# Patient Record
Sex: Male | Born: 1945 | Race: White | Hispanic: No | Marital: Married | Smoking: Never smoker
Health system: Southern US, Community
[De-identification: ages and names within clinical notes are randomized; demographics above are authoritative.]

## PROBLEM LIST (undated history)

## (undated) DIAGNOSIS — R160 Hepatomegaly, not elsewhere classified: Secondary | ICD-10-CM

## (undated) DIAGNOSIS — R55 Syncope and collapse: Secondary | ICD-10-CM

## (undated) DIAGNOSIS — R0602 Shortness of breath: Secondary | ICD-10-CM

## (undated) DIAGNOSIS — C801 Malignant (primary) neoplasm, unspecified: Secondary | ICD-10-CM

## (undated) DIAGNOSIS — IMO0001 Reserved for inherently not codable concepts without codable children: Secondary | ICD-10-CM

## (undated) DIAGNOSIS — IMO0002 Reserved for concepts with insufficient information to code with codable children: Secondary | ICD-10-CM

## (undated) DIAGNOSIS — I959 Hypotension, unspecified: Secondary | ICD-10-CM

## (undated) DIAGNOSIS — M1990 Unspecified osteoarthritis, unspecified site: Secondary | ICD-10-CM

## (undated) HISTORY — DX: Hypotension, unspecified: I95.9

## (undated) HISTORY — DX: Syncope and collapse: R55

---

## 1997-12-14 ENCOUNTER — Ambulatory Visit (HOSPITAL_COMMUNITY): Admission: RE | Admit: 1997-12-14 | Discharge: 1997-12-14 | Payer: Self-pay | Admitting: *Deleted

## 2012-04-21 ENCOUNTER — Inpatient Hospital Stay (HOSPITAL_COMMUNITY)
Admission: RE | Admit: 2012-04-21 | Discharge: 2012-04-26 | DRG: 481 | Disposition: A | Discharge status: Rehabilitation Facility | Payer: 59 | Source: Ambulatory Visit | Attending: Orthopedic Surgery | Admitting: Orthopedic Surgery

## 2012-04-21 ENCOUNTER — Inpatient Hospital Stay (HOSPITAL_COMMUNITY): Payer: 59

## 2012-04-21 ENCOUNTER — Other Ambulatory Visit: Payer: Self-pay | Admitting: Orthopedic Surgery

## 2012-04-21 DIAGNOSIS — C184 Malignant neoplasm of transverse colon: Secondary | ICD-10-CM | POA: Diagnosis present

## 2012-04-21 DIAGNOSIS — C7951 Secondary malignant neoplasm of bone: Secondary | ICD-10-CM | POA: Diagnosis present

## 2012-04-21 DIAGNOSIS — R718 Other abnormality of red blood cells: Secondary | ICD-10-CM | POA: Diagnosis present

## 2012-04-21 DIAGNOSIS — M84453A Pathological fracture, unspecified femur, initial encounter for fracture: Principal | ICD-10-CM | POA: Diagnosis present

## 2012-04-21 HISTORY — DX: Malignant (primary) neoplasm, unspecified: C80.1

## 2012-04-21 LAB — CBC WITH DIFFERENTIAL/PLATELET
Basophils Absolute: 0.1 10*3/uL (ref 0.0–0.1)
Eosinophils Relative: 0 % (ref 0–5)
HCT: 42.3 % (ref 39.0–52.0)
Hemoglobin: 13.7 g/dL (ref 13.0–17.0)
Hemoglobin: 12.6 g/dL — ABNORMAL LOW (ref 13.0–17.0)
Lymphocytes Relative: 11 % — ABNORMAL LOW (ref 12–46)
Lymphocytes Relative: 16 % (ref 12–46)
Lymphs Abs: 1.6 10*3/uL (ref 0.7–4.0)
MCV: 69.9 fL — ABNORMAL LOW (ref 78.0–100.0)
MCV: 70.1 fL — ABNORMAL LOW (ref 78.0–100.0)
Monocytes Absolute: 0.7 10*3/uL (ref 0.1–1.0)
Monocytes Relative: 7 % (ref 3–12)
Neutrophils Relative %: 73 % (ref 43–77)
Platelets: 281 10*3/uL (ref 150–400)
RBC: 5.56 MIL/uL (ref 4.22–5.81)
RDW: 18.8 % — ABNORMAL HIGH (ref 11.5–15.5)
WBC: 10.5 10*3/uL (ref 4.0–10.5)
WBC: 10.1 10*3/uL (ref 4.0–10.5)

## 2012-04-21 LAB — URINALYSIS, ROUTINE W REFLEX MICROSCOPIC
Glucose, UA: NEGATIVE mg/dL
Hgb urine dipstick: NEGATIVE
Ketones, ur: 40 mg/dL — AB
Protein, ur: NEGATIVE mg/dL

## 2012-04-21 LAB — COMPREHENSIVE METABOLIC PANEL
ALT: 14 U/L (ref 0–53)
AST: 21 U/L (ref 0–37)
Alkaline Phosphatase: 74 U/L (ref 39–117)
BUN: 20 mg/dL (ref 6–23)
CO2: 24 mEq/L (ref 19–32)
CO2: 25 mEq/L (ref 19–32)
Calcium: 10 mg/dL (ref 8.4–10.5)
Creatinine, Ser: 1.07 mg/dL (ref 0.50–1.35)
GFR calc Af Amer: 82 mL/min — ABNORMAL LOW (ref >90)
GFR calc Af Amer: >90 mL/min (ref >90)
GFR calc non Af Amer: 70 mL/min — ABNORMAL LOW (ref >90)
GFR calc non Af Amer: 85 mL/min — ABNORMAL LOW (ref >90)
Glucose, Bld: 96 mg/dL (ref 70–99)
Glucose, Bld: 92 mg/dL (ref 70–99)
Potassium: 3.7 mEq/L (ref 3.5–5.1)
Sodium: 138 mEq/L (ref 135–145)
Total Bilirubin: 0.6 mg/dL (ref 0.3–1.2)
Total Bilirubin: 0.6 mg/dL (ref 0.3–1.2)

## 2012-04-21 LAB — TYPE AND SCREEN
ABO/RH(D): A POS
Antibody Screen: NEGATIVE

## 2012-04-21 MED ORDER — POVIDONE-IODINE 7.5 % EX SOLN
Freq: Once | CUTANEOUS | Status: DC
  Filled 2012-04-21: qty 118

## 2012-04-21 MED ORDER — IOHEXOL 300 MG/ML  SOLN
100.0000 mL | Freq: Once | INTRAMUSCULAR | Status: AC | PRN
  Administered 2012-04-21: 100 mL via INTRAVENOUS

## 2012-04-21 MED ORDER — CEFAZOLIN SODIUM-DEXTROSE 2-3 GM-% IV SOLR
2.0000 g | INTRAVENOUS | Status: AC
  Administered 2012-04-22: 2 g via INTRAVENOUS
  Filled 2012-04-21: qty 50

## 2012-04-21 MED ORDER — SODIUM CHLORIDE 0.9 % IV SOLN: INTRAVENOUS | Status: DC

## 2012-04-21 MED ORDER — IOHEXOL 300 MG/ML  SOLN
20.0000 mL | INTRAMUSCULAR | Status: AC
  Administered 2012-04-21: 20 mL via ORAL

## 2012-04-21 MED ORDER — HYDROCODONE-ACETAMINOPHEN 5-325 MG PO TABS
1.0000 | ORAL_TABLET | Freq: Four times a day (QID) | ORAL | Status: DC | PRN
  Administered 2012-04-21: 1 via ORAL
  Filled 2012-04-21: qty 2

## 2012-04-21 MED ORDER — MORPHINE SULFATE 2 MG/ML IJ SOLN
0.5000 mg | INTRAMUSCULAR | Status: DC | PRN
  Administered 2012-04-21: 0.5 mg via INTRAVENOUS
  Filled 2012-04-21: qty 1

## 2012-04-21 NOTE — Progress Notes (Signed)
COURTESY NOTE: Oriented this 67 y/o [place] man and his wife and son to a possible diagnosis of myeloma. He understands the purpose of the surgery tomorrow is not to cure his cancer but to diagnose it and to fix his hip so he can walk without pain or with minimal pain. The lab work (SPEP and UPEP) and pathology likely will not be ready until weds. I will see the patient at that time and once a firm diagnosis is established I will complete the consult or refer to one of my partners who may sub-specialize in the patient's tumor type. Please let me know if I can be of help before then.

## 2012-04-21 NOTE — Progress Notes (Signed)
CARE MANAGEMENT NOTE 04/21/2012  Patient:  Justin Henson, Justin Henson   Account Number:  [id number]  Date Initiated:  04/21/2012  Documentation initiated by:  Vance Peper  Subjective/Objective Assessment:   67 yr old male adm for eval of right hip pain. Ortho MD and Oncologist will confer to determine plan of treatment.     Action/Plan:   CM will follow.  Status of service:  In process, will continue to follow

## 2012-04-21 NOTE — H&P (Signed)
PREOPERATIVE H&P  Chief Complaint: right hip pain  HPI: Justin Henson is a 67 y.o. male who presents for evaluation of right hip pain. It has been present for about 6 weeks and has been worsening. the patient was evaluated in my office about 6 weeks ago with significant right hip pain.  The x-rays showed an avulsion of the lesser trochanter and there was concern that this was causing his pain.  After 6 weeks of continued and ongoing pain and failure of his pain to resolve, the patient underwent MRI examination of the right hip.  The MRI examination unfortunately shows a large metastatic lesion in the proximal right femur.  There is also significant other lesions in the pelvis.  I had a long discussion with the oncologist and we both felt that giving the impending nature of fracture for this lesion that the patient should be admitted to undergo workup for his tumor and prophylactic nailing of the right hip. Pain is rated as severe.  No past medical history on file. No past surgical history on file. History   Social History  . Marital Status: Married    Spouse Name: N/A    Number of Children: N/A  . Years of Education: N/A   Social History Main Topics  . Smoking status: Not on file  . Smokeless tobacco: Not on file  . Alcohol Use: Not on file  . Drug Use: Not on file  . Sexually Active: Not on file   Other Topics Concern  . Not on file   Social History Narrative  . No narrative on file   No family history on file. Allergies not on file Prior to Admission medications   Not on File     Positive ROS: ROS: I have reviewed the patient's review of systems thoroughly and there are no positive responses as relates to the HPI.  All other systems have been reviewed and were otherwise negative with the exception of those mentioned in the HPI and as above.  Physical Exam: Filed Vitals:   04/21/12 1236  BP: 128/67  Pulse: 83  Temp: 98.2 F (36.8 C)  Resp: 18    General:  Alert, no acute distress Cardiovascular: No pedal edema Respiratory: No cyanosis, no use of accessory musculature GI: No organomegaly, abdomen is soft and non-tender Skin: No lesions in the area of chief complaint Neurologic: Sensation intact distally Psychiatric: Patient is competent for consent with normal mood and affect Lymphatic: No axillary or cervical lymphadenopathy  MUSCULOSKELETAL: painful rom r. Hip.  X-ray:plain x-ray shows a avulsion of lesser trochanter with a questionable permeative picture in the right proximal femur.  MRI right hip shows a large metastatic lesion in the proximal femur with other lesions in the iliac wings bilaterally.  Assessment/Plan: IMPENDING FRACTURE RIGHT HIP .  The patient has a new diagnosis of metastatic disease right hip.  He has an impending fracture of this right hip.  I discussed the case with the oncologist and we both feel that the most appropriate course of action will be admission and evaluation of his metastatic disease.  Once we are certain that he does not have a renal cell carcinoma we can proceed with intramedullary rodding right hip.  If we cannot make a  diagnosisbased on CT scan then we will proceed with IM rodding of right hip and plan to make a tissue diagnosis from the reamings.  If we're able to make the diagnosis and we will proceed with prophylactic nailing and  send the reamings as well for confirmation.  If it turns out to be a renal cell primary and he will need preoperative embolization. Plan for Procedure(s): INTRAMEDULLARY (IM) NAIL FEMORAL  The risks benefits and alternatives were discussed with the patient including but not limited to the risks of nonoperative treatment, versus surgical intervention including infection, bleeding, nerve injury, malunion, nonunion, hardware prominence, hardware failure, need for hardware removal, blood clots, cardiopulmonary complications, morbidity, mortality, among others, and they were willing  to proceed.  Predicted outcome is good, although there will be at least a six to nine month expected recovery.  Harvie Junior, MD 04/21/2012 1:45 PM

## 2012-04-22 ENCOUNTER — Inpatient Hospital Stay (HOSPITAL_COMMUNITY): Payer: 59 | Admitting: Anesthesiology

## 2012-04-22 ENCOUNTER — Encounter (HOSPITAL_COMMUNITY)
Admission: RE | Disposition: A | Discharge status: Rehabilitation Facility | Payer: Self-pay | Source: Ambulatory Visit | Attending: Orthopedic Surgery

## 2012-04-22 ENCOUNTER — Encounter (HOSPITAL_COMMUNITY): Payer: Self-pay | Admitting: Anesthesiology

## 2012-04-22 ENCOUNTER — Inpatient Hospital Stay (HOSPITAL_COMMUNITY): Payer: 59

## 2012-04-22 DIAGNOSIS — C801 Malignant (primary) neoplasm, unspecified: Secondary | ICD-10-CM

## 2012-04-22 DIAGNOSIS — M84453A Pathological fracture, unspecified femur, initial encounter for fracture: Principal | ICD-10-CM

## 2012-04-22 DIAGNOSIS — M25559 Pain in unspecified hip: Secondary | ICD-10-CM

## 2012-04-22 DIAGNOSIS — R718 Other abnormality of red blood cells: Secondary | ICD-10-CM

## 2012-04-22 HISTORY — PX: FEMUR IM NAIL: SHX1597

## 2012-04-22 LAB — SURGICAL PCR SCREEN: MRSA, PCR: NEGATIVE

## 2012-04-22 LAB — CEA: CEA: 1.7 ng/mL (ref 0.0–5.0)

## 2012-04-22 SURGERY — INSERTION, INTRAMEDULLARY ROD, FEMUR
Anesthesia: General | Site: Hip | Laterality: Right | Wound class: Clean

## 2012-04-22 MED ORDER — LACTATED RINGERS IV SOLN
INTRAVENOUS | Status: DC | PRN
  Administered 2012-04-22: 07:00:00 via INTRAVENOUS

## 2012-04-22 MED ORDER — DEXTROSE-NACL 5-0.45 % IV SOLN
INTRAVENOUS | Status: DC
  Administered 2012-04-22 – 2012-04-24 (×3): via INTRAVENOUS

## 2012-04-22 MED ORDER — HYDROCODONE-ACETAMINOPHEN 10-325 MG PO TABS
1.0000 | ORAL_TABLET | ORAL | Status: DC | PRN
  Administered 2012-04-22 (×3): 1 via ORAL
  Administered 2012-04-24: 2 via ORAL
  Filled 2012-04-22 (×2): qty 1
  Filled 2012-04-22 (×2): qty 2

## 2012-04-22 MED ORDER — ONDANSETRON HCL 4 MG PO TABS: 4.0000 mg | ORAL_TABLET | Freq: Four times a day (QID) | ORAL | Status: DC | PRN

## 2012-04-22 MED ORDER — FENTANYL CITRATE 0.05 MG/ML IJ SOLN
INTRAMUSCULAR | Status: DC | PRN
  Administered 2012-04-22 (×2): 75 ug via INTRAVENOUS
  Administered 2012-04-22: 100 ug via INTRAVENOUS

## 2012-04-22 MED ORDER — ALUM & MAG HYDROXIDE-SIMETH 200-200-20 MG/5ML PO SUSP: 30.0000 mL | ORAL | Status: DC | PRN

## 2012-04-22 MED ORDER — NEOSTIGMINE METHYLSULFATE 1 MG/ML IJ SOLN
INTRAMUSCULAR | Status: DC | PRN
  Administered 2012-04-22: 3 mg via INTRAVENOUS

## 2012-04-22 MED ORDER — ONDANSETRON HCL 4 MG/2ML IJ SOLN: 4.0000 mg | Freq: Once | INTRAMUSCULAR | Status: DC | PRN

## 2012-04-22 MED ORDER — ONDANSETRON HCL 4 MG/2ML IJ SOLN
4.0000 mg | Freq: Four times a day (QID) | INTRAMUSCULAR | Status: DC | PRN
  Administered 2012-04-26: 4 mg via INTRAVENOUS
  Filled 2012-04-22: qty 2

## 2012-04-22 MED ORDER — MIDAZOLAM HCL 5 MG/5ML IJ SOLN
INTRAMUSCULAR | Status: DC | PRN
  Administered 2012-04-22: 2 mg via INTRAVENOUS

## 2012-04-22 MED ORDER — ONDANSETRON HCL 4 MG/2ML IJ SOLN
INTRAMUSCULAR | Status: DC | PRN
  Administered 2012-04-22: 4 mg via INTRAVENOUS

## 2012-04-22 MED ORDER — LORATADINE 10 MG PO TABS
10.0000 mg | ORAL_TABLET | Freq: Every day | ORAL | Status: DC
  Filled 2012-04-22 (×3): qty 1

## 2012-04-22 MED ORDER — ENOXAPARIN SODIUM 40 MG/0.4ML ~~LOC~~ SOLN
40.0000 mg | SUBCUTANEOUS | Status: DC
  Administered 2012-04-23 – 2012-04-24 (×2): 40 mg via SUBCUTANEOUS
  Filled 2012-04-22 (×3): qty 0.4

## 2012-04-22 MED ORDER — HYDROMORPHONE HCL PF 1 MG/ML IJ SOLN
1.0000 mg | INTRAMUSCULAR | Status: DC | PRN
  Administered 2012-04-22 – 2012-04-24 (×4): 1 mg via INTRAVENOUS
  Filled 2012-04-22 (×3): qty 1
  Filled 2012-04-22: qty 2

## 2012-04-22 MED ORDER — METHOCARBAMOL 500 MG PO TABS
500.0000 mg | ORAL_TABLET | Freq: Four times a day (QID) | ORAL | Status: DC | PRN
  Administered 2012-04-22 – 2012-04-26 (×3): 500 mg via ORAL
  Filled 2012-04-22 (×3): qty 1

## 2012-04-22 MED ORDER — PHENYLEPHRINE HCL 10 MG/ML IJ SOLN
INTRAMUSCULAR | Status: DC | PRN
  Administered 2012-04-22: 80 ug via INTRAVENOUS

## 2012-04-22 MED ORDER — ARTIFICIAL TEARS OP OINT
TOPICAL_OINTMENT | OPHTHALMIC | Status: DC | PRN
  Administered 2012-04-22: 1 via OPHTHALMIC

## 2012-04-22 MED ORDER — SODIUM CHLORIDE 0.9 % IV SOLN: INTRAVENOUS | Status: DC

## 2012-04-22 MED ORDER — GLYCOPYRROLATE 0.2 MG/ML IJ SOLN
INTRAMUSCULAR | Status: DC | PRN
  Administered 2012-04-22: 0.4 mg via INTRAVENOUS

## 2012-04-22 MED ORDER — HYDROMORPHONE HCL PF 1 MG/ML IJ SOLN
INTRAMUSCULAR | Status: AC
  Administered 2012-04-23: 1 mg via INTRAVENOUS
  Filled 2012-04-22: qty 2

## 2012-04-22 MED ORDER — PROPOFOL 10 MG/ML IV BOLUS
INTRAVENOUS | Status: DC | PRN
  Administered 2012-04-22: 160 mg via INTRAVENOUS

## 2012-04-22 MED ORDER — FERROUS SULFATE 325 (65 FE) MG PO TABS
325.0000 mg | ORAL_TABLET | Freq: Two times a day (BID) | ORAL | Status: DC
  Administered 2012-04-22 – 2012-04-24 (×4): 325 mg via ORAL
  Filled 2012-04-22 (×7): qty 1

## 2012-04-22 MED ORDER — ROCURONIUM BROMIDE 100 MG/10ML IV SOLN
INTRAVENOUS | Status: DC | PRN
  Administered 2012-04-22: 50 mg via INTRAVENOUS

## 2012-04-22 MED ORDER — CEFAZOLIN SODIUM-DEXTROSE 2-3 GM-% IV SOLR
2.0000 g | Freq: Four times a day (QID) | INTRAVENOUS | Status: AC
Start: 2012-04-22 — End: 2012-04-22
  Administered 2012-04-22 (×2): 2 g via INTRAVENOUS
  Filled 2012-04-22 (×2): qty 50

## 2012-04-22 MED ORDER — ZOLPIDEM TARTRATE 5 MG PO TABS
5.0000 mg | ORAL_TABLET | Freq: Every evening | ORAL | Status: DC | PRN
Start: 2012-04-22 — End: 2012-04-26

## 2012-04-22 MED ORDER — ACETAMINOPHEN 10 MG/ML IV SOLN
1000.0000 mg | Freq: Once | INTRAVENOUS | Status: AC | PRN
  Administered 2012-04-22: 1000 mg via INTRAVENOUS

## 2012-04-22 MED ORDER — OXYCODONE HCL 5 MG PO TABS
5.0000 mg | ORAL_TABLET | ORAL | Status: DC | PRN
  Administered 2012-04-22: 10 mg via ORAL
  Filled 2012-04-22: qty 2

## 2012-04-22 MED ORDER — LORATADINE-PSEUDOEPHEDRINE ER 10-240 MG PO TB24: 1.0000 | ORAL_TABLET | Freq: Every day | ORAL | Status: DC

## 2012-04-22 MED ORDER — HYDROMORPHONE HCL PF 1 MG/ML IJ SOLN
0.2500 mg | INTRAMUSCULAR | Status: DC | PRN
  Administered 2012-04-22 (×4): 0.5 mg via INTRAVENOUS

## 2012-04-22 MED ORDER — DOCUSATE SODIUM 100 MG PO CAPS
100.0000 mg | ORAL_CAPSULE | Freq: Two times a day (BID) | ORAL | Status: DC
  Administered 2012-04-22 – 2012-04-26 (×5): 100 mg via ORAL
  Filled 2012-04-22 (×9): qty 1

## 2012-04-22 MED ORDER — MIDAZOLAM HCL 2 MG/2ML IJ SOLN: 2.0000 mg | Freq: Once | INTRAMUSCULAR | Status: DC

## 2012-04-22 MED ORDER — LIDOCAINE HCL (CARDIAC) 20 MG/ML IV SOLN
INTRAVENOUS | Status: DC | PRN
  Administered 2012-04-22: 100 mg via INTRAVENOUS

## 2012-04-22 MED ORDER — PSEUDOEPHEDRINE HCL ER 120 MG PO TB12
120.0000 mg | ORAL_TABLET | Freq: Two times a day (BID) | ORAL | Status: DC
  Administered 2012-04-22 – 2012-04-23 (×2): 120 mg via ORAL
  Filled 2012-04-22 (×7): qty 1

## 2012-04-22 MED ORDER — ACETAMINOPHEN 10 MG/ML IV SOLN
1000.0000 mg | Freq: Four times a day (QID) | INTRAVENOUS | Status: AC
  Administered 2012-04-22 – 2012-04-23 (×4): 1000 mg via INTRAVENOUS
  Filled 2012-04-22 (×4): qty 100

## 2012-04-22 MED ORDER — DEXTROSE 5 % IV SOLN
500.0000 mg | Freq: Four times a day (QID) | INTRAVENOUS | Status: DC | PRN
  Filled 2012-04-22 (×2): qty 5

## 2012-04-22 MED ORDER — DEXMEDETOMIDINE HCL IN NACL 200 MCG/50ML IV SOLN
0.4000 ug/kg/h | INTRAVENOUS | Status: DC
  Filled 2012-04-22: qty 50

## 2012-04-22 SURGICAL SUPPLY — 42 items
Affixus hip fracture nail right 130 13mmx440mm ×2 IMPLANT
BIT DRILL 4.3MMS DISTAL GRDTED (BIT) ×1 IMPLANT
BLADE SURG ROTATE 9660 (MISCELLANEOUS) ×2 IMPLANT
CLOTH BEACON ORANGE TIMEOUT ST (SAFETY) ×2 IMPLANT
CORTICAL BONE SCR 5.0MM X 48MM (Screw) ×2 IMPLANT
COVER SURGICAL LIGHT HANDLE (MISCELLANEOUS) ×4 IMPLANT
DECANTER SPIKE VIAL GLASS SM (MISCELLANEOUS) ×2 IMPLANT
DRAPE STERI IOBAN 125X83 (DRAPES) ×2 IMPLANT
DRILL 4.3MMS DISTAL GRADUATED (BIT) ×2
DRSG MEPILEX BORDER 4X4 (GAUZE/BANDAGES/DRESSINGS) ×2 IMPLANT
DRSG MEPILEX BORDER 4X8 (GAUZE/BANDAGES/DRESSINGS) ×2 IMPLANT
DURAPREP 26ML APPLICATOR (WOUND CARE) ×2 IMPLANT
ELECT CAUTERY BLADE 6.4 (BLADE) ×2 IMPLANT
ELECT REM PT RETURN 9FT ADLT (ELECTROSURGICAL) ×2
ELECTRODE REM PT RTRN 9FT ADLT (ELECTROSURGICAL) ×1 IMPLANT
EVACUATOR 1/8 PVC DRAIN (DRAIN) IMPLANT
GAUZE XEROFORM 5X9 LF (GAUZE/BANDAGES/DRESSINGS) ×2 IMPLANT
GLOVE BIOGEL PI IND STRL 8 (GLOVE) ×2 IMPLANT
GLOVE BIOGEL PI INDICATOR 8 (GLOVE) ×2
GLOVE ECLIPSE 7.5 STRL STRAW (GLOVE) ×4 IMPLANT
GOWN PREVENTION PLUS XLARGE (GOWN DISPOSABLE) ×2 IMPLANT
GOWN SRG XL XLNG 56XLVL 4 (GOWN DISPOSABLE) ×1 IMPLANT
GOWN STRL NON-REIN LRG LVL3 (GOWN DISPOSABLE) ×2 IMPLANT
GOWN STRL NON-REIN XL XLG LVL4 (GOWN DISPOSABLE) ×1
GUIDEWIRE BALL NOSE 80CM (WIRE) ×2 IMPLANT
HIP FR NAIL LAG SCREW 10.5X110 (Orthopedic Implant) ×2 IMPLANT
KIT BASIN OR (CUSTOM PROCEDURE TRAY) ×2 IMPLANT
KIT ROOM TURNOVER OR (KITS) ×2 IMPLANT
MANIFOLD NEPTUNE II (INSTRUMENTS) ×2 IMPLANT
NS IRRIG 1000ML POUR BTL (IV SOLUTION) ×2 IMPLANT
PACK GENERAL/GYN (CUSTOM PROCEDURE TRAY) ×2 IMPLANT
PAD ARMBOARD 7.5X6 YLW CONV (MISCELLANEOUS) ×4 IMPLANT
SCREW BONE CORTICAL 5.0X52 (Screw) ×2 IMPLANT
SCREW CORTICL BON 5.0MM X 48MM (Screw) ×1 IMPLANT
SCREW LAG HIP FR NAIL 10.5X110 (Orthopedic Implant) ×1 IMPLANT
STAPLER VISISTAT 35W (STAPLE) ×2 IMPLANT
SUT VIC AB 0 CTB1 27 (SUTURE) ×4 IMPLANT
SUT VIC AB 1 CTB1 27 (SUTURE) ×2 IMPLANT
SUT VIC AB 2-0 CTB1 (SUTURE) ×2 IMPLANT
TOWEL OR 17X24 6PK STRL BLUE (TOWEL DISPOSABLE) ×2 IMPLANT
TOWEL OR 17X26 10 PK STRL BLUE (TOWEL DISPOSABLE) ×2 IMPLANT
WATER STERILE IRR 1000ML POUR (IV SOLUTION) ×2 IMPLANT

## 2012-04-22 NOTE — Consult Note (Signed)
[facility]  [phone]    ONCOLOGY  HOSPITAL CONSULTATION NOTE  Justin Justin Henson                                MR#: [id number]  DOB: 27-Jan-1946                       CSN#: [id number]  Referring NW:[facility] Primary MD: Rozann Lesches  Reason for Consult: Pathologic Fracture   Justin Henson:QMVHQI E Justin Henson is a 67 y.o. [place]  male admitted on 04/21/2012 by Dr.John Graves, Orthopedics, after he presented with 6 weeks history of worsening R hip pain. Initial R hip x ray demonstrated lesser trochanter avulsion. Pain was not resolved, which prompted MRI of the R hip. This revealed a large metastatic lesion in the right proximal femur, along with other osseous lesions within the pelvis. He underwent R intramedullary femoral  nailing on 04/22/2012. Sample was sent for pathology which results are currently pending.  CT of the abdomen and pelvis on 04/21/2012 showed a large mass within the proximal transverse colon measuring  7.4 x 4.4 x 4.8 cm.  No obstruction. Again seen,  an expansile lytic lesion involving the proximal right femur at the level of the lesser trochanter of  4.8 x 4.3 x 5.5 cm. No additional suspicious bone lesions identified.   Small nonspecific pulmonary nodules measure up to 5 mm and a  Focal lytic lesion is identified within the manubrium of 1 cm . 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 Creatinine was 0.95   Alk P is 74.Ca was 9.5  PSA N/A . CEA 1.7    We were requested to see the patient with recommendations.  He has no complaint aside from the right leg pain.  PMH: History of basal cell carcinoma at the nose, s/p resection, remote.  Surgeries:  S/ IM prophylactic R femoral nail 04/21/2012 Dr. Margo Aye  Allergies: No Known Allergies  Medications:   Prior to Admission:  Prescriptions prior to admission    Medication Sig Dispense Refill  . loratadine-pseudoephedrine (CLARITIN-D 24-HOUR) 10-240 MG per 24 hr tablet Take 1 tablet by mouth daily.      Marland Kitchen oxyCODONE-acetaminophen (PERCOCET/ROXICET) 5-325 MG per tablet Take 1 tablet by mouth every 6 (six) hours as needed. For pain        ONG:EXBM & mag hydroxide-simeth, HYDROcodone-acetaminophen, HYDROmorphone (DILAUDID) injection, methocarbamol (ROBAXIN) IV, methocarbamol, ondansetron (ZOFRAN) IV, ondansetron, zolpidem  ROS: Constitutional: Positive for 23 lb weight loss due to decreased appetite over the last 2 months.. Negative for fever, chills or  night sweats.  Eyes: Negative for blurred vision and double vision.  Respiratory: Negative for productive cough. No hemoptysis.No hoarseness.No neck swelling.  Positive for shortness of breath on exertion. No pleuritic chest pain.  Cardiovascular: Negative for chest pain. No palpitations.  GI: Negative for  nausea, vomiting, diarrhea or constipation. No change in bowel caliber. No  Melena or hematochezia. Positive for  abdominal pain.  GU: Negative for hematuria. No loss of urinary control. No urinary retention.No changes in urine color.  Skin: Negative for itching. No rash. No petechia. No easy  Bruising. Musculoskeletal: as per HPI Neurological: No headaches.No confusion. No motor or sensory deficits. Psych: No depression. No suicidal thoughts.  Family History:    Mother alive with COPD Father alive, heart disease, history of skin cancer at the left face  No other family history of cancer     Social History: Married, one son in good health. Works as a Cytogeneticist. No tobacco or alcohol history. No risk factor for HIV or hepatitis. 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   Physical Exam    Filed Vitals:   04/22/12 1115  BP: 111/54  Pulse: 63  Temp:   Resp: 21      General: 67  -year-old  in no acute distress A. and O. x3  well-developed, well-nourished.   HEENT:  Normocephalic, atraumatic, PERRLA. Sclerae anicteric. Oral cavity without thrush or lesions. NECK:supple. no thyromegaly, no mass LUNGS: clear bilaterally . No wheezing, rhonchi or rales. No axillary masses. BREASTS: not examined. CARDIOVASCULAR: regular rate and rhythm, no murmur , rubs or gallops ABDOMEN: soft nontender, bowel sounds x4. No HSM. No masses palpable.  GU-testes without mass, slight fullness at the right epididymis EXTREMITIES: no clubbing cyanosis or edema. R hip s/p surgical nailing. Dressings dry NEURO: Non Focal. No Horner's.  Lymph nodes: No cervical, supraclavicular, axillary, or inguinal nodes  Skin: Multiple benign appearing moles over the trunk. Multiple skin tags in the axillae bilaterally   Labs:  CBC   Lab 04/21/12 1904 04/21/12 1306  WBC 10.1 10.5  HGB 12.6* 13.7  HCT 39.0 42.3  PLT 281 290  MCV 70.1* 69.9*  MCH 22.7* 22.6*  MCHC 32.3 32.4  RDW 18.6* 18.8*  LYMPHSABS 1.6 1.1  MONOABS 1.0 0.7  EOSABS 0.1 0.0  BASOSABS 0.0 0.1  BANDABS -- --     CMP    Lab 04/21/12 1904 04/21/12 1306  NA 138 139  K 3.7 3.8  CL 99 99  CO2 25 24  GLUCOSE 92 96  BUN 16 20  CREATININE 0.95 1.07  CALCIUM 9.5 10.0  MG -- --  AST 18 21  ALT 14 14  ALKPHOS 74 81  BILITOT 0.6 0.6   CEA 1.7 on 04/21/2012   Imaging Studies:   R femur1/10/2012  *RADIOLOGY REPORT*  Clinical Data: NAIL PLACEMENT.  RIGHT FEMUR - 2 VIEW,DG C-ARM 1-60 MIN  Comparison: MRI 04/18/2012  Findings: Multiple intraoperative spot images demonstrate placement of intermedullary nail and dynamic hip screw within the right femur.  No hardware or bony complicating feature.  IMPRESSION: Internal fixation as above.   Original Report Authenticated By: Charlett Nose, M.D.    Ct Chest W Contrast  04/21/2012  *RADIOLOGY REPORT*  Clinical Data:  Metastatic survey  CT CHEST, ABDOMEN AND PELVIS WITH CONTRAST  Technique:  Multidetector CT imaging of the chest, abdomen and pelvis was performed following the  standard protocol during bolus administration of intravenous contrast.  Contrast: OMNIPAQUE IOHEXOL 300 MG/ML  SOLN  Comparison:  04/18/2012  CT CHEST  Findings:  Lungs/pleura: Nonspecific subpleural nodule in the right upper lobe measures 5 mm, image 41. Parenchymal nodule in the right upper lobe measures 5 mm, image 33.  Left lower lobe subpleural nodule measures 5 mm, image 42.  Dependent changes are noted in both lung bases posteriorly.  Heart/Mediastinum: Normal heart size.  No pericardial effusion.  No mediastinal or hilar adenopathy.  Bones/Musculoskeletal:  Small lytic lesion is identified within the manubrium.  This measures approximately 1 cm, image 21/series 3. Lucent lesion within the T9 vertebra measures 10 mm, which is favored to represent a benign vertebral hemangioma.  IMPRESSION:  1.  Small nonspecific pulmonary nodules measure up to 5 mm. 2.  Focal lytic lesion is identified within the manubrium.  CT ABDOMEN AND PELVIS  Findings:  Mild diffuse low attenuation within the liver parenchyma identified.  No this suspicious liver abnormality noted.  1.1 cm stone noted within the gallbladder.  No biliary dilatation.  The pancreas appears normal.  Normal appearance of the spleen.  The adrenal glands are both normal.  Normal appearance of the kidneys.  The urinary bladder appears within normal limits. Prostate gland and seminal vesicles are unremarkable.  No enlarged lymph nodes within the upper abdomen.  The abdominal aorta has a normal caliber.  No pelvic or inguinal adenopathy identified.  No free fluid or fluid collections within the abdomen or pelvis. The stomach appears normal.  The small bowel loops have a normal course and caliber.  Normal appearance of the appendix.  Large mass within the proximal transverse colon is identified.  This measures 7.4 x 4.4 x 4.8 cm.  No obstruction.  Review of the visualized osseous structures again shows a expansile lytic lesion involving the proximal right  femur at the level of the lesser trochanter.  This measures approximately 4.8 x 4.3 x 5.5 cm. No additional suspicious bone lesions identified.  IMPRESSION:  1.  Mass within the proximal transverse colon is concerning for primary colonic neoplasm.  Correlation with colonoscopy is suggested. 2.  Expansile lytic lesion involving the proximal right femur is identified. This is favored to represent osseous metastasis. Primary bone neoplasm is not excluded but is considered less favored. 3.  Hepatic steatosis. 4.  Gallstone.   Original Report Authenticated By: Signa Kell, M.D.    Ct Abdomen Pelvis W Contrast  04/21/2012  *RADIOLOGY REPORT*  Clinical Data:  Metastatic survey  CT CHEST, ABDOMEN AND PELVIS WITH CONTRAST  Technique:  Multidetector CT imaging of the chest, abdomen and pelvis was performed following the standard protocol during bolus administration of intravenous contrast.  Contrast: OMNIPAQUE IOHEXOL 300 MG/ML  SOLN  Comparison:  04/18/2012  CT CHEST  Findings:  Lungs/pleura: Nonspecific subpleural nodule in the right upper lobe measures 5 mm, image 41. Parenchymal nodule in the right upper lobe measures 5 mm, image 33.  Left lower lobe subpleural nodule measures 5 mm, image 42.  Dependent changes are noted in both lung bases posteriorly.  Heart/Mediastinum: Normal heart size.  No pericardial effusion.  No mediastinal or hilar adenopathy.  Bones/Musculoskeletal:  Small lytic lesion is identified within the manubrium.  This measures approximately 1 cm, image 21/series 3. Lucent lesion within the T9 vertebra measures 10 mm, which is favored to represent a benign vertebral hemangioma.  IMPRESSION:  1.  Small nonspecific pulmonary nodules measure up to 5 mm. 2.  Focal lytic lesion is identified within the manubrium.  CT ABDOMEN AND PELVIS  Findings:  Mild diffuse low attenuation within the liver parenchyma identified.  No this suspicious liver abnormality noted.  1.1 cm stone noted within the  gallbladder.  No biliary dilatation.  The pancreas appears normal.  Normal appearance of the spleen.  The adrenal glands are both normal.  Normal appearance of the kidneys.  The urinary bladder appears within normal limits. Prostate gland and seminal vesicles are unremarkable.  No enlarged lymph nodes within the upper abdomen.  The abdominal aorta has a normal caliber.  No pelvic or inguinal adenopathy identified.  No free fluid or fluid collections within the abdomen or pelvis. The stomach appears normal.  The  small bowel loops have a normal course and caliber.  Normal appearance of the appendix.  Large mass within the proximal transverse colon is identified.  This measures 7.4 x 4.4 x 4.8 cm.  No obstruction.  Review of the visualized osseous structures again shows a expansile lytic lesion involving the proximal right femur at the level of the lesser trochanter.  This measures approximately 4.8 x 4.3 x 5.5 cm. No additional suspicious bone lesions identified.  IMPRESSION:  1.  Mass within the proximal transverse colon is concerning for primary colonic neoplasm.  Correlation with colonoscopy is suggested. 2.  Expansile lytic lesion involving the proximal right femur is identified. This is favored to represent osseous metastasis. Primary bone neoplasm is not excluded but is considered less favored. 3.  Hepatic steatosis. 4.  Gallstone.   Original Report Authenticated By: Signa Kell, M.D.    Dg Chest Portable 1 View  04/21/2012  *RADIOLOGY REPORT*  Clinical Data: Preoperative respiratory evaluation.  PORTABLE CHEST - 1 VIEW  Comparison: None.  Findings: Right lung is clear.  There is some linear atelectasis or subtle scarring in the left midlung.  No edema or focal airspace consolidation. Cardiopericardial silhouette is at upper limits of normal for size. Imaged bony structures of the thorax are intact.  IMPRESSION: No acute cardiopulmonary findings.   Original Report Authenticated By: Kennith Center, M.D.    Dg  C-arm 1-60 Min  04/22/2012  *RADIOLOGY REPORT*  Clinical Data: NAIL PLACEMENT.  RIGHT FEMUR - 2 VIEW,DG C-ARM 1-60 MIN  Comparison: MRI 04/18/2012  Findings: Multiple intraoperative spot images demonstrate placement of intermedullary nail and dynamic hip screw within the right femur.  No hardware or bony complicating feature.  IMPRESSION: Internal fixation as above.   Original Report Authenticated By: Charlett Nose, M.D.       A/P: 67 y.o. male asked to see for evaluation of a pathologic R femoral fracture, s/p R femoral nailing with pending results.   Dr.Cresencio Reesor   is to see the patient following this consult with recommendations regarding diagnosis, treatment options and further workup studies. Addendum to this note to be written. Thank you for the referral.  Marlowe Kays E 04/22/2012 2:08 PM  I interviewed and examined Mr. Hargadon.  Impression: 1. Destructive lesion in the proximal right femur, status post intramedullary rodding of the right femur 04/23/2011  2. Staging CT evaluation confirming a transverse colon mass, multiple small lung nodules, a lytic lesion at the sternum, and an expansile lesion at the proximal right femur  3. Red cell microcytosis  4. Pain secondary to the right femur lesion  Mr. Lothrop presents with pain at the right femur. He appears to have a malignant process involving the proximal right femur and a staging CT evaluation is suggestive of a primary colonic neoplasm. The red cell microcytosis is consistent with iron deficiency.  The most likely diagnosis is metastatic colon cancer, though this is an unusual presentation for colon cancer. The differential diagnosis includes metastatic carcinoma from another site and a hematopoietic malignancy. He could also have synchronous primary tumors.  I discussed the differential diagnosis with Mr. Knaak and his wife. We will followup on the pathology report and make additional staging/treatment  recommendations.  Recommendations: 1. Radiation oncology consult-we have scheduled an appointment with Dr. Mitzi Hansen 2. Outpatient followup at the cancer Center will be scheduled for within the next few weeks.  Mancel Bale M.D.

## 2012-04-22 NOTE — Anesthesia Postprocedure Evaluation (Signed)
  Anesthesia Post-op Note  Patient: Justin Henson  Procedure(s) Performed: Procedure(s) (LRB) with comments: INTRAMEDULLARY (IM) NAIL FEMORAL (Right) - PROPHYLACTIC  IM ROD RIGHT FEMUR   Patient Location: PACU  Anesthesia Type:General  Level of Consciousness: awake, alert  and oriented  Airway and Oxygen Therapy: Patient Spontanous Breathing and Patient connected to nasal cannula oxygen  Post-op Pain: mild  Post-op Assessment: Post-op Vital signs reviewed and Patient's Cardiovascular Status Stable  Post-op Vital Signs: stable  Complications: No apparent anesthesia complications

## 2012-04-22 NOTE — Progress Notes (Signed)
Rehab Admissions Coordinator Note:  Patient was screened by Clois Dupes for appropriateness for an Inpatient Acute Rehab Consult.  Noted PT eval .At this time, we are recommending Inpatient Rehab consult.  Clois Dupes, RN 04/22/2012, 3:28 PM  I can be reached at [phone].

## 2012-04-22 NOTE — Transfer of Care (Signed)
Immediate Anesthesia Transfer of Care Note  Patient: Justin Henson  Procedure(s) Performed: Procedure(s) (LRB) with comments: INTRAMEDULLARY (IM) NAIL FEMORAL (Right) - PROPHYLACTIC  IM ROD RIGHT FEMUR   Patient Location: PACU  Anesthesia Type:General  Level of Consciousness: awake, alert  and oriented  Airway & Oxygen Therapy: Patient Spontanous Breathing and Patient connected to face mask oxygen  Post-op Assessment: Report given to PACU RN, Post -op Vital signs reviewed and stable and Patient moving all extremities X 4  Post vital signs: Reviewed and stable  Complications: No apparent anesthesia complications

## 2012-04-22 NOTE — Preoperative (Signed)
Beta Blockers   Reason not to administer Beta Blockers:Not Applicable 

## 2012-04-22 NOTE — Anesthesia Preprocedure Evaluation (Addendum)
Anesthesia Evaluation  Patient identified by MRN, date of birth, ID band Patient awake    Reviewed: Allergy & Precautions, H&P , NPO status , Patient's Chart, lab work & pertinent test results  Airway Mallampati: III      Dental  (+) Teeth Intact   Pulmonary  breath sounds clear to auscultation        Cardiovascular Rhythm:Regular Rate:Normal     Neuro/Psych    GI/Hepatic GERD-  Controlled,  Endo/Other    Renal/GU      Musculoskeletal   Abdominal   Peds  Hematology   Anesthesia Other Findings   Reproductive/Obstetrics                         Anesthesia Physical Anesthesia Plan  ASA: III  Anesthesia Plan: General   Post-op Pain Management:    Induction: Intravenous  Airway Management Planned: Oral ETT  Additional Equipment:   Intra-op Plan:   Post-operative Plan: Extubation in OR  Informed Consent: I have reviewed the patients History and Physical, chart, labs and discussed the procedure including the risks, benefits and alternatives for the proposed anesthesia with the patient or authorized representative who has indicated his/her understanding and acceptance.   Dental advisory given  Plan Discussed with: CRNA and Surgeon  Anesthesia Plan Comments:         Anesthesia Quick Evaluation

## 2012-04-22 NOTE — Progress Notes (Signed)
UR COMPLETED  

## 2012-04-22 NOTE — Progress Notes (Signed)
Subjective: Feeling OK under the circumstance.  Still with r. Hip pain w/o radiation.  No other pain in the extremities   Objective: Vital signs in last 24 hours: Temp:  [97.7 F (36.5 C)-98.5 F (36.9 C)] 97.7 F (36.5 C) (01/07 0714) Pulse Rate:  [83-92] 85  (01/07 0714) Resp:  [18-20] 20  (01/07 0714) BP: (128-150)/(67-78) 150/78 mmHg (01/07 0714) SpO2:  [96 %-98 %] 98 % (01/07 0714) Weight:  [107.502 kg (237 lb)] 107.502 kg (237 lb) (01/06 1236)  Intake/Output from previous day:   Intake/Output this shift:     Basename 04/21/12 1904 04/21/12 1306  HGB 12.6* 13.7    Basename 04/21/12 1904 04/21/12 1306  WBC 10.1 10.5  RBC 5.56 6.05*  HCT 39.0 42.3  PLT 281 290    Basename 04/21/12 1904 04/21/12 1306  NA 138 139  K 3.7 3.8  CL 99 99  CO2 25 24  BUN 16 20  CREATININE 0.95 1.07  GLUCOSE 92 96  CALCIUM 9.5 10.0    Basename 04/21/12 1904 04/21/12 1306  LABPT -- --  INR 1.07 1.01   Well-developed well-nourished patient in no acute distress. Alert and oriented x3 HEENT:within normal limits Cardiac: Regular rate and rhythm Pulmonary: Lungs clear to auscultation Abdomen: Soft and nontender.  Normal active bowel sounds  Musculoskeletal: (painful rom r. Lower ext)    Assessment/Plan: 67 yo male with large lesion in prox femur with CT showing likely colon metestatic disease.  I had a long discussion with the oncologists and they feel prophlactic nailing  With post op XRT is the best choice at this point.  Will proceed with this today   Patric Vanpelt L 04/22/2012, 7:28 AM

## 2012-04-22 NOTE — Brief Op Note (Signed)
04/21/2012 - 04/22/2012  9:04 AM  PATIENT:  Justin Henson  67 y.o. male  PRE-OPERATIVE DIAGNOSIS:  IMPENDING FRACTURE RIGHT HIP   POST-OPERATIVE DIAGNOSIS:  IMPENDING FRACTURE RIGHT HI  PROCEDURE:  Procedure(s) (LRB) with comments: INTRAMEDULLARY (IM) NAIL FEMORAL (Right) - PROPHYLACTIC  IM ROD RIGHT FEMUR   SURGEON:  Surgeon(s) and Role:    * Harvie Junior, MD - Primary  PHYSICIAN ASSISTANT:   ASSISTANTS: bethune   ANESTHESIA:   general  EBL:  Total I/O In: 1000 [I.V.:1000] Out: -   BLOOD ADMINISTERED:none  DRAINS: none   LOCAL MEDICATIONS USED:  NONE  SPECIMEN:  Source of Specimen:  reamings r femur  DISPOSITION OF SPECIMEN:  PATHOLOGY  COUNTS:  YES  TOURNIQUET:  * No tourniquets in log *  DICTATION: .Other Dictation: Dictation Number [id number]  PLAN OF CARE: Admit to inpatient   PATIENT DISPOSITION:  PACU - hemodynamically stable.   Delay start of Pharmacological VTE agent (>24hrs) due to surgical blood loss or risk of bleeding: no

## 2012-04-22 NOTE — Anesthesia Procedure Notes (Addendum)
Procedure Name: Intubation Date/Time: 04/22/2012 7:43 AM Performed by: Quentin Ore Pre-anesthesia Checklist: Patient identified, Emergency Drugs available, Suction available, Patient being monitored and Timeout performed Patient Re-evaluated:Patient Re-evaluated prior to inductionOxygen Delivery Method: Circle system utilized Preoxygenation: Pre-oxygenation with 100% oxygen Intubation Type: IV induction Ventilation: Mask ventilation without difficulty and Oral airway inserted - appropriate to patient size Laryngoscope Size: Mac and 4 Grade View: Grade III Tube type: Oral Tube size: 7.5 mm Number of attempts: 2 Airway Equipment and Method: Stylet Placement Confirmation: ETT inserted through vocal cords under direct vision,  positive ETCO2 and breath sounds checked- equal and bilateral Secured at: 22 cm Tube secured with: Tape Dental Injury: Injury to lip  Comments: 1st attempt with MAC 3 per Paula Compton, paramedic unsuccessful. Unable to visualize VC. Injury to top lip. 2nd attempt with MAC 4 per CRNA successful. +ETCO2 & BBS=.

## 2012-04-22 NOTE — Evaluation (Signed)
Physical Therapy Evaluation Patient Details Name: Justin Henson MRN: [id number] DOB: [birth year] Today's Date: 04/22/2012 Time: 4098-1191 PT Time Calculation (min): 32 min  PT Assessment / Plan / Recommendation Clinical Impression  pt presents with R hip fx with hip and pelvis mets, s/p IM nail.  pt very painful.  pt needs max encouragement for participation and repeats himself many times.  pt would benefit from CIR at D/C pending progress.      PT Assessment  Patient needs continued PT services    Follow Up Recommendations  CIR    Does the patient have the potential to tolerate intense rehabilitation      Barriers to Discharge None      Equipment Recommendations  None recommended by PT    Recommendations for Other Services OT consult;Rehab consult   Frequency Min 6X/week    Precautions / Restrictions Precautions Precautions: Fall Restrictions Weight Bearing Restrictions: Yes RLE Weight Bearing: Partial weight bearing RLE Partial Weight Bearing Percentage or Pounds: 50   Pertinent Vitals/Pain Very painful, but does not rate.        Mobility  Bed Mobility Bed Mobility: Supine to Sit;Sitting - Scoot to Edge of Bed;Sit to Supine Supine to Sit: 3: Mod assist;With rails Sitting - Scoot to Edge of Bed: 4: Min assist Sit to Supine: 1: +2 Total assist Sit to Supine: Patient Percentage: 50% Details for Bed Mobility Assistance: Max cueing for sequencing, safe technique, encouragement.   Transfers Transfers: Not assessed Ambulation/Gait Ambulation/Gait Assistance: Not tested (comment) Stairs: No Wheelchair Mobility Wheelchair Mobility: No    Shoulder Instructions     Exercises     PT Diagnosis: Abnormality of gait;Acute pain  PT Problem List: Decreased strength;Decreased activity tolerance;Decreased balance;Decreased mobility;Decreased knowledge of use of DME;Pain PT Treatment Interventions: DME instruction;Gait training;Stair training;Functional mobility  training;Therapeutic activities;Therapeutic exercise;Balance training;Patient/family education   PT Goals Acute Rehab PT Goals PT Goal Formulation: With patient Time For Goal Achievement: 05/06/12 Potential to Achieve Goals: Good Pt will go Supine/Side to Sit: with modified independence PT Goal: Supine/Side to Sit - Progress: Goal set today Pt will go Sit to Supine/Side: with modified independence PT Goal: Sit to Supine/Side - Progress: Goal set today Pt will go Sit to Stand: with modified independence PT Goal: Sit to Stand - Progress: Goal set today Pt will go Stand to Sit: with modified independence PT Goal: Stand to Sit - Progress: Goal set today Pt will Ambulate: >150 feet;with modified independence;with rolling walker PT Goal: Ambulate - Progress: Goal set today Pt will Go Up / Down Stairs: 1-2 stairs;with min assist;with least restrictive assistive device PT Goal: Up/Down Stairs - Progress: Goal set today Pt will Perform Home Exercise Program: with supervision, verbal cues required/provided PT Goal: Perform Home Exercise Program - Progress: Goal set today  Visit Information  Last PT Received On: 04/22/12 Assistance Needed: +2    Subjective Data  Subjective: You've got to be kidding.   Patient Stated Goal: Home   Prior Functioning  Home Living Lives With: Spouse;Son Available Help at Discharge: Family;Available 24 hours/day (Spouse 24/7, Son periodically.  ) Type of Home: House Home Access: Stairs to enter [employer] of Steps: 1 Home Layout: One level Bathroom Shower/Tub: Walk-in shower;Tub/shower unit Bathroom Toilet: Handicapped height Bathroom Accessibility: Yes How Accessible: Accessible via walker Home Adaptive Equipment: Walker - rolling;Crutches;Wheelchair - manual Prior Function Level of Independence: Independent Able to Take Stairs?: Yes Driving: Yes Communication Communication: No difficulties    Cognition  Overall Cognitive Status:  Appears within functional limits for tasks assessed/performed Arousal/Alertness: Awake/alert Orientation Level: Appears intact for tasks assessed Behavior During Session: [facility] for tasks performed    Extremity/Trunk Assessment Right Lower Extremity Assessment RLE ROM/Strength/Tone: Deficits RLE ROM/Strength/Tone Deficits: Very limited by pain.   RLE Sensation: WFL - Light Touch Left Lower Extremity Assessment LLE ROM/Strength/Tone: WFL for tasks assessed LLE Sensation: WFL - Light Touch   Balance Balance Balance Assessed: No  End of Session PT - End of Session Activity Tolerance: Patient limited by pain Patient left: in bed;with call bell/phone within reach Nurse Communication: Mobility status  GP     [name], [place] [phone] 04/22/2012, 3:13 PM

## 2012-04-23 ENCOUNTER — Encounter (HOSPITAL_COMMUNITY): Payer: Self-pay | Admitting: Orthopedic Surgery

## 2012-04-23 LAB — PROTEIN ELECTROPHORESIS, SERUM
Beta 2: 4.6 % (ref 3.2–6.5)
Gamma Globulin: 10.2 % — ABNORMAL LOW (ref 11.1–18.8)
M-Spike, %: NOT DETECTED g/dL

## 2012-04-23 LAB — CBC
HCT: 33 % — ABNORMAL LOW (ref 39.0–52.0)
Hemoglobin: 10.5 g/dL — ABNORMAL LOW (ref 13.0–17.0)
MCH: 22.4 pg — ABNORMAL LOW (ref 26.0–34.0)
MCHC: 31.8 g/dL (ref 30.0–36.0)
MCV: 70.4 fL — ABNORMAL LOW (ref 78.0–100.0)

## 2012-04-23 LAB — BASIC METABOLIC PANEL
BUN: 12 mg/dL (ref 6–23)
CO2: 26 mEq/L (ref 19–32)
Calcium: 8.7 mg/dL (ref 8.4–10.5)
Creatinine, Ser: 0.93 mg/dL (ref 0.50–1.35)
GFR calc non Af Amer: 86 mL/min — ABNORMAL LOW (ref >90)
Glucose, Bld: 110 mg/dL — ABNORMAL HIGH (ref 70–99)
Sodium: 136 mEq/L (ref 135–145)

## 2012-04-23 MED ORDER — ALPRAZOLAM 0.5 MG PO TABS
1.0000 mg | ORAL_TABLET | Freq: Four times a day (QID) | ORAL | Status: DC | PRN
  Administered 2012-04-23 – 2012-04-24 (×2): 1 mg via ORAL
  Filled 2012-04-23 (×2): qty 1
  Filled 2012-04-23: qty 2

## 2012-04-23 MED FILL — Midazolam HCl Inj 2 MG/2ML (Base Equivalent): INTRAMUSCULAR | Qty: 2 | Status: AC

## 2012-04-23 NOTE — Op Note (Signed)
NAMERAESHAWN, VO             ACCOUNT NO.:  [id number]  MEDICAL RECORD NO.:  [id number]  LOCATION:  5N09C                        FACILITY:  [facility]  PHYSICIAN:  Harvie Junior, M.D.   DATE OF BIRTH:  Nov 05, 1945  DATE OF PROCEDURE:  04/22/2012 DATE OF DISCHARGE:                              OPERATIVE REPORT   PREOPERATIVE DIAGNOSIS:  Impending fracture right femur with suspected metastatic colon cancer.  POSTOPERATIVE DIAGNOSIS:  Impending fracture right femur with suspected metastatic colon cancer.  PROCEDURE:  Intramedullary rodding of right femur with a Biomet troch entry nail 13 mm x 440 mm with a 110 mm proximal locking screw and 2 distal locking screws.  SURGEON:  Harvie Junior, MD  ASSISTANT:  Marshia Ly, PA.  ANESTHESIA:  General.  BRIEF HISTORY:  Mr. Glasscock is a 67 year old male with a history of having had significant right femur pain.  He had been treated conservatively with suspected lesser trochanteric fracture because of continued complaints of pain.  MRI was obtained, which showed a large suspected metastatic lesion in the proximal femur.  He was evaluated in the office and admitted him directly to the hospital, keep him nonweightbearing.  Consulted Oncology and they recommended a CAT scans of the abdomen and pelvis, and chest.  This showed a large lesion in the transverse colon, and no other significant lesions at that point was suspected colon cancer.  I again had a discussion with the oncologist but we felt given that he had metastatic colon cancer.  The appropriate action would be to get his hip stabilized and then could radiate the hip and he was brought to the operating room for this procedure.  PROCEDURE:  The patient was brought to the operating room and once the adequate anesthesia was obtained, the patient was placed supine on the operating table.  He was then put in position for intramedullary rodding.  At this time, after routine prep and  drape, a small incision was made just proximal to the trochanter and subcutaneous tissue down the level of the trochanter.  A guidewire was placed in the tip of the trochanter, and noted to be central to the femur in both AP and lateral. Once this was done, it was over-reamed and starting at this point reamings were sent.  We put a curette down just to assess the bone.  It seemed to be intact all the way around, although we were unable to get any additional cells at that point, we took all of the reamings.  We sent reamers down to 15 and then put a 13-mm rod and 13 x 440 mm.  When we are putting the rod, we did feel a crack at the proximal femur and x- ray at that point showed it did look like a fracture or impending fracture completed at that point, we essentially had the rod and at that point, so it was sort of a nonissue.  At any rate, we then locked it proximally with 110 mm locking screw and we locked this into the rod and then distally put 2 interlocks.  At this point, the wound was copiously irrigated.  Again, all the reamings were collected out of the wound  and sent to Pathology to help with the diagnosis.  Once this was completed, the wound was irrigated, suctioned dry, closed in layers. Sterile compressive dressing was applied, and the patient taken to recovery room.  He is noted to be in satisfactory condition.  Estimated blood loss for the procedure was 200 mL.     Harvie Junior, M.D.     Ranae Plumber  D:  04/22/2012  T:  04/23/2012  Job:  [id number]

## 2012-04-23 NOTE — Progress Notes (Signed)
Physical Therapy Treatment Patient Details Name: Justin Henson MRN: [id number] DOB: 05-31-1945 Today's Date: 04/23/2012 Time: 4098-1191 PT Time Calculation (min): 47 min  PT Assessment / Plan / Recommendation Comments on Treatment Session  pt rpesents with R hip fx s/p IM nail with Metastatic lesions.  pt extremely limited by pain.  pt with high anxiety and panic attack during transfer.  ?if pt needs higher strangth pain meds vs anxiety meds.      Follow Up Recommendations  CIR     Does the patient have the potential to tolerate intense rehabilitation     Barriers to Discharge        Equipment Recommendations  None recommended by PT    Recommendations for Other Services OT consult;Rehab consult  Frequency Min 6X/week   Plan Discharge plan remains appropriate;Frequency remains appropriate    Precautions / Restrictions Precautions Precautions: Fall Restrictions Weight Bearing Restrictions: Yes RLE Weight Bearing: Partial weight bearing RLE Partial Weight Bearing Percentage or Pounds: 50   Pertinent Vitals/Pain Does not rate, but pt yelling and very high anxiety.      Mobility  Bed Mobility Bed Mobility: Supine to Sit;Sitting - Scoot to Edge of Bed Supine to Sit: 1: +2 Total assist Supine to Sit: Patient Percentage: 40% Sitting - Scoot to Edge of Bed: 3: Mod assist Details for Bed Mobility Assistance: Max cueing for sequencing and safe technique.  pt becomes very anxious and needs re-directions often.   Transfers Transfers: Sit to Stand;Stand to Sit;Stand Pivot Transfers Sit to Stand: 1: +2 Total assist;With upper extremity assist;From bed Sit to Stand: Patient Percentage: 40% Stand to Sit: 1: +2 Total assist;To chair/3-in-1 Stand to Sit: Patient Percentage: 10% Stand Pivot Transfers: 1: +2 Total assist Stand Pivot Transfers: Patient Percentage: 20% Details for Transfer Assistance: Strong cueing for safe technique and attending to task.  pt seemed to have a panic  attack during transfer and required heavy A to complete transfer.   Ambulation/Gait Ambulation/Gait Assistance: Not tested (comment) Stairs: No Wheelchair Mobility Wheelchair Mobility: No    Exercises     PT Diagnosis:    PT Problem List:   PT Treatment Interventions:     PT Goals Acute Rehab PT Goals Time For Goal Achievement: 05/06/12 Potential to Achieve Goals: Good PT Goal: Supine/Side to Sit - Progress: Progressing toward goal PT Goal: Sit to Stand - Progress: Progressing toward goal PT Goal: Stand to Sit - Progress: Progressing toward goal  Visit Information  Last PT Received On: 04/23/12 Assistance Needed: +2    Subjective Data  Subjective: You're going to kill me!     Cognition  Overall Cognitive Status: Difficult to assess Difficult to assess due to:  (High Anxiety ) Arousal/Alertness: Awake/alert Orientation Level: Appears intact for tasks assessed Behavior During Session: Anxious Cognition - Other Comments: pt extremely anxious and seems to have a panic attack while transfering to chair.      Balance  Balance Balance Assessed: No  End of Session PT - End of Session Equipment Utilized During Treatment: Gait belt Activity Tolerance: Patient limited by pain Patient left: in chair;with call bell/phone within reach;with family/visitor present Nurse Communication: Mobility status   GP     Justin Henson, [place] [phone] 04/23/2012, 9:38 AM

## 2012-04-23 NOTE — Progress Notes (Signed)
Subjective: 1 Day Post-Op Procedure(s) (LRB): INTRAMEDULLARY (IM) NAIL FEMORAL (Right) Patient reports pain as 5 on 0-10 scale.   Taking po/voiding ok.  Objective: Vital signs in last 24 hours: Temp:  [97.5 F (36.4 C)-98.4 F (36.9 C)] 98 F (36.7 C) (01/08 0910) Pulse Rate:  [63-122] 122  (01/08 0910) Resp:  [16-21] 16  (01/08 0910) BP: (98-114)/(51-67) 98/59 mmHg (01/08 0910) SpO2:  [93 %-99 %] 96 % (01/08 0910)  Intake/Output from previous day: 01/07 0701 - 01/08 0700 In: 2470 [P.O.:360; I.V.:2110] Out: 600 [Blood:600] Intake/Output this shift:     Basename 04/23/12 0615 04/21/12 1904 04/21/12 1306  HGB 10.5* 12.6* 13.7    Basename 04/23/12 0615 04/21/12 1904  WBC 11.2* 10.1  RBC 4.69 5.56  HCT 33.0* 39.0  PLT 207 281    Basename 04/23/12 0615 04/21/12 1904  NA 136 138  K 3.4* 3.7  CL 99 99  CO2 26 25  BUN 12 16  CREATININE 0.93 0.95  GLUCOSE 110* 92  CALCIUM 8.7 9.5    Basename 04/21/12 1904 04/21/12 1306  LABPT -- --  INR 1.07 1.01  Right hip exam:  Neurovascular intact Sensation intact distally Intact pulses distally Dorsiflexion/Plantar flexion intact Incision: dressing C/D/I Compartment soft  Assessment/Plan: 1 Day Post-Op Procedure(s) (LRB): INTRAMEDULLARY (IM) NAIL FEMORAL (Right) Pathologic fx right proximal femur Plan: Up with therapy 50% WB on Right leg Lovenox for DVT prophylaxis Medical tx per Oncology.  Jeanita Carneiro G 04/23/2012, 11:14 AM

## 2012-04-23 NOTE — Evaluation (Signed)
Occupational Therapy Evaluation Patient Details Name: Justin Henson MRN: [id number] DOB: March 03, 1946 Today's Date: 04/23/2012 Time: 7829-5621 OT Time Calculation (min): 30 min  OT Assessment / Plan / Recommendation Clinical Impression  This 67 year old man was admitted for impending fx of R femur.  He has a h/o metastic colon CA and underwent IM Nail with PWB (50%).  Pt is appropriate for skilled OT to increase independence with adls/functional bathroom transfers.      OT Assessment  Patient needs continued OT Services    Follow Up Recommendations  CIR    Barriers to Discharge      Equipment Recommendations  3 in 1 bedside comode    Recommendations for Other Services Rehab consult  Frequency  Min 2X/week    Precautions / Restrictions Precautions Precautions: Fall Restrictions RLE Weight Bearing: Partial weight bearing RLE Partial Weight Bearing Percentage or Pounds: 50   Pertinent Vitals/Pain Pt did not rate pain but had moderate/severe pain with movement; he was premedicated prior to OT's arrival.  Also experienced a cramp in R thigh while I was present.  Repositioned in bed    ADL  Grooming: Simulated;Supervision/safety Where Assessed - Grooming: Supported sitting Upper Body Bathing: Simulated;Supervision/safety Where Assessed - Upper Body Bathing: Supported sitting Lower Body Bathing: Simulated;+2 Total assistance Lower Body Bathing: Patient Percentage: 30% Where Assessed - Lower Body Bathing: Supported sit to stand Upper Body Dressing: Simulated;Minimal assistance (iv) Where Assessed - Upper Body Dressing: Supported sitting Lower Body Dressing: Simulated;+2 Total assistance Lower Body Dressing: Patient Percentage: 10% Where Assessed - Lower Body Dressing: Supported sit to Pharmacist, hospital: Simulated;+2 Total assistance Toilet Transfer: Patient Percentage: 50% (50% sit to stand; 70% for pivot to bed) Toilet Transfer Method: Stand pivot (to L) Education administrator:  (chair to bed) Toileting - Architect and Hygiene: Simulated;+2 Total assistance Toileting - Clothing Manipulation and Hygiene: Patient Percentage: 20% Where Assessed - Toileting Clothing Manipulation and Hygiene: Sit to stand from 3-in-1 or toilet Equipment Used: Gait belt;Rolling walker Transfers/Ambulation Related to ADLs: Pt takes extra time to complete any mobility due to pain.  He had towel under R thigh to help move it.  It took 4 attempts to stand.  Pt would push up a little and need to sit back down.  Pt did 50% of sit to stand ADL Comments: Did not introduce AE this visit, but introduced concept.  Pt is too painful to use on R side, but he would benefit from using on L.  Pt was very focused on pain and needed redirection back to task.      OT Diagnosis: Generalized weakness;Acute pain  OT Problem List: Decreased strength;Decreased activity tolerance;Pain;Decreased knowledge of use of DME or AE;Decreased knowledge of precautions;Decreased cognition (attention limited by focus on pain/procedure) OT Treatment Interventions: Self-care/ADL training;DME and/or AE instruction;Balance training;Patient/family education;Therapeutic activities   OT Goals Acute Rehab OT Goals OT Goal Formulation: With patient/family Time For Goal Achievement: 04/30/12 Potential to Achieve Goals: Good ADL Goals Pt Will Transfer to Toilet: with 2+ total assist;Stand pivot transfer;3-in-1;Maintaining weight bearing status (pt 70% including sit to stand with mod cues) ADL Goal: Toilet Transfer - Progress: Goal set today Miscellaneous OT Goals Miscellaneous OT Goal #1: Pt will go sit to stand, A x 2 pt 70% and maintain for 2 minutes for adls with min A OT Goal: Miscellaneous Goal #1 - Progress: Goal set today Miscellaneous OT Goal #2: Pt will use AE on LLE with min cues (will progress to  RLE as pain becomes controlled) OT Goal: Miscellaneous Goal #2 - Progress: Goal set today  Visit  Information  Last OT Received On: 04/23/12 Assistance Needed: +2    Subjective Data  Subjective: Let me do it Patient Stated Goal: pain to decrease   Prior Functioning     Home Living Lives With: Spouse;Son Available Help at Discharge: Family;Available 24 hours/day Type of Home: House Home Access: Stairs to enter [employer] of Steps: 1 Home Layout: One level Bathroom Shower/Tub: Walk-in shower;Tub/shower unit Bathroom Toilet: Handicapped height Bathroom Accessibility: Yes How Accessible: Accessible via walker Home Adaptive Equipment: Walker - rolling;Crutches;Wheelchair - manual Prior Function Level of Independence: Independent Able to Take Stairs?: Yes Driving: Yes Communication Communication: No difficulties         Vision/Perception     Cognition  Overall Cognitive Status: Impaired Area of Impairment: Attention (focused on pain/procedure; cues to stay on task) Arousal/Alertness: Awake/alert Orientation Level: Appears intact for tasks assessed Behavior During Session: Anxious    Extremity/Trunk Assessment Right Upper Extremity Assessment RUE ROM/Strength/Tone: Within functional levels Left Upper Extremity Assessment LUE ROM/Strength/Tone: Within functional levels     Mobility Transfers Sit to Stand: 1: +2 Total assist;With upper extremity assist;From chair/3-in-1 Sit to Stand: Patient Percentage: 50% Stand to Sit: 1: +2 Total assist;To bed Stand to Sit: Patient Percentage: 60% Details for Transfer Assistance: max cues for hand and LE placement     Shoulder Instructions     Exercise     Balance     End of Session OT - End of Session Activity Tolerance: Patient limited by pain Patient left: in bed;with call bell/phone within reach;with nursing in room  GO     Danna Casella 04/23/2012, 1:33 PM Marica Otter, OTR/L [phone] 04/23/2012

## 2012-04-23 NOTE — Progress Notes (Signed)
Orthopedic Tech Progress Note Patient Details:  Justin Henson Dec 23, 1945 [id number]  Patient ID: Justin Henson, male   DOB: [birth year], 67 y.o.   MRN: [id number]   Justin Henson 04/23/2012, 9:40 AM PATIENTS BED WILL NOT ACCOMMODATE OVERHEAD FRAME.

## 2012-04-23 NOTE — Progress Notes (Addendum)
After transferring from the bed to the chair, with therapy, patient began yelling and using profanity at approximately 0850.  He stated he was hurting a 15/10 in his right lower abdomen and that he "ripped a muscle;" patient exhibits severe anxiety.  No signs of injury noted to right lower quadrant of abdomen.  He states it is different from incision pain.  Patient keeps repeating and yelling that he needs the pain gone, he is not receptive to staff requests to reposition him in the chair.  Patient receives some relief from anxiety when his wife arrives; patient given IV dilaudid at [phone].  Patient calms down slightly after pain medication but is requesting additional medication.  Marshia Ly, PA is notified of patient's vitals, complaint of pain in right lower quadrant, and the patient's response to the initial dose of dilaudid.  New orders received for xanax PRN.  Will continue to monitor.

## 2012-04-23 NOTE — Consult Note (Signed)
Physical Medicine and Rehabilitation Consult Reason for Consult: Right femur fracture Referring Physician: Dr. Luiz Blare   HPI: NATION CRADLE is a 67 y.o. right-handed male with unremarkable past medical history. Patient was independent prior to admission. Admitted 04/21/2012 with right hip pain x6 weeks. He was evaluated initially in the orthopedic office and x-ray showed an avulsion of the lesser trochanter and there was concern that this was causing his pain. 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 There was also significant other lesions in the pelvis. Patient was admitted on 04/21/2012 and underwent intramedullary nail 04/22/2012 per Dr. Luiz Blare. Advise 50 pound partial weightbearing to right lower extremity. Maintained on subcutaneous Lovenox for DVT prophylaxis. Followup oncology services Dr. Myrle Sheng and await path followed report and make additional staging treatment recommendations and followup outpatient. Physical and occupational therapy evaluations completed and ongoing with recommendations of physical medicine rehabilitation consult to consider inpatient rehabilitation services   Review of Systems  Constitutional: Negative for fever and chills.  HENT: Negative for hearing loss.   Cardiovascular: Negative for chest pain and palpitations.  Genitourinary: Negative for dysuria and urgency.  Musculoskeletal: Positive for myalgias and joint pain.  Neurological: Negative for headaches.  Endo/Heme/Allergies: Does not bruise/bleed easily.  Psychiatric/Behavioral:       Panic attacks  All other systems reviewed and are negative.   No past medical history on file. No past surgical history on file. No family history on file. Social History:  does not have a smoking history on file. He does not have any smokeless tobacco history on file. His alcohol and drug histories not on  file. Allergies: No Known Allergies Medications Prior to Admission  Medication Sig Dispense Refill  . loratadine-pseudoephedrine (CLARITIN-D 24-HOUR) 10-240 MG per 24 hr tablet Take 1 tablet by mouth daily.      Marland Kitchen oxyCODONE-acetaminophen (PERCOCET/ROXICET) 5-325 MG per tablet Take 1 tablet by mouth every 6 (six) hours as needed. For pain        Home: Home Living Lives With: Spouse;Son Available Help at Discharge: Family;Available 24 hours/day Type of Home: House Home Access: Stairs to enter [employer] of Steps: 1 Home Layout: One level Bathroom Shower/Tub: Walk-in shower;Tub/shower unit Bathroom Toilet: Handicapped height Bathroom Accessibility: Yes How Accessible: Accessible via walker Home Adaptive Equipment: Walker - rolling;Crutches;Wheelchair - manual  Functional History: Prior Function Able to Take Stairs?: Yes Driving: Yes Functional Status:  Mobility: Bed Mobility Bed Mobility: Supine to Sit;Sitting - Scoot to Edge of Bed Supine to Sit: 1: +2 Total assist Supine to Sit: Patient Percentage: 40% Sitting - Scoot to Edge of Bed: 3: Mod assist Sit to Supine: 1: +2 Total assist Sit to Supine: Patient Percentage: 50% Transfers Transfers: Sit to Stand;Stand to Sit;Stand Pivot Transfers Sit to Stand: 1: +2 Total assist;With upper extremity assist;From chair/3-in-1 Sit to Stand: Patient Percentage: 50% Stand to Sit: 1: +2 Total assist;To bed Stand to Sit: Patient Percentage: 60% Stand Pivot Transfers: 1: +2 Total assist Stand Pivot Transfers: Patient Percentage: 20% Ambulation/Gait Ambulation/Gait Assistance: Not tested (comment) Stairs: No Wheelchair Mobility Wheelchair Mobility: No  ADL: ADL Grooming: Simulated;Supervision/safety Where Assessed - Grooming: Supported sitting Upper Body Bathing: Simulated;Supervision/safety Where Assessed - Upper Body Bathing: Supported sitting Lower Body Bathing: Simulated;+2 Total assistance Where Assessed - Lower  Body Bathing: Supported sit to stand Upper Body Dressing: Simulated;Minimal assistance (iv) Where Assessed - Upper Body Dressing: Supported sitting Lower Body Dressing: Simulated;+2  Total assistance Where Assessed - Lower Body Dressing: Supported sit to stand Toilet Transfer: Simulated;+2 Total assistance Toilet Transfer Method: Stand pivot (to L) Acupuncturist:  (chair to bed) Equipment Used: Gait belt;Rolling walker Transfers/Ambulation Related to ADLs: Pt takes extra time to complete any mobility due to pain.  He had towel under R thigh to help move it.  It took 4 attempts to stand.  Pt would push up a little and need to sit back down.  Pt did 50% of sit to stand ADL Comments: Did not introduce AE this visit, but introduced concept.  Pt is too painful to use on R side, but he would benefit from using on L.  Pt was very focused on pain and needed redirection back to task.    Cognition: Cognition Arousal/Alertness: Awake/alert Orientation Level: Oriented X4 Cognition Overall Cognitive Status: Impaired Area of Impairment: Attention (focused on pain/procedure; cues to stay on task) Difficult to assess due to:  (High Anxiety ) Arousal/Alertness: Awake/alert Orientation Level: Appears intact for tasks assessed Behavior During Session: Anxious Cognition - Other Comments: pt extremely anxious and seems to have a panic attack while transfering to chair.    Blood pressure 126/65, pulse 120, temperature 98.3 F (36.8 C), temperature source Oral, resp. rate 18, height 6\' 3"  (1.905 m), weight 107.502 kg (237 lb), SpO2 94.00%. Physical Exam  Vitals reviewed. Constitutional: He is oriented to person, place, and time. He appears well-developed.  HENT:  Head: Normocephalic.  Eyes:       Pupils round and reactive to light  Neck: Neck supple. No thyromegaly present.  Cardiovascular: Normal rate and regular rhythm.   Pulmonary/Chest: Effort normal and breath sounds normal. He has no  wheezes.  Abdominal: Bowel sounds are normal. He exhibits no distension. There is no tenderness. There is no rebound.  Musculoskeletal: He exhibits no edema.       Pain along right inguinal area, likely along RF or iliopsoas. Worse with HF. Right hip wound dressed  Neurological: He is alert and oriented to person, place, and time. He has normal reflexes. He displays normal reflexes. No cranial nerve deficit. He exhibits normal muscle tone. Coordination normal.       Follows full commands. A bit tangential but appropriate. Other than pain inhibition in right leg (which is major) motor and sensory exam intact  Skin:       Surgical site clean and dry  Psychiatric: He has a normal mood and affect.    Results for orders placed during the hospital encounter of 04/21/12 (from the past 24 hour(s))  CBC     Status: Abnormal   Collection Time   04/23/12  6:15 AM      Component Value Range   WBC 11.2 (*) 4.0 - 10.5 K/uL   RBC 4.69  4.22 - 5.81 MIL/uL   Hemoglobin 10.5 (*) 13.0 - 17.0 g/dL   HCT 19.1 (*) 47.8 - 29.5 %   MCV 70.4 (*) 78.0 - 100.0 fL   MCH 22.4 (*) 26.0 - 34.0 pg   MCHC 31.8  30.0 - 36.0 g/dL   RDW 62.1 (*) 30.8 - 65.7 %   Platelets 207  150 - 400 K/uL  BASIC METABOLIC PANEL     Status: Abnormal   Collection Time   04/23/12  6:15 AM      Component Value Range   Sodium 136  135 - 145 mEq/L   Potassium 3.4 (*) 3.5 - 5.1 mEq/L   Chloride 99  96 - 112 mEq/L   CO2 26  19 - 32 mEq/L   Glucose, Bld 110 (*) 70 - 99 mg/dL   BUN 12  6 - 23 mg/dL   Creatinine, Ser 1.19  0.50 - 1.35 mg/dL   Calcium 8.7  8.4 - 14.7 mg/dL   GFR calc non Af Amer 86 (*) >90 mL/min   GFR calc Af Amer >90  >90 mL/min   Dg Femur Right  04/22/2012  *RADIOLOGY REPORT*  Clinical Data: NAIL PLACEMENT.  RIGHT FEMUR - 2 VIEW,DG C-ARM 1-60 MIN  Comparison: MRI 04/18/2012  Findings: Multiple intraoperative spot images demonstrate placement of intermedullary nail and dynamic hip screw within the right femur.  No hardware  or bony complicating feature.  IMPRESSION: Internal fixation as above.   Original Report Authenticated By: Charlett Nose, M.D.    Ct Chest W Contrast  04/21/2012  *RADIOLOGY REPORT*  Clinical Data:  Metastatic survey  CT CHEST, ABDOMEN AND PELVIS WITH CONTRAST  Technique:  Multidetector CT imaging of the chest, abdomen and pelvis was performed following the standard protocol during bolus administration of intravenous contrast.  Contrast: OMNIPAQUE IOHEXOL 300 MG/ML  SOLN  Comparison:  04/18/2012  CT CHEST  Findings:  Lungs/pleura: Nonspecific subpleural nodule in the right upper lobe measures 5 mm, image 41. Parenchymal nodule in the right upper lobe measures 5 mm, image 33.  Left lower lobe subpleural nodule measures 5 mm, image 42.  Dependent changes are noted in both lung bases posteriorly.  Heart/Mediastinum: Normal heart size.  No pericardial effusion.  No mediastinal or hilar adenopathy.  Bones/Musculoskeletal:  Small lytic lesion is identified within the manubrium.  This measures approximately 1 cm, image 21/series 3. Lucent lesion within the T9 vertebra measures 10 mm, which is favored to represent a benign vertebral hemangioma.  IMPRESSION:  1.  Small nonspecific pulmonary nodules measure up to 5 mm. 2.  Focal lytic lesion is identified within the manubrium.  CT ABDOMEN AND PELVIS  Findings:  Mild diffuse low attenuation within the liver parenchyma identified.  No this suspicious liver abnormality noted.  1.1 cm stone noted within the gallbladder.  No biliary dilatation.  The pancreas appears normal.  Normal appearance of the spleen.  The adrenal glands are both normal.  Normal appearance of the kidneys.  The urinary bladder appears within normal limits. Prostate gland and seminal vesicles are unremarkable.  No enlarged lymph nodes within the upper abdomen.  The abdominal aorta has a normal caliber.  No pelvic or inguinal adenopathy identified.  No free fluid or fluid collections within the abdomen or  pelvis. The stomach appears normal.  The small bowel loops have a normal course and caliber.  Normal appearance of the appendix.  Large mass within the proximal transverse colon is identified.  This measures 7.4 x 4.4 x 4.8 cm.  No obstruction.  Review of the visualized osseous structures again shows a expansile lytic lesion involving the proximal right femur at the level of the lesser trochanter.  This measures approximately 4.8 x 4.3 x 5.5 cm. No additional suspicious bone lesions identified.  IMPRESSION:  1.  Mass within the proximal transverse colon is concerning for primary colonic neoplasm.  Correlation with colonoscopy is suggested. 2.  Expansile lytic lesion involving the proximal right femur is identified. This is favored to represent osseous metastasis. Primary bone neoplasm is not excluded but is considered less favored. 3.  Hepatic steatosis. 4.  Gallstone.   Original Report Authenticated By: Signa Kell, M.D.  Ct Abdomen Pelvis W Contrast  04/21/2012  *RADIOLOGY REPORT*  Clinical Data:  Metastatic survey  CT CHEST, ABDOMEN AND PELVIS WITH CONTRAST  Technique:  Multidetector CT imaging of the chest, abdomen and pelvis was performed following the standard protocol during bolus administration of intravenous contrast.  Contrast: OMNIPAQUE IOHEXOL 300 MG/ML  SOLN  Comparison:  04/18/2012  CT CHEST  Findings:  Lungs/pleura: Nonspecific subpleural nodule in the right upper lobe measures 5 mm, image 41. Parenchymal nodule in the right upper lobe measures 5 mm, image 33.  Left lower lobe subpleural nodule measures 5 mm, image 42.  Dependent changes are noted in both lung bases posteriorly.  Heart/Mediastinum: Normal heart size.  No pericardial effusion.  No mediastinal or hilar adenopathy.  Bones/Musculoskeletal:  Small lytic lesion is identified within the manubrium.  This measures approximately 1 cm, image 21/series 3. Lucent lesion within the T9 vertebra measures 10 mm, which is favored to  represent a benign vertebral hemangioma.  IMPRESSION:  1.  Small nonspecific pulmonary nodules measure up to 5 mm. 2.  Focal lytic lesion is identified within the manubrium.  CT ABDOMEN AND PELVIS  Findings:  Mild diffuse low attenuation within the liver parenchyma identified.  No this suspicious liver abnormality noted.  1.1 cm stone noted within the gallbladder.  No biliary dilatation.  The pancreas appears normal.  Normal appearance of the spleen.  The adrenal glands are both normal.  Normal appearance of the kidneys.  The urinary bladder appears within normal limits. Prostate gland and seminal vesicles are unremarkable.  No enlarged lymph nodes within the upper abdomen.  The abdominal aorta has a normal caliber.  No pelvic or inguinal adenopathy identified.  No free fluid or fluid collections within the abdomen or pelvis. The stomach appears normal.  The small bowel loops have a normal course and caliber.  Normal appearance of the appendix.  Large mass within the proximal transverse colon is identified.  This measures 7.4 x 4.4 x 4.8 cm.  No obstruction.  Review of the visualized osseous structures again shows a expansile lytic lesion involving the proximal right femur at the level of the lesser trochanter.  This measures approximately 4.8 x 4.3 x 5.5 cm. No additional suspicious bone lesions identified.  IMPRESSION:  1.  Mass within the proximal transverse colon is concerning for primary colonic neoplasm.  Correlation with colonoscopy is suggested. 2.  Expansile lytic lesion involving the proximal right femur is identified. This is favored to represent osseous metastasis. Primary bone neoplasm is not excluded but is considered less favored. 3.  Hepatic steatosis. 4.  Gallstone.   Original Report Authenticated By: Signa Kell, M.D.    Dg C-arm 1-60 Min  04/22/2012  *RADIOLOGY REPORT*  Clinical Data: NAIL PLACEMENT.  RIGHT FEMUR - 2 VIEW,DG C-ARM 1-60 MIN  Comparison: MRI 04/18/2012  Findings: Multiple  intraoperative spot images demonstrate placement of intermedullary nail and dynamic hip screw within the right femur.  No hardware or bony complicating feature.  IMPRESSION: Internal fixation as above.   Original Report Authenticated By: Charlett Nose, M.D.     Assessment/Plan: Diagnosis: Pathological right femur fx, s/p IMN//right hip flexor strain 1. Does the need for close, 24 hr/day medical supervision in concert with the patient's rehab needs make it unreasonable for this patient to be served in a less intensive setting? Yes 2. Co-Morbidities requiring supervision/potential complications: anxiety, pain, cancer of unknown primary 3. Due to bladder management, bowel management, safety, skin/wound care, disease management, medication administration,  pain management and patient education, does the patient require 24 hr/day rehab nursing? Yes 4. Does the patient require coordinated care of a physician, rehab nurse, PT (1-2 hrs/day, 5 days/week) and OT (1-2 hrs/day, 5 days/week) to address physical and functional deficits in the context of the above medical diagnosis(es)? Yes Addressing deficits in the following areas: balance, endurance, locomotion, strength, transferring, bowel/bladder control, bathing, dressing, feeding, grooming, toileting and psychosocial support 5. Can the patient actively participate in an intensive therapy program of at least 3 hrs of therapy per day at least 5 days per week? Yes 6. The potential for patient to make measurable gains while on inpatient rehab is excellent 7. Anticipated functional outcomes upon discharge from inpatient rehab are mod I to supervision with PT, mod I to supervision with OT, n/a with SLP. 8. Estimated rehab length of stay to reach the above functional goals is: 7-10 days 9. Does the patient have adequate social supports to accommodate these discharge functional goals? Yes 10. Anticipated D/C setting: Home 11. Anticipated post D/C treatments: HH  therapy 12. Overall Rehab/Functional Prognosis: excellent  RECOMMENDATIONS: This patient's condition is appropriate for continued rehabilitative care in the following setting: CIR Patient has agreed to participate in recommended program. Potentially Note that insurance prior authorization may be required for reimbursement for recommended care.  Comment:Rehab RN to follow up.   Ivory Broad, MD    04/23/2012

## 2012-04-24 ENCOUNTER — Inpatient Hospital Stay (HOSPITAL_COMMUNITY): Payer: 59

## 2012-04-24 ENCOUNTER — Ambulatory Visit
Admit: 2012-04-24 | Discharge: 2012-04-24 | Disposition: A | Discharge status: Home / Self Care | Payer: 59 | Attending: Radiation Oncology | Admitting: Radiation Oncology

## 2012-04-24 ENCOUNTER — Encounter (HOSPITAL_COMMUNITY): Payer: Self-pay | Admitting: General Practice

## 2012-04-24 ENCOUNTER — Telehealth: Payer: Self-pay | Admitting: Oncology

## 2012-04-24 DIAGNOSIS — M84453A Pathological fracture, unspecified femur, initial encounter for fracture: Secondary | ICD-10-CM

## 2012-04-24 DIAGNOSIS — C801 Malignant (primary) neoplasm, unspecified: Secondary | ICD-10-CM

## 2012-04-24 HISTORY — DX: Malignant (primary) neoplasm, unspecified: C80.1

## 2012-04-24 LAB — CBC
HCT: 32.9 % — ABNORMAL LOW (ref 39.0–52.0)
MCH: 22.4 pg — ABNORMAL LOW (ref 26.0–34.0)
MCH: 22.3 pg — ABNORMAL LOW (ref 26.0–34.0)
MCHC: 31.9 g/dL (ref 30.0–36.0)
MCHC: 31.6 g/dL (ref 30.0–36.0)
MCV: 70.2 fL — ABNORMAL LOW (ref 78.0–100.0)
MCV: 70.4 fL — ABNORMAL LOW (ref 78.0–100.0)
Platelets: 179 10*3/uL (ref 150–400)
Platelets: 214 10*3/uL (ref 150–400)
RDW: 19.1 % — ABNORMAL HIGH (ref 11.5–15.5)
RDW: 19.2 % — ABNORMAL HIGH (ref 11.5–15.5)

## 2012-04-24 LAB — UIFE/LIGHT CHAINS/TP QN, 24-HR UR
Alpha 2, Urine: NOT DETECTED
Beta, Urine: NOT DETECTED
Free Lambda Lt Chains,Ur: 0.11 mg/dL (ref 0.02–0.67)
Gamma Globulin, Urine: NOT DETECTED

## 2012-04-24 LAB — BASIC METABOLIC PANEL
Calcium: 8.6 mg/dL (ref 8.4–10.5)
Creatinine, Ser: 0.76 mg/dL (ref 0.50–1.35)
GFR calc non Af Amer: >90 mL/min (ref >90)
Sodium: 137 mEq/L (ref 135–145)

## 2012-04-24 MED ORDER — HYDROCODONE-ACETAMINOPHEN 10-325 MG PO TABS: 1.0000 | ORAL_TABLET | ORAL | Status: DC | PRN

## 2012-04-24 MED ORDER — ALPRAZOLAM 0.5 MG PO TABS: 0.5000 mg | ORAL_TABLET | Freq: Two times a day (BID) | ORAL | Status: DC | PRN

## 2012-04-24 MED ORDER — SENNA 8.6 MG PO TABS: 1.0000 | ORAL_TABLET | Freq: Every day | ORAL | Status: DC

## 2012-04-24 MED ORDER — ACETAMINOPHEN 325 MG PO TABS: 325.0000 mg | ORAL_TABLET | ORAL | Status: DC | PRN

## 2012-04-24 MED ORDER — PEG 3350-KCL-NA BICARB-NACL 420 G PO SOLR
4000.0000 mL | Freq: Once | ORAL | Status: AC
  Administered 2012-04-24: 4000 mL via ORAL
  Filled 2012-04-24: qty 4000

## 2012-04-24 MED ORDER — OXYCODONE-ACETAMINOPHEN 5-325 MG PO TABS: 1.0000 | ORAL_TABLET | Freq: Four times a day (QID) | ORAL | Status: DC | PRN

## 2012-04-24 MED ORDER — ENOXAPARIN SODIUM 40 MG/0.4ML ~~LOC~~ SOLN
40.0000 mg | SUBCUTANEOUS | Status: DC
  Filled 2012-04-24: qty 0.4

## 2012-04-24 MED ORDER — ONDANSETRON HCL 4 MG PO TABS: 4.0000 mg | ORAL_TABLET | Freq: Four times a day (QID) | ORAL | Status: DC | PRN

## 2012-04-24 MED ORDER — ONDANSETRON HCL 4 MG/2ML IJ SOLN: 4.0000 mg | Freq: Four times a day (QID) | INTRAMUSCULAR | Status: DC | PRN

## 2012-04-24 MED ORDER — LORATADINE 10 MG PO TABS
10.0000 mg | ORAL_TABLET | Freq: Every day | ORAL | Status: DC
  Filled 2012-04-24 (×2): qty 1

## 2012-04-24 MED ORDER — MAGNESIUM CITRATE PO SOLN
1.0000 | Freq: Once | ORAL | Status: AC
Start: 2012-04-24 — End: 2012-04-24
  Administered 2012-04-24: 1 via ORAL
  Filled 2012-04-24: qty 296

## 2012-04-24 MED ORDER — PSEUDOEPHEDRINE HCL ER 120 MG PO TB12
120.0000 mg | ORAL_TABLET | Freq: Two times a day (BID) | ORAL | Status: DC
  Filled 2012-04-24 (×5): qty 1

## 2012-04-24 MED ORDER — SODIUM CHLORIDE 0.9 % IV SOLN: INTRAVENOUS | Status: DC

## 2012-04-24 MED ORDER — SODIUM CHLORIDE 0.9 % IV SOLN
INTRAVENOUS | Status: DC
  Administered 2012-04-24: 19:00:00 via INTRAVENOUS

## 2012-04-24 MED ORDER — ALUM & MAG HYDROXIDE-SIMETH 200-200-20 MG/5ML PO SUSP: 30.0000 mL | ORAL | Status: DC | PRN

## 2012-04-24 MED ORDER — SORBITOL 70 % SOLN: 30.0000 mL | Freq: Every day | Status: DC | PRN

## 2012-04-24 MED ORDER — POLYETHYLENE GLYCOL 3350 17 G PO PACK: 17.0000 g | PACK | Freq: Every day | ORAL | Status: DC | PRN

## 2012-04-24 MED ORDER — HYDROMORPHONE HCL PF 1 MG/ML IJ SOLN
1.0000 mg | INTRAMUSCULAR | Status: DC | PRN
  Administered 2012-04-24: 1 mg via INTRAVENOUS
  Filled 2012-04-24: qty 1

## 2012-04-24 MED ORDER — SENNA 8.6 MG PO TABS
1.0000 | ORAL_TABLET | Freq: Two times a day (BID) | ORAL | Status: DC
  Administered 2012-04-26: 8.6 mg via ORAL
  Filled 2012-04-24 (×5): qty 1

## 2012-04-24 MED ORDER — ENOXAPARIN SODIUM 40 MG/0.4ML ~~LOC~~ SOLN: 30.0000 mg | Freq: Every day | SUBCUTANEOUS | Status: DC

## 2012-04-24 MED ORDER — METHOCARBAMOL 500 MG PO TABS: 500.0000 mg | ORAL_TABLET | Freq: Four times a day (QID) | ORAL | Status: DC | PRN

## 2012-04-24 NOTE — Progress Notes (Signed)
Physical Therapy Treatment Patient Details Name: Justin Henson MRN: [id number] DOB: 1945/10/15 Today's Date: 04/24/2012 Time: 6295-2841 PT Time Calculation (min): 55 min  PT Assessment / Plan / Recommendation Comments on Treatment Session  pt presents with R hip fx s/p IM nail and Metastatic Lesions.  pt extremely painful in anterior of R groin/ hip.  Spoke with RN and Ortho MD after session about source of pain being ? from Mets and ? how aggressive therapy needed to be based on prognosis for CA.  Would appreciate Oncology's input on plan of care and how agressive they feel therapy should be.  Will continue to follow.      Follow Up Recommendations  CIR     Does the patient have the potential to tolerate intense rehabilitation     Barriers to Discharge        Equipment Recommendations  None recommended by PT    Recommendations for Other Services    Frequency Min 6X/week   Plan Discharge plan remains appropriate;Frequency remains appropriate    Precautions / Restrictions Precautions Precautions: Fall Restrictions Weight Bearing Restrictions: Yes RLE Weight Bearing: Partial weight bearing RLE Partial Weight Bearing Percentage or Pounds: 50   Pertinent Vitals/Pain Indicates R anterior groin/hip extremely painful.  Unable to extend hip without extreme pain.      Mobility  Bed Mobility Bed Mobility: Supine to Sit;Sitting - Scoot to Edge of Bed;Sit to Supine Supine to Sit: 4: Min assist;With rails;HOB elevated Sitting - Scoot to Edge of Bed: 5: Supervision Sit to Supine: 4: Min assist;HOB elevated;With rail Details for Bed Mobility Assistance: pt takes ~ 8-75mins to complete task. cues to facilitate trunk movement, but not receptive to cues for movement of R LE.   Transfers Transfers: Not assessed Details for Transfer Assistance: pt sat at EOB ~84mins struggling to use UEs to clear bottom off of bed and refused to allow PT or Rehab Tech A him with coming to stand.  pt seems  very fearful about pain and is trying everything he can to avoid pain in his anterior R groin/hip.   Ambulation/Gait Ambulation/Gait Assistance: Not tested (comment) Stairs: No Wheelchair Mobility Wheelchair Mobility: No    Exercises     PT Diagnosis:    PT Problem List:   PT Treatment Interventions:     PT Goals Acute Rehab PT Goals Time For Goal Achievement: 05/06/12 Potential to Achieve Goals: Good PT Goal: Supine/Side to Sit - Progress: Progressing toward goal PT Goal: Sit to Supine/Side - Progress: Progressing toward goal  Visit Information  Last PT Received On: 04/24/12 Assistance Needed: +2    Subjective Data  Subjective: Don't touch me, let me do this on my own.     Cognition  Overall Cognitive Status: Appears within functional limits for tasks assessed/performed Arousal/Alertness: Awake/alert Orientation Level: Appears intact for tasks assessed Behavior During Session: Anxious Cognition - Other Comments: pt with anxiety better controlled today on Xanax, however still quite anxious and fearful of pain.      Balance  Balance Balance Assessed: No  End of Session PT - End of Session Activity Tolerance: Patient limited by pain Patient left: in bed;with call bell/phone within reach;with family/visitor present Nurse Communication: Mobility status   GP     Justin Henson, Alison Murray 04/24/2012, 1:15 PM

## 2012-04-24 NOTE — PMR Pre-admission (Addendum)
PMR Admission Coordinator Pre-Admission Assessment  Patient: Justin Henson is an 67 y.o., male MRN: [id number] DOB: 27-Jul-1945 Height: 6\' 3"  (190.5 cm) Weight: 107.502 kg (237 lb)            Insurance Information  PPO: yes PRIMARY: [facility]      Policy#: [id number]      Subscriber: [name] CM Name: Shon Hale      Phone#: [phone]     Fax#:  Pre-Cert#: [id number]      Employer: full-time Benefits:  Phone #: [phone]     Name: Tacy Dura. Date: 05/17/06     Deduct: 0      Out of Pocket Max: $2500      Life Max: none CIR: $100 admission ded., 80/20%      SNF: 100% $0 copay Outpatient: 100%     Co-Pay: 0 Home Health:  100%      Co-Pay: 0 DME: 80/20% Providers: in network  SECONDARY: Medicare      Policy#: [id number]      Subscriber: self  Emergency Contact Information Contact Information    Name Relation Home Work Mobile   Horice Carrero son   [phone]   KYLO, GAVIN  [phone]       Current Medical History  Patient Admitting Diagnosis: Pathological right femur fx, s/p IMN//right hip flexor strain  History of Present Illness:A 67 y.o. right-handed male with unremarkable past medical history. Patient was independent prior to admission. Admitted 04/21/2012 with right hip pain x 6 weeks. He was evaluated initially in the orthopedic office and x-ray showed an avulsion of the lesser trochanter and there was concern that this was causing his pain. 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 There was also significant other lesions in the pelvis. Patient was admitted on 04/21/2012 and underwent intramedullary nail 04/22/2012 per Dr. Luiz Blare. Advise 50 pound partial weightbearing to right lower extremity. Maintained on subcutaneous Lovenox for DVT prophylaxis. Followup oncology services Dr. Myrle Sheng and await path followed report and make additional staging treatment recommendations and followup  outpatient.  Colonoscopy done 01/10 with findings of a large mass.  Depending on path report may need endoscopy next week.  Admission to inpatient rehab held 01/09 and 01/10 due to procedure.  Patient very sleepy post procedure.  Planning admit to inpatient rehab for 04/26/12.  Past Medical History  Past Medical History  Diagnosis Date  . Cancer 04/24/2012    lesions currently under diagnosis    Family History  family history is not on file.  Prior Rehab/Hospitalizations: none   Current Medications  Current facility-administered medications:ALPRAZolam (XANAX) tablet 1 mg, 1 mg, Oral, Q6H PRN, Matthew Folks, PA, 1 mg at 04/24/12 1050;  alum & mag hydroxide-simeth (MAALOX/MYLANTA) 200-200-20 MG/5ML suspension 30 mL, 30 mL, Oral, Q4H PRN, Matthew Folks, PA;  dexmedetomidine (PRECEDEX) 200 MCG/50ML infusion, 0.4-1.2 mcg/kg/hr, Intravenous, Titrated, Harvie Junior, MD dextrose 5 %-0.45 % sodium chloride infusion, , Intravenous, Continuous, Matthew Folks, PA, Last Rate: 75 mL/hr at 04/24/12 1316;  docusate sodium (COLACE) capsule 100 mg, 100 mg, Oral, BID, Matthew Folks, PA, 100 mg at 04/24/12 8756;  enoxaparin (LOVENOX) injection 40 mg, 40 mg, Subcutaneous, Q24H, Matthew Folks, PA, 40 mg at 04/24/12 4332;  ferrous sulfate tablet 325 mg, 325 mg, Oral, BID WC, Matthew Folks, PA, 325 mg at 04/24/12 0954 HYDROcodone-acetaminophen (NORCO) 10-325 MG per tablet 1-2 tablet, 1-2  tablet, Oral, Q4H PRN, Harvie Junior, MD, 2 tablet at 04/24/12 0831;  HYDROmorphone (DILAUDID) injection 1-2 mg, 1-2 mg, Intravenous, Q2H PRN, Matthew Folks, PA, 1 mg at 04/24/12 1057;  loratadine (CLARITIN) tablet 10 mg, 10 mg, Oral, Daily, Harvie Junior, MD;  methocarbamol (ROBAXIN) 500 mg in dextrose 5 % 50 mL IVPB, 500 mg, Intravenous, Q6H PRN, Matthew Folks, PA methocarbamol (ROBAXIN) tablet 500 mg, 500 mg, Oral, Q6H PRN, Matthew Folks, PA, 500 mg at 04/22/12 2019;  midazolam (VERSED) injection 2 mg, 2 mg,  Intravenous, Once, Kipp Brood, MD;  ondansetron [facility]) injection 4 mg, 4 mg, Intravenous, Q6H PRN, Matthew Folks, PA;  ondansetron [facility]) tablet 4 mg, 4 mg, Oral, Q6H PRN, Matthew Folks, PA pseudoephedrine (SUDAFED) 12 hr tablet 120 mg, 120 mg, Oral, BID, Harvie Junior, MD, 120 mg at 04/23/12 2336;  zolpidem (AMBIEN) tablet 5 mg, 5 mg, Oral, QHS PRN, Matthew Folks, PA  Patients Current Diet: General  Precautions / Restrictions Precautions Precautions: Fall Restrictions Weight Bearing Restrictions: Yes RLE Weight Bearing: Partial weight bearing RLE Partial Weight Bearing Percentage or Pounds: 50   Prior Activity Level Community (5-7x/wk): Very active daily  Journalist, newspaper / Equipment Home Assistive Devices/Equipment: None Home Adaptive Equipment: Walker - rolling;Crutches;Wheelchair - manual  Prior Functional Level Prior Function Level of Independence: Independent Able to Take Stairs?: Yes Driving: Yes Vocation: Full time employment Counselling psychologist)  Current Functional Level Cognition  Arousal/Alertness: Awake/alert Overall Cognitive Status: Appears within functional limits for tasks assessed/performed Difficult to assess due to:  (High Anxiety ) Orientation Level: Oriented X4 Cognition - Other Comments: pt with anxiety better controlled today on Xanax, however still quite anxious and fearful of pain.      Extremity Assessment (includes Sensation/Coordination)  RUE ROM/Strength/Tone: Within functional levels  RLE ROM/Strength/Tone: Deficits RLE ROM/Strength/Tone Deficits: Very limited by pain.   RLE Sensation: WFL - Light Touch    ADLs  Grooming: Simulated;Supervision/safety Where Assessed - Grooming: Supported sitting Upper Body Bathing: Simulated;Supervision/safety Where Assessed - Upper Body Bathing: Supported sitting Lower Body Bathing: Simulated;+2 Total assistance Lower Body Bathing: Patient Percentage: 30% Where Assessed - Lower Body  Bathing: Supported sit to stand Upper Body Dressing: Simulated;Minimal assistance (iv) Where Assessed - Upper Body Dressing: Supported sitting Lower Body Dressing: Simulated;+2 Total assistance Lower Body Dressing: Patient Percentage: 10% Where Assessed - Lower Body Dressing: Supported sit to Pharmacist, hospital: Simulated;+2 Total assistance Toilet Transfer: Patient Percentage: 50% (50% sit to stand; 70% for pivot to bed) Toilet Transfer Method: Stand pivot (to L) Acupuncturist:  (chair to bed) Toileting - Architect and Hygiene: Simulated;+2 Total assistance Toileting - Clothing Manipulation and Hygiene: Patient Percentage: 20% Where Assessed - Toileting Clothing Manipulation and Hygiene: Sit to stand from 3-in-1 or toilet Equipment Used: Gait belt;Rolling walker Transfers/Ambulation Related to ADLs: Pt takes extra time to complete any mobility due to pain.  He had towel under R thigh to help move it.  It took 4 attempts to stand.  Pt would push up a little and need to sit back down.  Pt did 50% of sit to stand ADL Comments: Did not introduce AE this visit, but introduced concept.  Pt is too painful to use on R side, but he would benefit from using on L.  Pt was very focused on pain and needed redirection back to task.      Mobility  Bed Mobility: Supine to Sit;Sitting - Scoot to [place]  of Bed;Sit to Supine Supine to Sit: 4: Min assist;With rails;HOB elevated Supine to Sit: Patient Percentage: 40% Sitting - Scoot to Edge of Bed: 5: Supervision Sit to Supine: 4: Min assist;HOB elevated;With rail Sit to Supine: Patient Percentage: 50%    Transfers  Transfers: Not assessed Sit to Stand: 1: +2 Total assist;With upper extremity assist;From chair/3-in-1 Sit to Stand: Patient Percentage: 50% Stand to Sit: 1: +2 Total assist;To bed Stand to Sit: Patient Percentage: 60% Stand Pivot Transfers: 1: +2 Total assist Stand Pivot Transfers: Patient Percentage: 20%      Ambulation / Gait / Stairs / Wheelchair Mobility  Ambulation/Gait Ambulation/Gait Assistance: Not tested (comment) Stairs: No Wheelchair Mobility Wheelchair Mobility: No    Posture / Balance      Special needs/care consideration BiPAP/CPAP - no CPM - no Continuous Drip IV -yes Dialysis - no Life Vest - no Oxygen -no Special Bed - no Trach Size  - no Wound Vac (area) - no   Skin:  R hip incision, foam dsg                               Bowel mgmt:LBM 04/17/12 Bladder mgmt:WNL Diabetic mgmt -n/a    Previous Home Environment Living Arrangements: Spouse/significant other Lives With: Spouse;Son Available Help at Discharge: Family;Available 24 hours/day Type of Home: House Home Layout: One level Home Access: Stairs to enter [employer] of Steps: 1 Bathroom Shower/Tub: Walk-in shower;Tub/shower unit Bathroom Toilet: Handicapped height Bathroom Accessibility: Yes How Accessible: Accessible via walker Home Care Services: No  Discharge Living Setting Plans for Discharge Living Setting: Patient's home;Lives with wife and son Type of Home at Discharge: House Discharge Home Layout: One level Discharge Home Access: Stairs to enter Entrance Stairs-Rails: None Entrance Stairs-Number of Steps: 1 Discharge Bathroom Shower/Tub: Walk-in shower Discharge Bathroom Toilet: Handicapped height Discharge Bathroom Accessibility: Yes How Accessible: Accessible via walker Do you have any problems obtaining your medications?: No  Social/Family/Support Systems Patient Roles: Spouse;Parent Contact Information: [phone] Anticipated Caregiver: Wife: Christie Copley & Son: [name] Anticipated Caregiver's Contact Information: [phone], Kevin:[phone] Ability/Limitations of Caregiver: None Caregiver Availability: 24/7 Discharge Plan Discussed with Primary Caregiver: Yes Is Caregiver In Agreement with Plan?: Yes Does Caregiver/Family have Issues with Lodging/Transportation while Pt  is in Rehab?: No  Goals/Additional Needs Patient/Family Goal for Rehab: Mod I - S Expected length of stay: 7-10 days Cultural Considerations: none Dietary Needs: regular Equipment Needs: TBD Additional Information: Newly diagnosed metastatic lesions/unsure how much pt has been informed of. Pt/Family Agrees to Admission and willing to participate: Yes Program Orientation Provided & Reviewed with Pt/Caregiver Including Roles  & Responsibilities: Yes  Decrease burden of Care through IP rehab admission: n/a  Possible need for SNF placement upon discharge: no  Patient Condition: This patient's condition remains as documented in the Consult dated 04/24/12, in which the Rehabilitation Physician determined and documented that the patient's condition is appropriate for intensive rehabilitative care in an inpatient rehabilitation facility.  01/10 please see updates under history of present illness above.  Admission to inpatient rehab held until 04/26/12  Preadmission Screen Completed By:  Meryl Dare, 04/24/2012 1:49 PM ______________________________________________________________________   Discussed status with Dr. Wynn Banker on 04/24/12 at 1400 and received telephone approval for admission today.  On 04/25/12 at 1358 I updated Dr. Wynn Banker of status/progress and received telephone approval for admission for 04/26/12  Admission Coordinator:  Meryl Dare, time1400/Date 04/24/12

## 2012-04-24 NOTE — Progress Notes (Signed)
Noted consult by Dr Ewing Schlein. Patient is being prepped for a colonoscopy tomorrow. Dr Truett Perna scheduled to talk with pt and family this evening. Decision was made to postpone pt's admission to CIR until after the colonoscopy. Dr Wynn Banker is in agreement as well as J. [place], [place].  Melanee Spry, RN will follow-up with pt tomorrow. Please call [phone] for questions.

## 2012-04-24 NOTE — Telephone Encounter (Signed)
LVOM for pt to return call.  °

## 2012-04-24 NOTE — Consult Note (Signed)
Reason for Consult: Metastatic cancer and abnormal CAT scan Referring Physician: Jodi Geralds  Justin Henson is an 67 y.o. male.  HPI: Patient seen at the request of his orthopedic surgeon and the case was discussed with the patient and his wife as well as the surgeon and I put a call into his oncologist but the patient has been asymptomatic from a GI standpoint but has never had a colonoscopy and his office chart was reviewed as well as was his hospital chart and CAT scan and his family history is negative. Unfortunately he is moderately lethargic from recent Xanax but he has been able to move around some from his surgery  Past Medical History  Diagnosis Date  . Cancer 04/24/2012    lesions currently under diagnosis    Past Surgical History  Procedure Date  . Femur im nail 04/22/2012    Procedure: INTRAMEDULLARY (IM) NAIL FEMORAL;  Surgeon: Harvie Junior, MD;  Location: [facility] OR;  Service: Orthopedics;  Laterality: Right;  PROPHYLACTIC  IM ROD RIGHT FEMUR     History reviewed. No pertinent family history.  Social History:  reports that he has never smoked. He has never used smokeless tobacco. He reports that he does not drink alcohol or use illicit drugs.  Allergies: No Known Allergies  Medications: I have reviewed the patient's current medications.  Results for orders placed during the hospital encounter of 04/21/12 (from the past 48 hour(s))  CBC     Status: Abnormal   Collection Time   04/23/12  6:15 AM      Component Value Range Comment   WBC 11.2 (*) 4.0 - 10.5 K/uL    RBC 4.69  4.22 - 5.81 MIL/uL    Hemoglobin 10.5 (*) 13.0 - 17.0 g/dL    HCT 40.9 (*) 81.1 - 52.0 %    MCV 70.4 (*) 78.0 - 100.0 fL    MCH 22.4 (*) 26.0 - 34.0 pg    MCHC 31.8  30.0 - 36.0 g/dL    RDW 91.4 (*) 78.2 - 15.5 %    Platelets 207  150 - 400 K/uL REPEATED TO VERIFY  BASIC METABOLIC PANEL     Status: Abnormal   Collection Time   04/23/12  6:15 AM      Component Value Range Comment   Sodium 136  135 -  145 mEq/L    Potassium 3.4 (*) 3.5 - 5.1 mEq/L    Chloride 99  96 - 112 mEq/L    CO2 26  19 - 32 mEq/L    Glucose, Bld 110 (*) 70 - 99 mg/dL    BUN 12  6 - 23 mg/dL    Creatinine, Ser 9.56  0.50 - 1.35 mg/dL    Calcium 8.7  8.4 - 21.3 mg/dL    GFR calc non Af Amer 86 (*) >90 mL/min    GFR calc Af Amer >90  >90 mL/min   CBC     Status: Abnormal   Collection Time   04/24/12  6:15 AM      Component Value Range Comment   WBC 10.5  4.0 - 10.5 K/uL    RBC 4.56  4.22 - 5.81 MIL/uL    Hemoglobin 10.2 (*) 13.0 - 17.0 g/dL    HCT 08.6 (*) 57.8 - 52.0 %    MCV 70.2 (*) 78.0 - 100.0 fL    MCH 22.4 (*) 26.0 - 34.0 pg    MCHC 31.9  30.0 - 36.0 g/dL  RDW 19.1 (*) 11.5 - 15.5 %    Platelets 179  150 - 400 K/uL   BASIC METABOLIC PANEL     Status: Abnormal   Collection Time   04/24/12  6:15 AM      Component Value Range Comment   Sodium 137  135 - 145 mEq/L    Potassium 3.4 (*) 3.5 - 5.1 mEq/L    Chloride 100  96 - 112 mEq/L    CO2 25  19 - 32 mEq/L    Glucose, Bld 110 (*) 70 - 99 mg/dL    BUN 7  6 - 23 mg/dL    Creatinine, Ser 4.09  0.50 - 1.35 mg/dL    Calcium 8.6  8.4 - 81.1 mg/dL    GFR calc non Af Amer >90  >90 mL/min    GFR calc Af Amer >90  >90 mL/min     Dg Hip Complete Right  04/24/2012  *RADIOLOGY REPORT*  Clinical Data: Right hip and femoral pain post ORIF  RIGHT HIP - COMPLETE 2+ VIEW  Comparison: 04/22/2012 Correlation:  MRI right hip 04/18/2012  Findings: Diffuse osseous demineralization. IM nail with compression screw identified at proximal right femur. A large area bone destruction is identified at the medial aspect of the proximal femur including lesser trochanteric region, corresponding to mass identified on prior MR. Fracture of the proximal right femur is identified through the destructive lesion, unchanged since intraoperative images. No dislocation identified. Visualized pelvis intact. Pelvic phleboliths noted.  IMPRESSION: Orthopedic hardware proximal right femur across a  large destructive lesion at the medial aspect of proximal right femur. Fracture identified inferior to the greater trochanter extending through the destructive lesion, unchanged since the intraoperative images of 04/22/2012.a   Original Report Authenticated By: Ulyses Southward, M.D.    Dg Femur Right  04/24/2012  *RADIOLOGY REPORT*  Clinical Data: Right hip and femoral pain post IM nailing  RIGHT FEMUR - 2 VIEW  Comparison: None  Findings: IM nail to distal locking screws identified at the mid to distal right femur. Proximal femur excluded Hip radiograph exam. Hip radiographs. No acute fracture or dislocation identified at the mid to distal femur. Lytic focus is identified at the mid right femoral diaphysis worried for a lytic metastasis. No additional areas of bone destruction identified.  IMPRESSION: Post nailing of right femur with a questionable lucent focus at the mid right femoral diaphysis, approximately 1.4 cm in size, worrisome for a lytic metastasis.   Original Report Authenticated By: Ulyses Southward, M.D.     ROS negative except for his hip pain Blood pressure 97/52, pulse 120, temperature 98.8 F (37.1 C), temperature source Oral, resp. rate 18, height 6\' 3"  (1.905 m), weight 107.502 kg (237 lb), SpO2 98.00%. Physical Exam slightly lethargic from his Xanax but no acute distress vital signs stable abdomen is soft nontender CT and labs reviewed as was pathology report and normal CEA Assessment/Plan: Metastatic cancer questionable primary but with abnormal CAT scan with questionable lesion in colon Plan: The risks benefits methods of colonoscopy was discussed with both the patient and his wife and if nondiagnostic and well-tolerated an endoscopy and his orthopedic surgeon feels he can proceed and we'll try to prep and proceed tomorrow with further workup and plans pending those findings  [name] E 04/24/2012, 4:13 PM

## 2012-04-24 NOTE — Progress Notes (Signed)
Subjective: 2 Days Post-Op Procedure(s) (LRB): INTRAMEDULLARY (IM) NAIL FEMORAL (Right) Patient reports pain as severe.    Objective: Vital signs in last 24 hours: Temp:  [97.6 F (36.4 C)-99.1 F (37.3 C)] 97.6 F (36.4 C) (01/09 0715) Pulse Rate:  [102-122] 102  (01/09 0715) Resp:  [16-18] 18  (01/09 0715) BP: (98-130)/(59-65) 130/65 mmHg (01/09 0715) SpO2:  [94 %-99 %] 99 % (01/09 0715)  Intake/Output from previous day: 01/08 0701 - 01/09 0700 In: 1388.8 [I.V.:1288.8; IV Piggyback:100] Out: 300 [Urine:300] Intake/Output this shift:     Basename 04/24/12 0615 04/23/12 0615 04/21/12 1904 04/21/12 1306  HGB 10.2* 10.5* 12.6* 13.7    Basename 04/24/12 0615 04/23/12 0615  WBC 10.5 11.2*  RBC 4.56 4.69  HCT 32.0* 33.0*  PLT 179 207    Basename 04/24/12 0615 04/23/12 0615  NA 137 136  K 3.4* 3.4*  CL 100 99  CO2 25 26  BUN 7 12  CREATININE 0.76 0.93  GLUCOSE 110* 110*  CALCIUM 8.6 8.7    Basename 04/21/12 1904 04/21/12 1306  LABPT -- --  INR 1.07 1.01    Neurologically intact ABD soft Neurovascular intact Sensation intact distally Intact pulses distally pain on rom of hip  Assessment/Plan: 2 Days Post-Op Procedure(s) (LRB): INTRAMEDULLARY (IM) NAIL FEMORAL (Right) Advance diet Up with therapy X-ray hip if ok and clears pt he may go home later today  Dreden Rivere L 04/24/2012, 8:16 AM

## 2012-04-24 NOTE — Progress Notes (Addendum)
Patient's insurance has approved for him to come to CIR today. Await notification from Dr Luiz Blare that pt is ready to d/c to CIR today.  Please call for questions: [phone]

## 2012-04-24 NOTE — Progress Notes (Signed)
CARE MANAGEMENT NOTE 04/24/2012  Patient:  CARLE, FENECH   Account Number:  [id number]  Date Initiated:  04/21/2012  Documentation initiated by:  Vance Peper  Subjective/Objective Assessment:   67 yr old male adm for eval of right hip pain. Ortho MD and Oncologist will confer to determine plan of treatment.     Action/Plan:   CM will follow.  04/24/12 Patient appropriate for CIR.   Anticipated DC Date:  04/24/2012   Anticipated DC Plan:  IP REHAB FACILITY      DC Planning Services  CM consult      Choice offered to / List presented to:             Status of service:  Completed, signed off Medicare Important Message given?   (If response is "NO", the following Medicare IM given date fields will be blank) Date Medicare IM given:   Date Additional Medicare IM given:    Discharge Disposition:  IP REHAB FACILITY  Per UR Regulation:    If discussed at Long Length of Stay Meetings, dates discussed:    Comments:  04/23/12 2:02pm Vance Peper, RN BSN Patient had IM Nail on 04/22/12. To be assessed by CIR.

## 2012-04-25 ENCOUNTER — Inpatient Hospital Stay (HOSPITAL_COMMUNITY): Payer: Medicare Other | Admitting: Physical Therapy

## 2012-04-25 ENCOUNTER — Inpatient Hospital Stay (HOSPITAL_COMMUNITY): Payer: Medicare Other | Admitting: Occupational Therapy

## 2012-04-25 ENCOUNTER — Encounter (HOSPITAL_COMMUNITY): Payer: Self-pay | Admitting: *Deleted

## 2012-04-25 ENCOUNTER — Encounter (HOSPITAL_COMMUNITY)
Admission: RE | Disposition: A | Discharge status: Rehabilitation Facility | Payer: Self-pay | Source: Ambulatory Visit | Attending: Orthopedic Surgery

## 2012-04-25 HISTORY — PX: COLONOSCOPY: SHX5424

## 2012-04-25 LAB — CBC WITH DIFFERENTIAL/PLATELET
Basophils Relative: 1 % (ref 0–1)
Eosinophils Absolute: 0.3 10*3/uL (ref 0.0–0.7)
Hemoglobin: 10.1 g/dL — ABNORMAL LOW (ref 13.0–17.0)
Lymphs Abs: 1.1 10*3/uL (ref 0.7–4.0)
MCH: 22.4 pg — ABNORMAL LOW (ref 26.0–34.0)
MCHC: 31.8 g/dL (ref 30.0–36.0)
Monocytes Relative: 14 % — ABNORMAL HIGH (ref 3–12)
Neutro Abs: 6.5 10*3/uL (ref 1.7–7.7)
Neutrophils Relative %: 70 % (ref 43–77)
Platelets: 179 10*3/uL (ref 150–400)
RBC: 4.51 MIL/uL (ref 4.22–5.81)

## 2012-04-25 LAB — COMPREHENSIVE METABOLIC PANEL
AST: 17 U/L (ref 0–37)
CO2: 27 mEq/L (ref 19–32)
Calcium: 8.6 mg/dL (ref 8.4–10.5)
Chloride: 99 mEq/L (ref 96–112)
Creatinine, Ser: 0.83 mg/dL (ref 0.50–1.35)
GFR calc Af Amer: >90 mL/min (ref >90)
GFR calc non Af Amer: 90 mL/min — ABNORMAL LOW (ref >90)
Glucose, Bld: 101 mg/dL — ABNORMAL HIGH (ref 70–99)
Total Bilirubin: 0.6 mg/dL (ref 0.3–1.2)

## 2012-04-25 SURGERY — COLONOSCOPY
Anesthesia: Moderate Sedation

## 2012-04-25 MED ORDER — FENTANYL CITRATE 0.05 MG/ML IJ SOLN
INTRAMUSCULAR | Status: DC | PRN
  Administered 2012-04-25 (×4): 25 ug via INTRAVENOUS

## 2012-04-25 MED ORDER — MIDAZOLAM HCL 5 MG/5ML IJ SOLN
INTRAMUSCULAR | Status: DC | PRN
  Administered 2012-04-25 (×5): 2 mg via INTRAVENOUS

## 2012-04-25 MED ORDER — ENOXAPARIN SODIUM 40 MG/0.4ML ~~LOC~~ SOLN
40.0000 mg | Freq: Every day | SUBCUTANEOUS | Status: DC
  Administered 2012-04-25: 40 mg via SUBCUTANEOUS
  Filled 2012-04-25 (×2): qty 0.4

## 2012-04-25 MED ORDER — FENTANYL CITRATE 0.05 MG/ML IJ SOLN
INTRAMUSCULAR | Status: AC
  Filled 2012-04-25: qty 4

## 2012-04-25 MED ORDER — MIDAZOLAM HCL 5 MG/ML IJ SOLN
INTRAMUSCULAR | Status: AC
  Filled 2012-04-25: qty 3

## 2012-04-25 NOTE — Progress Notes (Signed)
Orthopedic Tech Progress Note Patient Details:  Justin Henson 06-10-1945 [id number]  Patient ID: [name], male   DOB: [birth year], 67 y.o.   MRN: [id number] Unable to apply OHF to pt bed. Bed will not support frame.   Yedidya Duddy T 04/25/2012, 2:27 PM

## 2012-04-25 NOTE — Progress Notes (Signed)
IP PROGRESS NOTE  Subjective:   He complains of discomfort in the right groin area. He underwent a bowel prep and is scheduled for a colonoscopy this morning.  Objective: Vital signs in last 24 hours: Blood pressure 126/95, pulse 109, temperature 98.8 F (37.1 C), temperature source Oral, resp. rate 12, height 6\' 3"  (1.905 m), weight 237 lb (107.502 kg), SpO2 95.00%.  Intake/Output from previous day: 01/09 0701 - 01/10 0700 In: 960 [P.O.:360; I.V.:600] Out: -   Physical Exam:  Extremities: Ecchymosis surrounding the right thigh surgical site. Tenderness in the right groin without a palpable mass. No pain with percussion at the right anterior iliac bone.    Lab Results:  [facility] 04/25/12 0615 04/24/12 2114  WBC 9.2 10.4  HGB 10.1* 10.4*  HCT 31.8* 32.9*  PLT 179 214    BMET  Basename 04/25/12 0615 04/24/12 2114 04/24/12 0615  NA 138 -- 137  K 3.3* -- 3.4*  CL 99 -- 100  CO2 27 -- 25  GLUCOSE 101* -- 110*  BUN 10 -- 7  CREATININE 0.83 0.87 --  CALCIUM 8.6 -- 8.6    Studies/Results: Dg Hip Complete Right  04/24/2012  *RADIOLOGY REPORT*  Clinical Data: Right hip and femoral pain post ORIF  RIGHT HIP - COMPLETE 2+ VIEW  Comparison: 04/22/2012 Correlation:  MRI right hip 04/18/2012  Findings: Diffuse osseous demineralization. IM nail with compression screw identified at proximal right femur. A large area bone destruction is identified at the medial aspect of the proximal femur including lesser trochanteric region, corresponding to mass identified on prior MR. Fracture of the proximal right femur is identified through the destructive lesion, unchanged since intraoperative images. No dislocation identified. Visualized pelvis intact. Pelvic phleboliths noted.  IMPRESSION: Orthopedic hardware proximal right femur across a large destructive lesion at the medial aspect of proximal right femur. Fracture identified inferior to the greater trochanter extending through the destructive  lesion, unchanged since the intraoperative images of 04/22/2012.a   Original Report Authenticated By: Ulyses Southward, M.D.    Dg Femur Right  04/24/2012  *RADIOLOGY REPORT*  Clinical Data: Right hip and femoral pain post IM nailing  RIGHT FEMUR - 2 VIEW  Comparison: None  Findings: IM nail to distal locking screws identified at the mid to distal right femur. Proximal femur excluded Hip radiograph exam. Hip radiographs. No acute fracture or dislocation identified at the mid to distal femur. Lytic focus is identified at the mid right femoral diaphysis worried for a lytic metastasis. No additional areas of bone destruction identified.  IMPRESSION: Post nailing of right femur with a questionable lucent focus at the mid right femoral diaphysis, approximately 1.4 cm in size, worrisome for a lytic metastasis.   Original Report Authenticated By: Ulyses Southward, M.D.     Medications: I have reviewed the patient's current medications.  Assessment/Plan: 1.Destructive lesion in the proximal right femur, status post intramedullary rodding of the right femur 04/23/2011, the pathology confirmed metastatic carcinoma. The immunohistochemical profile is not consistent with colon cancer.  2. Staging CT evaluation confirming a transverse colon mass, multiple small lung nodules, a lytic lesion at the sternum, and an expansile lesion at the proximal right femur  3. Red cell microcytosis  4. Pain secondary to the right femur lesion   I discussed the pathology findings with Justin Henson and his wife. He understands the lytic metastasis at the right femur is most likely not related to colon cancer. He may have a synchronous colon cancer. He will undergo a  colonoscopy this morning to confirm the presence of a colonic mass. We will wait on the colonoscopy result and pathology prior to additional staging.  The differential diagnosis for the lytic bone lesions includes lung cancer, an upper GI primary, and metastatic carcinoma of unknown  primary. We will consider submitting tissue for a gene array panel to help establish a primary tumor site.  Recommendations: 1. Radiation oncology consult for palliative radiation to the right femur/? Pelvis-Dr. Mitzi Hansen has been consulted 2. Postoperative physical therapy/rehabilitation 3. Followup on colonoscopy result and decide on the need for additional staging evaluation such as an upper endoscopy and PET scan. 4. We will submit tissue from the femur biopsy for a gene array assay if a primary tumor site cannot otherwise been defined. 5. I will see him 04/29/2011. Please call oncology as needed over the weekend      LOS: 4 days   Justin Henson  04/25/2012, 11:16 AM

## 2012-04-25 NOTE — Consult Note (Signed)
Radiation Oncology         ([phone]) [phone] ________________________________  Name: Justin Henson MRN: [id number]  Date: 04/21/2012  DOB: [birth year]  WU:JWJXB,JYNWGN Lyn Hollingshead, MD;  Quenton Fetter, MD;     Jodi Geralds, MD  REFERRING PHYSICIAN: Quenton Fetter, MD  DIAGNOSIS: metastatic carcinoma  HISTORY OF PRESENT ILLNESS::Justin Henson is a 67 y.o. male who is seen for an initial consultation visit.   The patient is a pleasant gentleman who has had a 6 week history of worsening right leg/hip pain. This pain continues and an MRI scan of the right hip was performed which showed a large metastatic lesion within the proximal right femur. There were additional bony lesions seen within the pelvis as well. Patient did proceed with a CT scan of the abdomen and pelvis on 04/21/2012. In addition to the right femoral lesion, a transverse colon mass was seen measuring 7.4 cm. There was an additional lytic lesion within the sternum as well as some subcentimeter nonspecific pulmonary nodules. There was an additional lucent lesions seen within the T9 vertebral body.   He now has undergone femoral nail laying on 04/22/2012. Tissue results returned positive for metastatic carcinoma. The results were not indicative of a colon cancer.  Patient does note that his pain has markedly improved after the surgical procedure.  Earlier today the patient underwent a colonoscopy. A suspicious mass was seen within the transverse colon and biopsy was performed. These results are pending.  In these findings I have been asked to see the patient today for consideration of palliative radiotherapy.  PREVIOUS RADIATION THERAPY: No   PAST MEDICAL HISTORY:  has a past medical history of Cancer (04/24/2012).     PAST SURGICAL HISTORY: Past Surgical History  Procedure Date  . Femur im nail 04/22/2012    Procedure: INTRAMEDULLARY (IM) NAIL FEMORAL;  Surgeon: Harvie Junior, MD;  Location: [facility] OR;  Service: Orthopedics;   Laterality: Right;  PROPHYLACTIC  IM ROD RIGHT FEMUR      FAMILY HISTORY: family history is not on file.   SOCIAL HISTORY:  reports that he has never smoked. He has never used smokeless tobacco. He reports that he does not drink alcohol or use illicit drugs.   ALLERGIES: Review of patient's allergies indicates no known allergies.   MEDICATIONS:  Current Facility-Administered Medications  Medication Dose Route Frequency Provider Last Rate Last Dose  . acetaminophen (TYLENOL) tablet 325-650 mg  325-650 mg Oral Q4H PRN Mcarthur Rossetti Angiulli, PA      . ALPRAZolam Prudy Feeler) tablet 0.5 mg  0.5 mg Oral BID PRN Petra Kuba, MD      . alum & mag hydroxide-simeth (MAALOX/MYLANTA) 200-200-20 MG/5ML suspension 30 mL  30 mL Oral Q4H PRN Mcarthur Rossetti Angiulli, PA      . dextrose 5 %-0.45 % sodium chloride infusion   Intravenous Continuous Matthew Folks, PA 75 mL/hr at 04/24/12 1316    . docusate sodium (COLACE) capsule 100 mg  100 mg Oral BID Matthew Folks, PA   100 mg at 04/24/12 5621  . enoxaparin (LOVENOX) injection 40 mg  40 mg Subcutaneous Daily Harvie Junior, MD   40 mg at 04/25/12 1143  . HYDROcodone-acetaminophen (NORCO) 10-325 MG per tablet 1-2 tablet  1-2 tablet Oral Q4H PRN Charlton Amor, PA      . HYDROmorphone (DILAUDID) injection 1-2 mg  1-2 mg Intravenous Q4H PRN Petra Kuba, MD   1 mg at 04/24/12 2147  .  loratadine (CLARITIN) tablet 10 mg  10 mg Oral Daily Mcarthur Rossetti Angiulli, PA      . methocarbamol (ROBAXIN) tablet 500 mg  500 mg Oral Q6H PRN Matthew Folks, PA   500 mg at 04/22/12 2019   Or  . methocarbamol (ROBAXIN) 500 mg in dextrose 5 % 50 mL IVPB  500 mg Intravenous Q6H PRN Matthew Folks, PA      . ondansetron [facility]) tablet 4 mg  4 mg Oral Q6H PRN Matthew Folks, PA       Or  . ondansetron [facility]) injection 4 mg  4 mg Intravenous Q6H PRN Matthew Folks, PA      . polyethylene glycol (MIRALAX / GLYCOLAX) packet 17 g  17 g Oral Daily PRN Mcarthur Rossetti Angiulli, PA      .  pseudoephedrine (SUDAFED) 12 hr tablet 120 mg  120 mg Oral BID Mcarthur Rossetti Angiulli, PA      . senna (SENOKOT) tablet 8.6 mg  1 tablet Oral BID Mcarthur Rossetti Angiulli, PA      . sorbitol 70 % solution 30 mL  30 mL Oral Daily PRN Mcarthur Rossetti Angiulli, PA      . zolpidem (AMBIEN) tablet 5 mg  5 mg Oral QHS PRN Matthew Folks, PA         REVIEW OF SYSTEMS:  A 15 point review of systems is documented in the electronic medical record. This was obtained by the nursing staff. However, I reviewed this with the patient to discuss relevant findings and make appropriate changes.  Pertinent items are noted in HPI.    PHYSICAL EXAM:  height is 6\' 3"  (1.905 m) and weight is 237 lb (107.502 kg). His oral temperature is 99.4 F (37.4 C). His blood pressure is 107/71 and his pulse is 109. His respiration is 20 and oxygen saturation is 93%.      LABORATORY DATA:  Lab Results  Component Value Date   WBC 9.2 04/25/2012   HGB 10.1* 04/25/2012   HCT 31.8* 04/25/2012   MCV 70.5* 04/25/2012   PLT 179 04/25/2012   Lab Results  Component Value Date   NA 138 04/25/2012   K 3.3* 04/25/2012   CL 99 04/25/2012   CO2 27 04/25/2012   Lab Results  Component Value Date   ALT 8 04/25/2012   AST 17 04/25/2012   ALKPHOS 88 04/25/2012   BILITOT 0.6 04/25/2012      RADIOGRAPHY: Dg Hip Complete Right  04/24/2012  *RADIOLOGY REPORT*  Clinical Data: Right hip and femoral pain post ORIF  RIGHT HIP - COMPLETE 2+ VIEW  Comparison: 04/22/2012 Correlation:  MRI right hip 04/18/2012  Findings: Diffuse osseous demineralization. IM nail with compression screw identified at proximal right femur. A large area bone destruction is identified at the medial aspect of the proximal femur including lesser trochanteric region, corresponding to mass identified on prior MR. Fracture of the proximal right femur is identified through the destructive lesion, unchanged since intraoperative images. No dislocation identified. Visualized pelvis intact. Pelvic  phleboliths noted.  IMPRESSION: Orthopedic hardware proximal right femur across a large destructive lesion at the medial aspect of proximal right femur. Fracture identified inferior to the greater trochanter extending through the destructive lesion, unchanged since the intraoperative images of 04/22/2012.a   Original Report Authenticated By: Ulyses Southward, M.D.    Dg Femur Right  04/24/2012  *RADIOLOGY REPORT*  Clinical Data: Right hip and femoral pain post IM nailing  RIGHT FEMUR -  2 VIEW  Comparison: None  Findings: IM nail to distal locking screws identified at the mid to distal right femur. Proximal femur excluded Hip radiograph exam. Hip radiographs. No acute fracture or dislocation identified at the mid to distal femur. Lytic focus is identified at the mid right femoral diaphysis worried for a lytic metastasis. No additional areas of bone destruction identified.  IMPRESSION: Post nailing of right femur with a questionable lucent focus at the mid right femoral diaphysis, approximately 1.4 cm in size, worrisome for a lytic metastasis.   Original Report Authenticated By: Ulyses Southward, M.D.    Dg Femur Right  04/22/2012  *RADIOLOGY REPORT*  Clinical Data: NAIL PLACEMENT.  RIGHT FEMUR - 2 VIEW,DG C-ARM 1-60 MIN  Comparison: MRI 04/18/2012  Findings: Multiple intraoperative spot images demonstrate placement of intermedullary nail and dynamic hip screw within the right femur.  No hardware or bony complicating feature.  IMPRESSION: Internal fixation as above.   Original Report Authenticated By: Charlett Nose, M.D.    Ct Chest W Contrast  04/21/2012  *RADIOLOGY REPORT*  Clinical Data:  Metastatic survey  CT CHEST, ABDOMEN AND PELVIS WITH CONTRAST  Technique:  Multidetector CT imaging of the chest, abdomen and pelvis was performed following the standard protocol during bolus administration of intravenous contrast.  Contrast: OMNIPAQUE IOHEXOL 300 MG/ML  SOLN  Comparison:  04/18/2012  CT CHEST  Findings:   Lungs/pleura: Nonspecific subpleural nodule in the right upper lobe measures 5 mm, image 41. Parenchymal nodule in the right upper lobe measures 5 mm, image 33.  Left lower lobe subpleural nodule measures 5 mm, image 42.  Dependent changes are noted in both lung bases posteriorly.  Heart/Mediastinum: Normal heart size.  No pericardial effusion.  No mediastinal or hilar adenopathy.  Bones/Musculoskeletal:  Small lytic lesion is identified within the manubrium.  This measures approximately 1 cm, image 21/series 3. Lucent lesion within the T9 vertebra measures 10 mm, which is favored to represent a benign vertebral hemangioma.  IMPRESSION:  1.  Small nonspecific pulmonary nodules measure up to 5 mm. 2.  Focal lytic lesion is identified within the manubrium.  CT ABDOMEN AND PELVIS  Findings:  Mild diffuse low attenuation within the liver parenchyma identified.  No this suspicious liver abnormality noted.  1.1 cm stone noted within the gallbladder.  No biliary dilatation.  The pancreas appears normal.  Normal appearance of the spleen.  The adrenal glands are both normal.  Normal appearance of the kidneys.  The urinary bladder appears within normal limits. Prostate gland and seminal vesicles are unremarkable.  No enlarged lymph nodes within the upper abdomen.  The abdominal aorta has a normal caliber.  No pelvic or inguinal adenopathy identified.  No free fluid or fluid collections within the abdomen or pelvis. The stomach appears normal.  The small bowel loops have a normal course and caliber.  Normal appearance of the appendix.  Large mass within the proximal transverse colon is identified.  This measures 7.4 x 4.4 x 4.8 cm.  No obstruction.  Review of the visualized osseous structures again shows a expansile lytic lesion involving the proximal right femur at the level of the lesser trochanter.  This measures approximately 4.8 x 4.3 x 5.5 cm. No additional suspicious bone lesions identified.  IMPRESSION:  1.  Mass  within the proximal transverse colon is concerning for primary colonic neoplasm.  Correlation with colonoscopy is suggested. 2.  Expansile lytic lesion involving the proximal right femur is identified. This is favored to represent  osseous metastasis. Primary bone neoplasm is not excluded but is considered less favored. 3.  Hepatic steatosis. 4.  Gallstone.   Original Report Authenticated By: Signa Kell, M.D.    Ct Abdomen Pelvis W Contrast  04/21/2012  *RADIOLOGY REPORT*  Clinical Data:  Metastatic survey  CT CHEST, ABDOMEN AND PELVIS WITH CONTRAST  Technique:  Multidetector CT imaging of the chest, abdomen and pelvis was performed following the standard protocol during bolus administration of intravenous contrast.  Contrast: OMNIPAQUE IOHEXOL 300 MG/ML  SOLN  Comparison:  04/18/2012  CT CHEST  Findings:  Lungs/pleura: Nonspecific subpleural nodule in the right upper lobe measures 5 mm, image 41. Parenchymal nodule in the right upper lobe measures 5 mm, image 33.  Left lower lobe subpleural nodule measures 5 mm, image 42.  Dependent changes are noted in both lung bases posteriorly.  Heart/Mediastinum: Normal heart size.  No pericardial effusion.  No mediastinal or hilar adenopathy.  Bones/Musculoskeletal:  Small lytic lesion is identified within the manubrium.  This measures approximately 1 cm, image 21/series 3. Lucent lesion within the T9 vertebra measures 10 mm, which is favored to represent a benign vertebral hemangioma.  IMPRESSION:  1.  Small nonspecific pulmonary nodules measure up to 5 mm. 2.  Focal lytic lesion is identified within the manubrium.  CT ABDOMEN AND PELVIS  Findings:  Mild diffuse low attenuation within the liver parenchyma identified.  No this suspicious liver abnormality noted.  1.1 cm stone noted within the gallbladder.  No biliary dilatation.  The pancreas appears normal.  Normal appearance of the spleen.  The adrenal glands are both normal.  Normal appearance of the kidneys.   The urinary bladder appears within normal limits. Prostate gland and seminal vesicles are unremarkable.  No enlarged lymph nodes within the upper abdomen.  The abdominal aorta has a normal caliber.  No pelvic or inguinal adenopathy identified.  No free fluid or fluid collections within the abdomen or pelvis. The stomach appears normal.  The small bowel loops have a normal course and caliber.  Normal appearance of the appendix.  Large mass within the proximal transverse colon is identified.  This measures 7.4 x 4.4 x 4.8 cm.  No obstruction.  Review of the visualized osseous structures again shows a expansile lytic lesion involving the proximal right femur at the level of the lesser trochanter.  This measures approximately 4.8 x 4.3 x 5.5 cm. No additional suspicious bone lesions identified.  IMPRESSION:  1.  Mass within the proximal transverse colon is concerning for primary colonic neoplasm.  Correlation with colonoscopy is suggested. 2.  Expansile lytic lesion involving the proximal right femur is identified. This is favored to represent osseous metastasis. Primary bone neoplasm is not excluded but is considered less favored. 3.  Hepatic steatosis. 4.  Gallstone.   Original Report Authenticated By: Signa Kell, M.D.    Dg Chest Portable 1 View  04/21/2012  *RADIOLOGY REPORT*  Clinical Data: Preoperative respiratory evaluation.  PORTABLE CHEST - 1 VIEW  Comparison: None.  Findings: Right lung is clear.  There is some linear atelectasis or subtle scarring in the left midlung.  No edema or focal airspace consolidation. Cardiopericardial silhouette is at upper limits of normal for size. Imaged bony structures of the thorax are intact.  IMPRESSION: No acute cardiopulmonary findings.   Original Report Authenticated By: Kennith Center, M.D.    Dg C-arm 1-60 Min  04/22/2012  *RADIOLOGY REPORT*  Clinical Data: NAIL PLACEMENT.  RIGHT FEMUR - 2 VIEW,DG C-ARM 1-60  MIN  Comparison: MRI 04/18/2012  Findings: Multiple  intraoperative spot images demonstrate placement of intermedullary nail and dynamic hip screw within the right femur.  No hardware or bony complicating feature.  IMPRESSION: Internal fixation as above.   Original Report Authenticated By: Charlett Nose, M.D.        IMPRESSION: The patient has a new diagnosis of metastatic carcinoma, currently of unknown primary. He does have a suspicious mass for what appears on imaging and colonoscopy to be a malignant tumor within the transverse colon. Biopsy is pending. If malignant, and the pathologic results did not show similar features to the metastatic lesion within the femur, then a second synchronous colon primary would also be a possibility. Other workup is pending but the patient does have a clear diagnosis of metastatic carcinoma at this time.   PLAN: I believe that a palliative course of radiotherapy to the proximal femur would be appropriate given his findings. This may help further reduce his pain to some degree and importantly would also help to reduce progression of metastatic disease in this region.  I discussed therefore with the patient and his wife a potential 2 to 3 week course of treatment. Hopefully, a 2 week course of treatment would be feasible and at this time I believe that this would be the case. I would like the patient to heal additionally from his procedure. We therefore discussed having the patient proceeded with a simulation possibly on 05/05/2012 with him beginning his course of palliative radiotherapy later that week.  I believe that this treatment would be well tolerated. I did discuss the rationale of this treatment with the patient as well as the potential side effects and risks. All of their questions were answered. We will therefore have the patient placed oourscheduled to proceed as above. They were given contact information if they have any further questions.       ________________________________   Radene Gunning, MD,  PhD

## 2012-04-25 NOTE — Progress Notes (Signed)
Physical Therapy Note  Pt currently OOF and noted plan for Colonoscopy today.  Will f/u another time.    [id number] Justin Henson,  [phone]

## 2012-04-25 NOTE — Progress Notes (Signed)
Rehab admissions - Noted colonoscopy complete.  I met with patient's wife.  Wife requests that we delay inpatient rehab admission until tomorrow given that patient has had procedure today and is adjusting to the findings from tests and procedures.  Patient is asleep at this time post procedure.  I spoke with unit case manager and with the on site insurance case manager.  Will plan for inpatient rehab admission in am, tomorrow, Saturday.  Call me for questions.  #[phone]

## 2012-04-25 NOTE — Progress Notes (Signed)
Subjective: Day of Surgery Procedure(s) (LRB): COLONOSCOPY (N/A) Patient reports pain as 2 on 0-10 scale.   Pt resting after colonoscopy this am. Family reports he is "worn out".  Objective: Vital signs in last 24 hours: Temp:  [97.8 F (36.6 C)-99.4 F (37.4 C)] 99.4 F (37.4 C) (01/10 1155) Pulse Rate:  [102-115] 109  (01/10 0939) Resp:  [12-75] 20  (01/10 1155) BP: (97-132)/(54-95) 107/71 mmHg (01/10 1155) SpO2:  [91 %-99 %] 93 % (01/10 1155)  Intake/Output from previous day: 01/09 0701 - 01/10 0700 In: 960 [P.O.:360; I.V.:600] Out: -  Intake/Output this shift:     Basename 04/25/12 0615 04/24/12 2114 04/24/12 0615 04/23/12 0615  HGB 10.1* 10.4* 10.2* 10.5*    Basename 04/25/12 0615 04/24/12 2114  WBC 9.2 10.4  RBC 4.51 4.67  HCT 31.8* 32.9*  PLT 179 214    Basename 04/25/12 0615 04/24/12 2114 04/24/12 0615  NA 138 -- 137  K 3.3* -- 3.4*  CL 99 -- 100  CO2 27 -- 25  BUN 10 -- 7  CREATININE 0.83 0.87 --  GLUCOSE 101* -- 110*  CALCIUM 8.6 -- 8.6   No results found for this basename: LABPT:2,INR:2 in the last 72 hours Right hip exam: Neurovascular intact Sensation intact distally Intact pulses distally Dorsiflexion/Plantar flexion intact Incision: dressing C/D/I Compartment soft  Assessment/Plan: Day of Surgery Procedure(s) (LRB): COLONOSCOPY (N/A) S/P IM nailing of right femur for pathologic hip fx Bony mets ? Etiology    Impending workup. Plan: Up with PT TDWB on Right  To CIR Sat. Appreciate GI and Oncology help.  Charnae Lill G 04/25/2012, 5:50 PM

## 2012-04-25 NOTE — Op Note (Signed)
[facility] [address] [place], [address]   COLONOSCOPY PROCEDURE REPORT  PATIENT: Justin Henson, Justin Henson  MR#: [id number] BIRTHDATE: November 26, 1945 , 66  yrs. old GENDER: Male ENDOSCOPIST: Vida Rigger, MD REFERRED XB:JYNW Luiz Blare, M.D.  Mardelle Matte, M.D. PROCEDURE DATE:  04/25/2012 PROCEDURE:   Colonoscopy with biopsy ASA CLASS:   Class II INDICATIONS:Anemia, non-specific and an abnormal CT.  and positive metastatic cancer MEDICATIONS: Fentanyl 125 mcg IV, Versed-Detailed, and Versed 10 mg IV  DESCRIPTION OF PROCEDURE:   After the risks benefits and alternatives of the procedure were thoroughly explained, informed consent was obtained.  The [id number] ([id number])  endoscope was introduced through the anus and advanced to the ileocecal valvewe believe, limited by No adverse events experienced.2 advanced to the probable ileocecal valve was difficult to to looping and tortuosity and patient discomfort some due to his extremity. Since we thought we were at the valve but did not want to push any further and since he was not tolerating the procedure that well and we had seen the mass in the transverse on insertion we elected to withdrawThe quality of the prep was adequate. .  The instrument was then slowly withdrawn as the colon was fully examined.the only finding was a large but soft mass in the mid transverse and multiple biopsies were obtained but no other lesions were seen as we slowly withdrew back to rectum and he actually tolerated withdrawal fairly well and anal rectal pull-through in retroflexion was done which revealed minimal hemorrhoids and the scope was reinserted a short ways up the left side of the left side of the colon air was suctioned scope removedand the procedure was terminated      FINDINGS:  1. Large but soft mid transverse mass status post biopsy #2. Minimal hemorrhoids 3. Otherwise within normal limits to probably the level of the  ileocecal valve COMPLICATIONS:none  IMPRESSION:  above  RECOMMENDATIONS: Will send pathology ASAP and if different than metastatic lesion happy to proceed with endoscopy next week and please let us know if we can be of any further assistance otherwise okay to resume blood thinner and heparin or Lovenox per orthopedic and oncology team and will advance diet   _______________________________ eSigned:  Vida Rigger, MD 04/25/2012 10:58 AM   YQ:MVHQ Luiz Blare, MD Rolm Baptise, MD  PATIENT NAME:  Justin Henson, Justin Henson MR#: [id number]

## 2012-04-26 ENCOUNTER — Inpatient Hospital Stay (HOSPITAL_COMMUNITY)
Admission: RE | Admit: 2012-04-26 | Discharge: 2012-05-02 | DRG: 945 | Disposition: A | Discharge status: Home Health | Payer: 59 | Source: Intra-hospital | Attending: Physical Medicine & Rehabilitation | Admitting: Physical Medicine & Rehabilitation

## 2012-04-26 DIAGNOSIS — M84453A Pathological fracture, unspecified femur, initial encounter for fracture: Secondary | ICD-10-CM

## 2012-04-26 DIAGNOSIS — F411 Generalized anxiety disorder: Secondary | ICD-10-CM | POA: Diagnosis present

## 2012-04-26 DIAGNOSIS — M84459D Pathological fracture, hip, unspecified, subsequent encounter for fracture with routine healing: Secondary | ICD-10-CM

## 2012-04-26 DIAGNOSIS — Z5189 Encounter for other specified aftercare: Principal | ICD-10-CM

## 2012-04-26 DIAGNOSIS — D649 Anemia, unspecified: Secondary | ICD-10-CM | POA: Diagnosis present

## 2012-04-26 DIAGNOSIS — C801 Malignant (primary) neoplasm, unspecified: Secondary | ICD-10-CM | POA: Diagnosis present

## 2012-04-26 HISTORY — DX: Shortness of breath: R06.02

## 2012-04-26 HISTORY — DX: Unspecified osteoarthritis, unspecified site: M19.90

## 2012-04-26 LAB — BASIC METABOLIC PANEL
Calcium: 8.8 mg/dL (ref 8.4–10.5)
Chloride: 102 mEq/L (ref 96–112)
Creatinine, Ser: 0.73 mg/dL (ref 0.50–1.35)
GFR calc Af Amer: >90 mL/min (ref >90)
Sodium: 140 mEq/L (ref 135–145)

## 2012-04-26 MED ORDER — MAGNESIUM CITRATE PO SOLN
1.0000 | Freq: Once | ORAL | Status: AC | PRN
  Filled 2012-04-26: qty 296

## 2012-04-26 MED ORDER — PSEUDOEPHEDRINE HCL ER 120 MG PO TB12
120.0000 mg | ORAL_TABLET | Freq: Every day | ORAL | Status: DC
  Administered 2012-04-28: 120 mg via ORAL
  Filled 2012-04-26 (×7): qty 1

## 2012-04-26 MED ORDER — ALPRAZOLAM 0.5 MG PO TABS
0.5000 mg | ORAL_TABLET | Freq: Two times a day (BID) | ORAL | Status: DC | PRN
  Administered 2012-04-26: 0.5 mg via ORAL
  Filled 2012-04-26: qty 1

## 2012-04-26 MED ORDER — LORATADINE 10 MG PO TABS
10.0000 mg | ORAL_TABLET | Freq: Every day | ORAL | Status: DC
  Administered 2012-04-26: 10 mg via ORAL
  Filled 2012-04-26: qty 1

## 2012-04-26 MED ORDER — ALPRAZOLAM 0.5 MG PO TABS
0.5000 mg | ORAL_TABLET | Freq: Three times a day (TID) | ORAL | Status: DC | PRN
  Administered 2012-04-27: 0.5 mg via ORAL
  Filled 2012-04-26: qty 1

## 2012-04-26 MED ORDER — ENOXAPARIN SODIUM 40 MG/0.4ML ~~LOC~~ SOLN
40.0000 mg | SUBCUTANEOUS | Status: DC
Start: 2012-04-26 — End: 2012-04-26

## 2012-04-26 MED ORDER — ONDANSETRON HCL 4 MG PO TABS: 4.0000 mg | ORAL_TABLET | Freq: Four times a day (QID) | ORAL | Status: DC | PRN

## 2012-04-26 MED ORDER — ONDANSETRON HCL 4 MG/2ML IJ SOLN
4.0000 mg | Freq: Four times a day (QID) | INTRAMUSCULAR | Status: DC | PRN
  Filled 2012-04-26: qty 2

## 2012-04-26 MED ORDER — TRAZODONE HCL 50 MG PO TABS: 25.0000 mg | ORAL_TABLET | Freq: Every evening | ORAL | Status: DC | PRN

## 2012-04-26 MED ORDER — ONDANSETRON HCL 4 MG/2ML IJ SOLN: 4.0000 mg | Freq: Four times a day (QID) | INTRAMUSCULAR | Status: DC | PRN

## 2012-04-26 MED ORDER — OXYCODONE HCL 5 MG PO TABS
5.0000 mg | ORAL_TABLET | ORAL | Status: DC | PRN
  Administered 2012-04-27 – 2012-05-02 (×9): 10 mg via ORAL
  Filled 2012-04-26 (×10): qty 2

## 2012-04-26 MED ORDER — SENNA 8.6 MG PO TABS
1.0000 | ORAL_TABLET | Freq: Two times a day (BID) | ORAL | Status: DC
  Administered 2012-04-26 – 2012-04-30 (×9): 8.6 mg via ORAL
  Filled 2012-04-26 (×17): qty 1

## 2012-04-26 MED ORDER — ENOXAPARIN SODIUM 40 MG/0.4ML ~~LOC~~ SOLN
40.0000 mg | Freq: Every day | SUBCUTANEOUS | Status: DC
  Administered 2012-04-26: 40 mg via SUBCUTANEOUS
  Filled 2012-04-26: qty 0.4

## 2012-04-26 MED ORDER — LORATADINE 10 MG PO TABS
10.0000 mg | ORAL_TABLET | Freq: Every day | ORAL | Status: DC
  Filled 2012-04-26 (×8): qty 1

## 2012-04-26 MED ORDER — ALUM & MAG HYDROXIDE-SIMETH 200-200-20 MG/5ML PO SUSP: 30.0000 mL | ORAL | Status: DC | PRN

## 2012-04-26 MED ORDER — ENOXAPARIN SODIUM 40 MG/0.4ML ~~LOC~~ SOLN
40.0000 mg | SUBCUTANEOUS | Status: DC
  Administered 2012-04-27 – 2012-05-01 (×5): 40 mg via SUBCUTANEOUS
  Filled 2012-04-26 (×8): qty 0.4

## 2012-04-26 MED ORDER — BISACODYL 10 MG RE SUPP: 10.0000 mg | Freq: Every day | RECTAL | Status: DC | PRN

## 2012-04-26 MED ORDER — ACETAMINOPHEN 325 MG PO TABS: 325.0000 mg | ORAL_TABLET | ORAL | Status: DC | PRN

## 2012-04-26 MED ORDER — METHOCARBAMOL 500 MG PO TABS
500.0000 mg | ORAL_TABLET | Freq: Four times a day (QID) | ORAL | Status: DC | PRN
  Administered 2012-04-27 – 2012-05-02 (×7): 500 mg via ORAL
  Filled 2012-04-26 (×7): qty 1

## 2012-04-26 MED ORDER — POLYETHYLENE GLYCOL 3350 17 G PO PACK: 17.0000 g | PACK | Freq: Every day | ORAL | Status: DC | PRN

## 2012-04-26 MED ORDER — METHOCARBAMOL 500 MG PO TABS: 500.0000 mg | ORAL_TABLET | Freq: Four times a day (QID) | ORAL | Status: DC | PRN

## 2012-04-26 MED ORDER — MORPHINE SULFATE ER 15 MG PO TBCR
15.0000 mg | EXTENDED_RELEASE_TABLET | Freq: Two times a day (BID) | ORAL | Status: DC
  Administered 2012-04-26 – 2012-04-28 (×5): 15 mg via ORAL
  Filled 2012-04-26 (×5): qty 1

## 2012-04-26 MED ORDER — HYDROMORPHONE HCL PF 1 MG/ML IJ SOLN: 1.0000 mg | INTRAMUSCULAR | Status: DC | PRN

## 2012-04-26 MED ORDER — ONDANSETRON HCL 4 MG PO TABS
4.0000 mg | ORAL_TABLET | Freq: Four times a day (QID) | ORAL | Status: DC | PRN
  Administered 2012-05-01: 4 mg via ORAL
  Filled 2012-04-26: qty 1

## 2012-04-26 MED ORDER — ACETAMINOPHEN 325 MG PO TABS
325.0000 mg | ORAL_TABLET | ORAL | Status: DC | PRN
  Administered 2012-04-27: 650 mg via ORAL

## 2012-04-26 MED ORDER — SORBITOL 70 % SOLN: 30.0000 mL | Freq: Every day | Status: DC | PRN

## 2012-04-26 MED ORDER — HYDROCODONE-ACETAMINOPHEN 10-325 MG PO TABS
1.0000 | ORAL_TABLET | ORAL | Status: DC | PRN
  Administered 2012-04-26 (×2): 1 via ORAL
  Filled 2012-04-26 (×2): qty 1

## 2012-04-26 NOTE — Progress Notes (Addendum)
Subjective: Patient reports pain as mild, but still worn out from colonscopy and prep Reports to me no additional info as to etiology of lesion  Objective: Vital signs in last 24 hours: Temp:  [97.8 F (36.6 C)-99.4 F (37.4 C)] 97.8 F (36.6 C) (01/11 0531) Pulse Rate:  [91] 91  (01/11 0531) Resp:  [12-75] 18  (01/11 0531) BP: (97-126)/(63-95) 126/72 mmHg (01/11 0531) SpO2:  [93 %-98 %] 98 % (01/11 0531)  Intake/Output from previous day: 01/10 0701 - 01/11 0700 In: 720 [P.O.:480; I.V.:240] Out: 850 [Urine:850] Intake/Output this shift: Total I/O In: 240 [P.O.:240] Out: -    Basename 04/25/12 0615 04/24/12 2114 04/24/12 0615  HGB 10.1* 10.4* 10.2*    Basename 04/25/12 0615 04/24/12 2114  WBC 9.2 10.4  RBC 4.51 4.67  HCT 31.8* 32.9*  PLT 179 214    Basename 04/26/12 0640 04/25/12 0615  NA 140 138  K 3.4* 3.3*  CL 102 99  CO2 26 27  BUN 9 10  CREATININE 0.73 0.83  GLUCOSE 91 101*  CALCIUM 8.8 8.6   No results found for this basename: LABPT:2,INR:2 in the last 72 hours Right hip exam: Superior incision with some scant wet bloody drainage, dressing changed Other 2 incisions CDI, left open to air NVI  Assessment/Plan: POD 4 IM nailing of right femur for pathologic hip fx To CIR today.  Sharnell Knight A. 04/26/2012, 10:10 AM

## 2012-04-26 NOTE — Progress Notes (Signed)
1600 Pt. Admitted to rehab from 5N.  Wife at patient's bedside.  AOX3; Vital Signs Stable.  Oriented to rehab; call bell in reach.

## 2012-04-26 NOTE — Progress Notes (Signed)
Justin Henson 10:25 AM  Subjective: Patient without any new complaint and no obvious post colonoscopy problem  Objective: Vital signs stable afebrile no acute distress abdomen is soft nontender  Assessment: Metastatic cancer with colon lesion  Plan: Await biopsy although with this lesion being soft the biopsies may not be deep enough to say for sure that this is not responsible and we are happy to proceed with endoscopy if needed by oncology team and I have rediscussed the case with both the patient and his wife  [facility] E

## 2012-04-26 NOTE — H&P (Signed)
CC: R hip pain HPI:  Justin Henson is a 67 y.o. right-handed male with unremarkable past medical history. Patient was independent prior to admission. Admitted 04/21/2012 with right hip pain x6 weeks. He was evaluated initially in the orthopedic office and x-ray showed an avulsion of the lesser trochanter and there was concern that this was causing his pain. 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 There was also significant other lesions in the pelvis per CT of abdomen and pelvis showed a large mass within the proximal transverse colon measuring 7.4 x 4.4 x 4.8 cm. In addition a lucent lesion within the T9 vertebral body measured 10 mm was noted. Patient was admitted on 04/21/2012 and underwent intramedullary nail 04/22/2012 per Dr. Luiz Blare. Advise 50 pound partial weightbearing to right lower extremity. Maintained on subcutaneous Lovenox for DVT prophylaxis. Followup oncology services Dr. Myrle Sheng and await the formal pathology report and make additional staging treatment recommendations and followup outpatient for most likely diagnosis of metastatic colon cancer. Radiation oncology was to be scheduled as an outpatient with Dr. Mitzi Hansen. Underwent colonoscopy with biopsy 04/25/2012 per Dr. Ewing Schlein for workup of colon mass with biopsy pending. Postoperative anemia 10.2 and monitored. Physical and occupational therapy evaluations completed and ongoing with recommendations of physical medicine rehabilitation consult to consider inpatient rehabilitation services. Patient was felt to be a good candidate for inpatient rehabilitation services and was admitted for a comprehensive rehabilitation program  Underwent colonoscopy 04/25/2012 by Dr Ewing Schlein soft mass transverse colon biopsy results pending Rad Onc Consult Dr Mitzi Hansen plan marking for palliative rad tx on 1/20 Onc Dr Truett Perna  Pt denies hip pain at rest  Review  of Systems  Constitutional: Negative for fever and chills.  HENT: Negative for hearing loss.  Cardiovascular: Negative for chest pain and palpitations.  Genitourinary: Negative for dysuria and urgency.  Musculoskeletal: Positive for myalgias and joint pain.  Neurological: Negative for headaches.  Endo/Heme/Allergies: Does not bruise/bleed easily.  Psychiatric/Behavioral:  Panic attacks  All other systems reviewed and are negative  Past Medical History   Diagnosis  Date   .  Cancer  04/24/2012     lesions currently under diagnosis    Past Surgical History   Procedure  Date   .  Femur im nail  04/22/2012     Procedure: INTRAMEDULLARY (IM) NAIL FEMORAL; Surgeon: Harvie Junior, MD; Location: [facility] OR; Service: Orthopedics; Laterality: Right; PROPHYLACTIC IM ROD RIGHT FEMUR    History reviewed. No pertinent family history.  Social History: reports that he has never smoked. He has never used smokeless tobacco. He reports that he does not drink alcohol or use illicit drugs.  Allergies: No Known Allergies  Medications Prior to Admission   Medication  Sig  Dispense  Refill   .  loratadine-pseudoephedrine (CLARITIN-D 24-HOUR) 10-240 MG per 24 hr tablet  Take 1 tablet by mouth daily.     Marland Kitchen  oxyCODONE-acetaminophen (PERCOCET/ROXICET) 5-325 MG per tablet  Take 1 tablet by mouth every 6 (six) hours as needed. For pain      Home:  Home Living  Lives With: Spouse;Son  Available Help at Discharge: Family;Available 24 hours/day  Type of Home: House  Home Access: Stairs to enter  [employer] of Steps: 1  Home Layout: One level  Bathroom Shower/Tub: Walk-in shower;Tub/shower unit  Bathroom Toilet: Handicapped height  Bathroom Accessibility: Yes  How Accessible: Accessible via walker  Home Adaptive  Equipment: Walker - rolling;Crutches;Wheelchair - manual  Functional History:  Prior Function  Able to Take Stairs?: Yes  Driving: Yes  Functional Status:  Mobility:  Bed Mobility  Bed  Mobility: Supine to Sit;Sitting - Scoot to Edge of Bed  Supine to Sit: 1: +2 Total assist  Supine to Sit: Patient Percentage: 40%  Sitting - Scoot to Edge of Bed: 3: Mod assist  Sit to Supine: 1: +2 Total assist  Sit to Supine: Patient Percentage: 50%  Transfers  Transfers: Sit to Stand;Stand to Sit;Stand Pivot Transfers  Sit to Stand: 1: +2 Total assist;With upper extremity assist;From chair/3-in-1  Sit to Stand: Patient Percentage: 50%  Stand to Sit: 1: +2 Total assist;To bed  Stand to Sit: Patient Percentage: 60%  Stand Pivot Transfers: 1: +2 Total assist  Stand Pivot Transfers: Patient Percentage: 20%  Ambulation/Gait  Ambulation/Gait Assistance: Not tested (comment)  Stairs: No  Wheelchair Mobility  Wheelchair Mobility: No  ADL:  ADL  Grooming: Simulated;Supervision/safety  Where Assessed - Grooming: Supported sitting  Upper Body Bathing: Simulated;Supervision/safety  Where Assessed - Upper Body Bathing: Supported sitting  Lower Body Bathing: Simulated;+2 Total assistance  Where Assessed - Lower Body Bathing: Supported sit to stand  Upper Body Dressing: Simulated;Minimal assistance (iv)  Where Assessed - Upper Body Dressing: Supported sitting  Lower Body Dressing: Simulated;+2 Total assistance  Where Assessed - Lower Body Dressing: Supported sit to Scientist, research (life sciences): Simulated;+2 Total assistance  Toilet Transfer Method: Stand pivot (to L)  Acupuncturist: (chair to bed)  Equipment Used: Gait belt;Rolling walker  Transfers/Ambulation Related to ADLs: Pt takes extra time to complete any mobility due to pain. He had towel under R thigh to help move it. It took 4 attempts to stand. Pt would push up a little and need to sit back down. Pt did 50% of sit to stand  ADL Comments: Did not introduce AE this visit, but introduced concept. Pt is too painful to use on R side, but he would benefit from using on L. Pt was very focused on pain and needed redirection back to  task.  Cognition:  Cognition  Arousal/Alertness: Awake/alert  Orientation Level: Oriented X4  Cognition  Overall Cognitive Status: Impaired  Area of Impairment: Attention (focused on pain/procedure; cues to stay on task)  Difficult to assess due to: (High Anxiety )  Arousal/Alertness: Awake/alert  Orientation Level: Appears intact for tasks assessed  Behavior During Session: Anxious  Cognition - Other Comments: pt extremely anxious and seems to have a panic attack while transfering to chair.  Blood pressure 130/65, pulse 102, temperature 97.6 F (36.4 C), temperature source Oral, resp. rate 18, height 6\' 3"  (1.905 m), weight 107.502 kg (237 lb), SpO2 99.00%.   Physical Exam  Vitals reviewed.  Constitutional: He is oriented to person, place, and time. He appears well-developed.  HENT:  Head: Normocephalic.  Eyes:  Pupils round and reactive to light  Neck: Neck supple. No thyromegaly present.  Cardiovascular: Normal rate and regular rhythm.  Pulmonary/Chest: Effort normal and breath sounds normal. He has no wheezes.  Abdominal: Bowel sounds are normal. He exhibits no distension. There is no tenderness. There is no rebound.  Musculoskeletal: He exhibits no edema.  Pain along right inguinal area, likely along RF or iliopsoas. Worse with HF. Right hip wound dressed  Neurological: He is alert and oriented to person, place, and time. He has normal reflexes. He displays normal reflexes. No cranial nerve deficit. He exhibits normal muscle tone. Coordination normal.  Follows full commands. A bit tangential but appropriate. Other than pain inhibition in right leg (which is major) motor and sensory exam intact  Skin:  Surgical site clean and dry  Psychiatric: He has a labile mood and affect, crying with wife as I entered room  Results for orders placed during the hospital encounter of 04/21/12 (from the past 48 hour(s))   CBC Status: Abnormal    Collection Time    04/23/12 6:15 AM     Component  Value  Range  Comment    WBC  11.2 (*)  4.0 - 10.5 K/uL     RBC  4.69  4.22 - 5.81 MIL/uL     Hemoglobin  10.5 (*)  13.0 - 17.0 g/dL     HCT  16.1 (*)  09.6 - 52.0 %     MCV  70.4 (*)  78.0 - 100.0 fL     MCH  22.4 (*)  26.0 - 34.0 pg     MCHC  31.8  30.0 - 36.0 g/dL     RDW  04.5 (*)  40.9 - 15.5 %     Platelets  207  150 - 400 K/uL  REPEATED TO VERIFY   BASIC METABOLIC PANEL Status: Abnormal    Collection Time    04/23/12 6:15 AM   Component  Value  Range  Comment    Sodium  136  135 - 145 mEq/L     Potassium  3.4 (*)  3.5 - 5.1 mEq/L     Chloride  99  96 - 112 mEq/L     CO2  26  19 - 32 mEq/L     Glucose, Bld  110 (*)  70 - 99 mg/dL     BUN  12  6 - 23 mg/dL     Creatinine, Ser  8.11  0.50 - 1.35 mg/dL     Calcium  8.7  8.4 - 10.5 mg/dL     GFR calc non Af Amer  86 (*)  >90 mL/min     GFR calc Af Amer  >90  >90 mL/min    CBC Status: Abnormal    Collection Time    04/24/12 6:15 AM   Component  Value  Range  Comment    WBC  10.5  4.0 - 10.5 K/uL     RBC  4.56  4.22 - 5.81 MIL/uL     Hemoglobin  10.2 (*)  13.0 - 17.0 g/dL     HCT  91.4 (*)  78.2 - 52.0 %     MCV  70.2 (*)  78.0 - 100.0 fL     MCH  22.4 (*)  26.0 - 34.0 pg     MCHC  31.9  30.0 - 36.0 g/dL     RDW  95.6 (*)  21.3 - 15.5 %     Platelets  179  150 - 400 K/uL    BASIC METABOLIC PANEL Status: Abnormal    Collection Time    04/24/12 6:15 AM   Component  Value  Range  Comment    Sodium  137  135 - 145 mEq/L     Potassium  3.4 (*)  3.5 - 5.1 mEq/L     Chloride  100  96 - 112 mEq/L     CO2  25  19 - 32 mEq/L     Glucose, Bld  110 (*)  70 - 99 mg/dL     BUN  7  6 - 23  mg/dL     Creatinine, Ser  6.21  0.50 - 1.35 mg/dL     Calcium  8.6  8.4 - 10.5 mg/dL     GFR calc non Af Amer  >90  >90 mL/min     GFR calc Af Amer  >90  >90 mL/min     Dg Hip Complete Right  04/24/2012 *RADIOLOGY REPORT* Clinical Data: Right hip and femoral pain post ORIF RIGHT HIP - COMPLETE 2+ VIEW Comparison: 04/22/2012 Correlation:  MRI right hip 04/18/2012 Findings: Diffuse osseous demineralization. IM nail with compression screw identified at proximal right femur. A large area bone destruction is identified at the medial aspect of the proximal femur including lesser trochanteric region, corresponding to mass identified on prior MR. Fracture of the proximal right femur is identified through the destructive lesion, unchanged since intraoperative images. No dislocation identified. Visualized pelvis intact. Pelvic phleboliths noted. IMPRESSION: Orthopedic hardware proximal right femur across a large destructive lesion at the medial aspect of proximal right femur. Fracture identified inferior to the greater trochanter extending through the destructive lesion, unchanged since the intraoperative images of 04/22/2012.a Original Report Authenticated By: Ulyses Southward, M.D.  Dg Femur Right  04/24/2012 *RADIOLOGY REPORT* Clinical Data: Right hip and femoral pain post IM nailing RIGHT FEMUR - 2 VIEW Comparison: None Findings: IM nail to distal locking screws identified at the mid to distal right femur. Proximal femur excluded Hip radiograph exam. Hip radiographs. No acute fracture or dislocation identified at the mid to distal femur. Lytic focus is identified at the mid right femoral diaphysis worried for a lytic metastasis. No additional areas of bone destruction identified. IMPRESSION: Post nailing of right femur with a questionable lucent focus at the mid right femoral diaphysis, approximately 1.4 cm in size, worrisome for a lytic metastasis. Original Report Authenticated By: Ulyses Southward, M.D.   Post Admission Physician Evaluation:  1. 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 2. Patient is admitted to receive collaborative, interdisciplinary care between the physiatrist, rehab nursing staff, and therapy team. 3. Patient's level of medical complexity and substantial therapy needs in  context of that medical necessity cannot be provided at a lesser intensity of care such as a SNF. 4. Patient has experienced substantial functional loss from his/her baseline which was documented above under the "Functional History" and "Functional Status" headings. Judging by the patient's diagnosis, physical exam, and functional history, the patient has potential for functional progress which will result in measurable gains while on inpatient rehab. These gains will be of substantial and practical use upon discharge in facilitating mobility and self-care at the household level. 5. Physiatrist will provide 24 hour management of medical needs as well as oversight of the therapy plan/treatment and provide guidance as appropriate regarding the interaction of the two. 6. 24 hour rehab nursing will assist with bowel management, safety, skin/wound care, disease management, medication administration, pain management and patient education and help integrate therapy concepts, techniques,education, etc. 7. PT will assess and treat for: pre gait, gait, safety, endurance, balance, equipment. Goals are: sup mobility. 8. OT will assess and treat for: ADL, Cog/percept, safety , endurance balance, equipment. Goals are: Sup LE ADL, Mod I UE ADL. 9. SLP will assess and treat for: NA. Goals are: NA. 10. Case Management and Social Worker will assess and treat for psychological issues and discharge planning. 11. Team conference will be held weekly to assess progress toward goals and to determine barriers to discharge. 12. Patient  will receive at least 3 hours of therapy per day at least 5 days per week. 13. ELOS: 10-14 days Prognosis: good Medical Problem List and Plan:  1. Pathologic right femur fracture status post IM nailing/ right hip flexor strain  2. DVT Prophylaxis/Anticoagulation: Subcutaneous Lovenox. Monitor platelet counts any signs of bleeding  3. Pain Management: Hydrocodone, Robaxin as needed monitor with  increased mobility. May need to consider scheduled narcotics  4. Neuropsych: This patient is capable of making decisions on his/her own behalf.  5. Postoperative anemia. Latest hemoglobin 10.2. Followup CBC. Continue iron supplement.  6. Oncology. Await formal pathology report and staging as directed per Dr. Myrle Sheng with followup plan as an outpatient

## 2012-04-27 ENCOUNTER — Inpatient Hospital Stay (HOSPITAL_COMMUNITY): Payer: 59 | Admitting: *Deleted

## 2012-04-27 ENCOUNTER — Inpatient Hospital Stay (HOSPITAL_COMMUNITY): Payer: 59

## 2012-04-27 DIAGNOSIS — M84459D Pathological fracture, hip, unspecified, subsequent encounter for fracture with routine healing: Secondary | ICD-10-CM

## 2012-04-27 DIAGNOSIS — M84453A Pathological fracture, unspecified femur, initial encounter for fracture: Secondary | ICD-10-CM

## 2012-04-27 LAB — CBC WITH DIFFERENTIAL/PLATELET
Basophils Absolute: 0.1 10*3/uL (ref 0.0–0.1)
Eosinophils Absolute: 0.4 10*3/uL (ref 0.0–0.7)
Lymphocytes Relative: 21 % (ref 12–46)
Lymphs Abs: 1.4 10*3/uL (ref 0.7–4.0)
MCH: 22.6 pg — ABNORMAL LOW (ref 26.0–34.0)
Neutrophils Relative %: 63 % (ref 43–77)
Platelets: 210 10*3/uL (ref 150–400)
RBC: 4.07 MIL/uL — ABNORMAL LOW (ref 4.22–5.81)

## 2012-04-27 LAB — COMPREHENSIVE METABOLIC PANEL
ALT: 9 U/L (ref 0–53)
AST: 18 U/L (ref 0–37)
Alkaline Phosphatase: 85 U/L (ref 39–117)
GFR calc Af Amer: >90 mL/min (ref >90)
Glucose, Bld: 102 mg/dL — ABNORMAL HIGH (ref 70–99)
Potassium: 3.3 mEq/L — ABNORMAL LOW (ref 3.5–5.1)
Sodium: 141 mEq/L (ref 135–145)
Total Protein: 5.5 g/dL — ABNORMAL LOW (ref 6.0–8.3)

## 2012-04-27 MED ORDER — ZOLPIDEM TARTRATE 5 MG PO TABS
5.0000 mg | ORAL_TABLET | Freq: Every evening | ORAL | Status: DC | PRN
  Administered 2012-04-27: 5 mg via ORAL
  Filled 2012-04-27: qty 1

## 2012-04-27 NOTE — Evaluation (Signed)
Occupational Therapy Assessment and Plan  Patient Details  Name: Justin Henson MRN: [id number] Date of Birth: [birth year]  OT Diagnosis: acute pain and muscle weakness (generalized) Rehab Potential: Rehab Potential: Good ELOS: 2 weeks   Today's Date: 04/27/2012 Time: 1000-1059 Time Calculation (min): 59 min  Skilled Therapeutic Interventions: 1:1 OT initial evaluation completed with treatment provided to emphasis role of OT and PMR-related services, patient safety, pain management, family ed, and introduction to use of AE (leg lifter).  Problem List: There is no problem list on file for this patient.   Past Medical History:  Past Medical History  Diagnosis Date  . Cancer 04/24/2012    lesions currently under diagnosis   Past Surgical History:  Past Surgical History  Procedure Date  . Femur im nail 04/22/2012    Procedure: INTRAMEDULLARY (IM) NAIL FEMORAL;  Surgeon: Harvie Junior, MD;  Location: [facility] OR;  Service: Orthopedics;  Laterality: Right;  PROPHYLACTIC  IM ROD RIGHT FEMUR     Assessment & Plan Clinical Impression: Patient is a 67 y.o. year old male admitted 04/21/2012 with right hip pain x6 weeks. He was evaluated initially in the orthopedic office and x-ray showed an avulsion of the lesser trochanter and there was concern that this was causing his pain. 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 There was also significant other lesions in the pelvis per CT of abdomen and pelvis showed a large mass within the proximal transverse colon measuring 7.4 x 4.4 x 4.8 cm. In addition a lucent lesion within the T9 vertebral body measured 10 mm was noted. Patient was admitted on 04/21/2012 and underwent intramedullary nail 04/22/2012 per Dr. Luiz Blare. Advise 50 pound partial weightbearing to right lower extremity.  Patient transferred to CIR on 04/26/2012 .    Patient currently requires max  with basic self-care skills secondary to muscle weakness.  Prior to hospitalization, patient could complete BADL/IALD with independent .  Patient will benefit from skilled intervention to increase independence with basic self-care skills prior to discharge home with care partner.  Anticipate patient will require minimal physical assistance and follow up home health.  OT - End of Session Activity Tolerance: Tolerates 10 - 20 min activity with multiple rests Endurance Deficit: Yes Endurance Deficit Description: Patient requieres increased rest time beetween tasks. OT Assessment Rehab Potential: Good Barriers to Discharge: None OT Plan OT Intensity: Minimum of 1-2 x/day, 45 to 90 minutes OT Frequency: 5 out of 7 days OT Duration/Estimated Length of Stay: 2 weeks OT Treatment/Interventions: Discharge planning;DME/adaptive equipment instruction;Functional mobility training;Pain management;Patient/family education;Psychosocial support;Self Care/advanced ADL retraining;Therapeutic Activities;Therapeutic Exercise OT Recommendation Recommendations for Other Services: Neuropsych consult Patient destination: Home Follow Up Recommendations: None Equipment Recommended: Tub/shower seat;3 in 1 bedside comode;Wheelchair (measurements)  OT Evaluation Precautions/Restrictions  Precautions Precautions: Fall Restrictions Weight Bearing Restrictions: Yes RLE Weight Bearing: Weight bearing as tolerated RLE Partial Weight Bearing Percentage or Pounds: 25-50%  General Chart Reviewed: Yes Family/Caregiver Present: Yes (comment)  Pain Pain Assessment Pain Assessment: 0-10 Pain Score:   2 Faces Pain Scale: Hurts a little bit Pain Type: Acute pain Pain Location: Leg Pain Orientation: Right Pain Descriptors: Penetrating Pain Onset: With Activity Patients Stated Pain Goal: 0 Pain Intervention(s): Medication (See eMAR);Relaxation;Distraction Multiple Pain Sites: No  Home Living/Prior  Functioning Home Living Lives With: Spouse Available Help at Discharge: Family;Friend(s) Type of Home: House Home Access: Stairs to enter [employer] of Steps: 1  Entrance Stairs-Rails: None Home Layout: One level Bathroom Shower/Tub: Health visitor: Standard Bathroom Accessibility: Yes How Accessible: Accessible via wheelchair Home Adaptive Equipment: Walker - rolling;Crutches;Wheelchair - manual IADL History Homemaking Responsibilities: No Current License: Yes Mode of TransportationGames developer Education: AA Forensic psychologist, Cytogeneticist Occupation: Full time employment;Retired (resigned recently) 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[employer], Scientist, 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 Leisure and Hobbies: carved wood and gords Prior Function Level of Independence: Independent with basic ADLs Able to Take Stairs?: Yes Driving: Yes Vocation: Full time employment  Vision/Perception  Vision - History Baseline Vision: Wears glasses all the time Patient Visual Report: No change from baseline Vision - Assessment Eye Alignment: Within Functional Limits Perception Perception: Within Functional Limits Praxis Praxis: Intact   Cognition Overall Cognitive Status: Appears within functional limits for tasks assessed Arousal/Alertness: Awake/alert Orientation Level: Oriented X4 Attention: Sustained Sustained Attention: Appears intact Memory: Appears intact Awareness: Appears intact Problem Solving: Impaired Problem Solving Impairment: Functional complex Executive Function: Reasoning Reasoning: Impaired Reasoning Impairment: Functional complex Behaviors: Poor frustration tolerance Safety/Judgment: Appears intact  Sensation Sensation Light Touch: Appears Intact Stereognosis: Appears Intact Hot/Cold: Appears Intact Proprioception: Appears Intact Coordination Gross Motor Movements are Fluid and Coordinated: Yes Fine Motor Movements are Fluid and Coordinated: Yes  Motor   Motor Motor: Within Functional Limits  Mobility  Bed Mobility Rolling Right: 4: Min guard Rolling Left: 4: Min guard Left Sidelying to Sit: 4: Min guard Supine to Sit: 4: Min assist Supine to Sit Details: Verbal cues for precautions/safety;Verbal cues for technique;Verbal cues for sequencing Sitting - Scoot to Edge of Bed: 4: Min guard Sitting - Scoot to [employer] of Bed Details: Verbal cues for precautions/safety;Verbal cues for technique Sit to Supine: 4: Min guard Transfers Sit to Stand: 4: Min guard Stand to Sit: 4: Min guard   Trunk/Postural Assessment  Postural Control Postural Control: Within Functional Limits    Extremity/Trunk Assessment RUE Assessment RUE Assessment: Within Functional Limits LUE Assessment LUE Assessment: Within Functional Limits  See FIM for current functional status Refer to Care Plan for Long Term Goals  Recommendations for other services: Neuropsych  Discharge Criteria: Patient will be discharged from OT if patient refuses treatment 3 consecutive times without medical reason, if treatment goals not met, if there is a change in medical status, if patient makes no progress towards goals or if patient is discharged from hospital.  The above assessment, treatment plan, treatment alternatives and goals were discussed and mutually agreed upon: by patient and by family   See FIM for current functional status  Therapy/Group: Individual Therapy   Georgeanne Nim 04/27/2012, 4:19 PM

## 2012-04-27 NOTE — Plan of Care (Signed)
Overall Plan of Care [facility]) Patient Details Name: Justin Henson MRN: [id number] DOB: Oct 11, 1945  Diagnosis:  Pathological right femur fx's, unkown cancer origin at thistime.  Primary Diagnosis:    <principal problem not specified> Co-morbidities: colonic mass, pain, postop anemia.  Functional Problem List  Patient demonstrates impairments in the following areas: Bowel, Edema, Endurance, Medication Management, Nutrition, Pain and Skin Integrity  Basic ADL's: grooming, bathing, dressing and toileting Advanced ADL's: None  Transfers:  bed mobility, bed to chair, toilet, tub/shower, car, furniture and floor Locomotion:  ambulation, wheelchair mobility and stairs  Additional Impairments:  Leisure Awareness and Discharge Disposition  Anticipated Outcomes Item Anticipated Outcome  Eating/Swallowing  Independent with set up assist  Basic self-care  Min Assist  Tolieting  Mod Assist  Bowel/Bladder  Min assist  Transfers  Mod I  Locomotion  Supervision gait/ w/c mobility  Communication  Independent  Cognition    Pain  Min assist; pain will be managed at 3 or less  Safety/Judgment  Min assist  Other     Therapy Plan: PT Intensity: Minimum of 1-2 x/day ,45 to 90 minutes PT Frequency: 5 out of 7 days PT Duration Estimated Length of Stay: 2weeks OT Intensity: Minimum of 1-2 x/day, 45 to 90 minutes OT Frequency: 5 out of 7 days OT Duration/Estimated Length of Stay: 2 weeks      Team Interventions: Item RN PT OT SLP SW TR Other  Self Care/Advanced ADL Retraining   x      Neuromuscular Re-Education         Therapeutic Activities  x x      UE/LE Strength Training/ROM  x x      UE/LE Coordination Activities  x x      Visual/Perceptual Remediation/Compensation         DME/Adaptive Equipment Instruction  xx x      Therapeutic Exercise   x      Balance/Vestibular Training  x x      Patient/Family Education x x x      Cognitive Remediation/Compensation           Functional Mobility Training  x x      Ambulation/Gait Training  x       Museum/gallery curator  x       Wheelchair Propulsion/Positioning  x x      Functional Pension scheme manager         Bladder Management         Bowel Management x        Disease Management/Prevention x        Pain Management x x x      Medication Management x        Skin Care/Wound Management x        Splinting/Orthotics         Discharge Planning x x x      Psychosocial Support x x x                             Team Discharge Planning: Destination: PT-Home ,OT- Home , SLP-  Projected Follow-up: PT-Home health PT, OT-  None, SLP-  Projected Equipment Needs: PT- , OT- Tub/shower seat;3 in 1 bedside comode;Wheelchair (measurements), SLP-  Patient/family involved in discharge planning: PT-  ,  OT-Patient;Family member/caregiver, SLP-   MD ELOS: 2 weeks Medical Rehab Prognosis:  Good Assessment: The patient has been admitted for CIR therapies. The team will be addressing, functional mobility, strength, stamina, balance, safety, adaptive techniques/equipment, self-care, bowel and bladder mgt, patient and caregiver education, pain mgt, nutrition. Goals have been set at minimal to moderate assist. Pain is and likely will be a major inhibiting factor.      See Team Conference Notes for weekly updates to the plan of care

## 2012-04-27 NOTE — Progress Notes (Signed)
Justin Henson 10:16 AM  Subjective: Patient is working hard in rehabilitation and like his new room and has no GI complaints unfortunately his biopsy is not back yet  Objective: Vital signs stable afebrile no acute distress except for knee and leg pain abdomen is soft nontender  Assessment: Metastatic cancer awaiting pathology on colonoscopy Plan: See yesterday's note for additional info will await pathology and please call us if endoscopy is needed and will be on standby to help when necessary  [facility]

## 2012-04-27 NOTE — Evaluation (Signed)
Physical Therapy Assessment and Plan  Patient Details  Name: Justin Henson MRN: [id number] Date of Birth: May 05, 1945  PT Diagnosis: Pain in joint, Muscle weakness Rehab Potential: Good ELOS: 2weeks   Today's Date: 04/27/2012 [id number] Calculation (min): 55 min Skilled interventions:PT evaluation and initial treatment performed. Patient complains of pain 10/10 in R Hip and does not allow to touch or reposition LE. Bed mobility training included rolling to L side with stand by assist due to patients refusal to offered assistance, supine to sit EOB , Sit to stand with minA, Stand pivot T/F to w/c with minA. Patient requires increased amount of time to complete any task, does not want any assistance,gets easily distracted and looses his focus on task. FWW, and w/c with elevated leg rests have been provided. Problem List: muscle weakness, 50% PWB on RLE, Increased pai  Assessment & PlanStuart E Henson is a 67 y.o. right-handed male with unremarkable past medical history. Patient was independent prior to admission. Admitted 04/21/2012 with right hip pain x6 weeks. He was evaluated initially in the orthopedic office and x-ray showed an avulsion of the lesser trochanter and there was concern that this was causing his pain. 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 There was also significant other lesions in the pelvis per CT of abdomen and pelvis showed a large mass within the proximal transverse colon measuring 7.4 x 4.4 x 4.8 cm. In addition a lucent lesion within the T9 vertebral body measured 10 mm was noted. Patient was admitted on 04/21/2012 and underwent intramedullary nail 04/22/2012 per Dr. Luiz Blare. Advise 50 pound partial weightbearing to right lower extremity.     PT - End of Session Activity Tolerance: Tolerates < 10 min activity, no significant change in vital signs Endurance  Deficit: Yes Endurance Deficit Description: Patient requieres increased rest time beetween tasks. PT Assessment Rehab Potential: Good Barriers to Discharge: Other (comment) (Patient's compliance) PT Plan PT Intensity: Minimum of 1-2 x/day ,45 to 90 minutes PT Frequency: 5 out of 7 days PT Duration Estimated Length of Stay: 2weeks PT Treatment/Interventions: Ambulation/gait training;Functional mobility training;Neuromuscular re-education;Therapeutic Exercise;UE/LE Strength taining/ROM PT Recommendation Recommendations for Other Services: Neuropsych consult Follow Up Recommendations: Home health PT Patient destination: Home  PT Evaluation Precautions/Restrictions Precautions Precautions: Other (comment) (50 %PWB on R LE) Restrictions Weight Bearing Restrictions: Yes General   Vital SignsTherapy Vitals Temp: 99.3 F (37.4 C) Temp src: Oral Pulse Rate: 92  Resp: 18  BP: 134/72 mmHg Patient Position, if appropriate: Lying Oxygen Therapy SpO2: 93 % O2 Device: None (Room air) Pain   Home Living/Prior Functioning Home Living Lives With: Spouse Available Help at Discharge: Family;Friend(s) Type of Home: House Home Access: Stairs to enter Entrance Stairs-Rails: None Home Layout: One level Bathroom Shower/Tub: Health visitor: Handicapped height Bathroom Accessibility: Yes Home Adaptive Equipment: Crutches;Walker - rolling Prior Function Level of Independence: Independent with basic ADLs Able to Take Stairs?: Yes Driving: Yes Vocation: Full time employment Vision/Perception  Vision - History Baseline Vision: Wears glasses all the time  Cognition Overall Cognitive Status: Appears within functional limits for tasks assessed Orientation Level: Oriented X4 Sensation Sensation Light Touch: Appears Intact Stereognosis: Appears Intact Hot/Cold: Appears Intact Proprioception: Appears Intact Coordination Gross Motor Movements are Fluid and Coordinated:  Yes Motor  Motor Motor: Within Functional Limits  Mobility Bed Mobility Rolling Right: 4: Min guard Rolling Left: 4: Min guard Left Sidelying to Sit: 4:  Min guard Supine to Sit: 4: Min assist Supine to Sit Details: Verbal cues for precautions/safety;Verbal cues for technique;Verbal cues for sequencing Sitting - Scoot to Edge of Bed: 4: Min guard Sitting - Scoot to [employer] of Bed Details: Verbal cues for precautions/safety;Verbal cues for technique Sit to Supine: 4: Min guard Transfers Sit to Stand: 4: Min guard Stand to Sit: 4: Min guard Stand Pivot Transfers: 4: Min Actuary Details: Verbal cues for technique;Verbal cues for sequencing Locomotion  Ambulation Ambulation/Gait Assistance: Not tested (comment) Gait Gait: No Gait Pattern: Impaired Stairs / Additional Locomotion Stairs: No Wheelchair Mobility Wheelchair Mobility: Yes Wheelchair Assistance: 3: Mod Education officer, museum: Both upper extremities Wheelchair Parts Management: Supervision/cueing Distance: Able to [employer] around the room <20 feet  Trunk/Postural Assessment  Postural Control Postural Control: Within Functional Limits  Extremity Assessment  RUE Assessment RUE Assessment: Within Functional Limits LUE Assessment LUE Assessment: Within Functional Limits RLE Assessment RLE Assessment: Not tested (Patient does not allow for the LE to be touched, ) LLE Assessment LLE Assessment: Within Functional Limits  FIM:  FIM - Banker Devices: Therapist, occupational: 6: More than reasonable amt of time;4: Supine > Sit: Min A (steadying Pt. > 75%/lift 1 leg);4: Bed > Chair or W/C: Min A (steadying Pt. > 75%) FIM - Locomotion: Wheelchair Distance: Able to [employer] around the room <20 feet Locomotion: Wheelchair: 1: Travels less than 50 ft with minimal assistance (Pt.>75%) FIM - Locomotion: Ambulation Ambulation/Gait Assistance: Not tested  (comment) Locomotion: Ambulation: 0: Activity did not occur FIM - Locomotion: Stairs Locomotion: Stairs: 0: Activity did not occur   Refer to Care Plan for Long Term Goals   Discharge Criteria: Patient will be discharged from PT if patient refuses treatment 3 consecutive times without medical reason, if treatment goals not met, if there is a change in medical status, if patient makes no progress towards goals or if patient is discharged from hospital.  The above assessment, treatment plan, treatment alternatives and goals were discussed and mutually agreed upon: by patient and by family  Dorna Mai 04/27/2012, 4:13 PM

## 2012-04-27 NOTE — Progress Notes (Signed)
Patient ID: Justin Henson, male   DOB: 05-10-1945, 67 y.o.   MRN: [id number]  Subjective/Complaints: Slept poorly.  "Alot on my mind" Review of Systems  Musculoskeletal: Positive for joint pain.       L hip with movement  All other systems reviewed and are negative.   Objective: Vital Signs: Blood pressure 118/70, pulse 91, temperature 98.4 F (36.9 C), temperature source Oral, resp. rate 20, SpO2 94.00%. No results found. Results for orders placed during the hospital encounter of 04/21/12 (from the past 72 hour(s))  CBC     Status: Abnormal   Collection Time   04/24/12  9:14 PM      Component Value Range Comment   WBC 10.4  4.0 - 10.5 K/uL    RBC 4.67  4.22 - 5.81 MIL/uL    Hemoglobin 10.4 (*) 13.0 - 17.0 g/dL    HCT 40.9 (*) 81.1 - 52.0 %    MCV 70.4 (*) 78.0 - 100.0 fL    MCH 22.3 (*) 26.0 - 34.0 pg    MCHC 31.6  30.0 - 36.0 g/dL    RDW 91.4 (*) 78.2 - 15.5 %    Platelets 214  150 - 400 K/uL   CREATININE, SERUM     Status: Abnormal   Collection Time   04/24/12  9:14 PM      Component Value Range Comment   Creatinine, Ser 0.87  0.50 - 1.35 mg/dL    GFR calc non Af Amer 88 (*) >90 mL/min    GFR calc Af Amer >90  >90 mL/min   COMPREHENSIVE METABOLIC PANEL     Status: Abnormal   Collection Time   04/25/12  6:15 AM      Component Value Range Comment   Sodium 138  135 - 145 mEq/L    Potassium 3.3 (*) 3.5 - 5.1 mEq/L    Chloride 99  96 - 112 mEq/L    CO2 27  19 - 32 mEq/L    Glucose, Bld 101 (*) 70 - 99 mg/dL    BUN 10  6 - 23 mg/dL    Creatinine, Ser 9.56  0.50 - 1.35 mg/dL    Calcium 8.6  8.4 - 21.3 mg/dL    Total Protein 5.6 (*) 6.0 - 8.3 g/dL    Albumin 2.9 (*) 3.5 - 5.2 g/dL    AST 17  0 - 37 U/L    ALT 8  0 - 53 U/L    Alkaline Phosphatase 88  39 - 117 U/L    Total Bilirubin 0.6  0.3 - 1.2 mg/dL    GFR calc non Af Amer 90 (*) >90 mL/min    GFR calc Af Amer >90  >90 mL/min   CBC WITH DIFFERENTIAL     Status: Abnormal   Collection Time   04/25/12  6:15 AM   Component Value Range Comment   WBC 9.2  4.0 - 10.5 K/uL    RBC 4.51  4.22 - 5.81 MIL/uL    Hemoglobin 10.1 (*) 13.0 - 17.0 g/dL    HCT 08.6 (*) 57.8 - 52.0 %    MCV 70.5 (*) 78.0 - 100.0 fL    MCH 22.4 (*) 26.0 - 34.0 pg    MCHC 31.8  30.0 - 36.0 g/dL    RDW 46.9 (*) 62.9 - 15.5 %    Platelets 179  150 - 400 K/uL    Neutrophils Relative 70  43 - 77 %    Neutro Abs  6.5  1.7 - 7.7 K/uL    Lymphocytes Relative 12  12 - 46 %    Lymphs Abs 1.1  0.7 - 4.0 K/uL    Monocytes Relative 14 (*) 3 - 12 %    Monocytes Absolute 1.3 (*) 0.1 - 1.0 K/uL    Eosinophils Relative 4  0 - 5 %    Eosinophils Absolute 0.3  0.0 - 0.7 K/uL    Basophils Relative 1  0 - 1 %    Basophils Absolute 0.1  0.0 - 0.1 K/uL   BASIC METABOLIC PANEL     Status: Abnormal   Collection Time   04/26/12  6:40 AM      Component Value Range Comment   Sodium 140  135 - 145 mEq/L    Potassium 3.4 (*) 3.5 - 5.1 mEq/L    Chloride 102  96 - 112 mEq/L    CO2 26  19 - 32 mEq/L    Glucose, Bld 91  70 - 99 mg/dL    BUN 9  6 - 23 mg/dL    Creatinine, Ser 1.61  0.50 - 1.35 mg/dL    Calcium 8.8  8.4 - 09.6 mg/dL    GFR calc non Af Amer >90  >90 mL/min    GFR calc Af Amer >90  >90 mL/min      HEENT: normal Cardio: RRR Resp: CTA B/L GI: BS positive and Distention Extremity:  Edema R thigh Skin:   Wound C/D/I Neuro: Alert/Oriented, Anxious, Normal Sensory and Abnormal Motor pain inhibition R HF, KE, rated 1/5 Musc/Skel:  Extremity tender R hip GEN NAD   Assessment/Plan: 1. 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 Physiatrist is providing close team supervision and 24 hour management of active medical problems listed below. Physiatrist and rehab team continue to assess barriers to discharge/monitor patient progress toward functional and medical  goals. FIM:                   Comprehension Comprehension Mode: Auditory Comprehension: 6-Follows complex conversation/direction: With extra time/assistive device  Expression Expression Mode: Verbal Expression: 6-Expresses complex ideas: With extra time/assistive device  Social Interaction Social Interaction: 7-Interacts appropriately with others - No medications needed.  Problem Solving Problem Solving: 6-Solves complex problems: With extra time  Memory Memory: 7-Complete Independence: No helper  Medical Problem List and Plan:  1. 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 DVT Prophylaxis/Anticoagulation: Subcutaneous Lovenox. Monitor platelet counts any signs of bleeding  3. Pain Management: Hydrocodone, Robaxin as needed monitor with increased mobility. May need to consider scheduled narcotics  4. Neuropsych: This patient is capable of making decisions on his/her own behalf.  5. Postoperative anemia. Latest hemoglobin 10.2. Followup CBC. Continue iron supplement.  6. Oncology. Await formal pathology report and staging as directed per Dr. Myrle Sheng with followup plan as an outpatient       LOS (Days) 1 A FACE TO FACE EVALUATION WAS PERFORMED  KIRSTEINS,ANDREW E 04/27/2012, 6:46 AM

## 2012-04-28 ENCOUNTER — Inpatient Hospital Stay (HOSPITAL_COMMUNITY): Payer: 59 | Admitting: *Deleted

## 2012-04-28 ENCOUNTER — Inpatient Hospital Stay (HOSPITAL_COMMUNITY): Payer: Medicare Other

## 2012-04-28 ENCOUNTER — Encounter (HOSPITAL_COMMUNITY): Payer: Medicare Other

## 2012-04-28 ENCOUNTER — Inpatient Hospital Stay (HOSPITAL_COMMUNITY): Payer: 59 | Admitting: Physical Therapy

## 2012-04-28 ENCOUNTER — Encounter (HOSPITAL_COMMUNITY): Payer: Self-pay | Admitting: Gastroenterology

## 2012-04-28 DIAGNOSIS — M84459D Pathological fracture, hip, unspecified, subsequent encounter for fracture with routine healing: Secondary | ICD-10-CM

## 2012-04-28 DIAGNOSIS — M84453A Pathological fracture, unspecified femur, initial encounter for fracture: Secondary | ICD-10-CM

## 2012-04-28 MED ORDER — MORPHINE SULFATE ER 15 MG PO TBCR
30.0000 mg | EXTENDED_RELEASE_TABLET | Freq: Two times a day (BID) | ORAL | Status: DC
  Administered 2012-04-28 – 2012-04-29 (×3): 30 mg via ORAL
  Filled 2012-04-28 (×3): qty 2

## 2012-04-28 NOTE — Plan of Care (Signed)
Problem: RH SAFETY Goal: RH STG ADHERE TO SAFETY PRECAUTIONS W/ASSISTANCE/DEVICE STG Adhere to Safety Precautions With Assistance/Device. Supervision Outcome: Progressing Calls appropriately for assistance     

## 2012-04-28 NOTE — Progress Notes (Signed)
Physical Therapy Session Note  Patient Details  Name: Justin Henson MRN: [id number] Date of Birth: 11/04/1945  Today's Date: 04/28/2012 Time: 1100-1154 Time Calculation (min): 54 min  Short Term Goals: Week 1:  PT Short Term Goal 1 (Week 1): Patient will be able to T/F in/out of bed Supervised , within 3 min  and w/o signs of excrusiating pain PT Short Term Goal 2 (Week 1): Patient will  be able to maintain standing position for up to 2 min with PWB. PT Short Term Goal 3 (Week 1): Patient will be able to T/F bed to w/c with supervision ,presenting PT Short Term Goal 4 (Week 1): Patient will ambulate with FWW ,maintaining WB precautions 30 feet with minA  Skilled Therapeutic Interventions/Progress Updates:    Patient received supine in bed. Patient refuses hands on assistance when transferring supine>sit, stating he "cannot tolerate anyone else moving his leg". Patient continues to refuse hands on assistance throughout session, with all mobility, and uses a towel under R femur to assist with lifting it (with both bed mobility or when repositioning R LE on wheelchair leg rest). Patient requires increased time for bed mobility and transfers during session.  Patient instructed in wheelchair mobility x200' with supervision required for verbal cues for negotiation of obstacles and people in hallways. Attempted to initiate seated LE strengthening exercises, but patient reports he has too much pain. Adjusted R foot rest higher to increase patient comfort.  Patient returned to room and performed stand pivot transfer wheelchair>bed, requiring increased time to complete. Patient transferred sit>supine with use of towel behind R femur to assist with lifting R LE. Patient requires min assist to lift L LE into bed. Patient repositions with supervision and cues for improved comfort and positioning. Patient left supine in bed with all needs within reach.  Therapy Documentation Precautions:   Precautions Precautions: Fall Restrictions Weight Bearing Restrictions: Yes RLE Weight Bearing: Partial weight bearing RLE Partial Weight Bearing Percentage or Pounds: 50 % Pain: Pain Assessment Pain Assessment: No/denies pain (reports he only has pain with certain movements) Pain Score: 0-No pain Pain Location: Groin Pain Orientation: Right Pain Intervention(s): Repositioned;Ambulation/increased activity Mobility: Bed Mobility Bed Mobility: Sit to Supine;Supine to Sit Supine to Sit: 5: Supervision;With rails;Other (comment) (Refuses hands on assistance secondary to pain.) Supine to Sit Details: Verbal cues for precautions/safety;Verbal cues for technique;Verbal cues for sequencing Supine to Sit Details (indicate cue type and reason): Patient refuses hands on assistance during bed mobility, stating he "cannot tolerate anyone else moving his leg"; requires increased time to complete Sit to Supine: 4: Min assist;HOB flat;With rail Sit to Supine - Details: Manual facilitation for placement;Manual facilitation for weight shifting;Verbal cues for technique;Verbal cues for sequencing;Verbal cues for precautions/safety Sit to Supine - Details (indicate cue type and reason): Patient requires assistance for elevation of L LE; requires increased time to complete Transfers Stand Pivot Transfers: 4: Min assist;From elevated surface;With armrests Stand Pivot Transfer Details: Verbal cues for sequencing;Verbal cues for technique;Verbal cues for precautions/safety;Manual facilitation for weight shifting Stand Pivot Transfer Details (indicate cue type and reason): Patient transfers with very forward flexed posture (RW set low on purpose); puts all weight on L LE during transfer and pivots only on L LE, no weight bearing on R LE. Locomotion : Ambulation Ambulation/Gait Assistance: Not tested (comment) Naval architect Mobility: Yes Wheelchair Assistance: 5: Supervision Wheelchair  Assistance Details: Verbal cues for precautions/safety;Verbal cues for technique;Verbal cues for sequencing Wheelchair Propulsion: Both upper extremities Wheelchair Parts Management: Needs  assistance Distance: 200   See FIM for current functional status  Therapy/Group: Individual Therapy  Chipper Herb. Trezure Cronk, PT, DPT  04/28/2012, 4:18 PM

## 2012-04-28 NOTE — Progress Notes (Signed)
Patient information reviewed and entered into e[facility] system by Otha Monical, RN, CRRN, PPS Coordinator.  Information including medical coding and functional independence measure will be reviewed and updated through discharge.     Per nursing patient was given "Data Collection Information Summary for Patients in Inpatient Rehabilitation Facilities with attached "Privacy Act Statement-Health Care Records" upon admission.  

## 2012-04-28 NOTE — Plan of Care (Signed)
Problem: RH SKIN INTEGRITY Goal: RH STG SKIN FREE OF INFECTION/BREAKDOWN Outcome: Progressing No additional skin breakdown at this time

## 2012-04-28 NOTE — Progress Notes (Signed)
Occupational Therapy Session Note  Patient Details  Name: Justin Henson MRN: [id number] Date of Birth: 01-May-1945  Today's Date: 04/28/2012 Time: 0700-0805 Time Calculation (min): 65 min  Short Term Goals: Week 1:  OT Short Term Goal 1 (Week 1): Pateint will complete all funcitonal transfers with modified independence (in less than 3 min using DME/AE, prn) OT Short Term Goal 2 (Week 1): Patient will completed grooming, upper body bathing and dressing seated at sink side with set-up assist OT Short Term Goal 3 (Week 1): Patient will complete lower body bathing and dressing using AE, prn, with moderate assist OT Short Term Goal 4 (Week 1): Patient will complete toileting with modified independence (extra time)  Skilled Therapeutic Interventions/Progress Updates:    Pt in bed resting with wife at bedside.  Pt initially requested to remain in bed but with encouragement from wife patient agreed to participating in bathing and dressing tasks w/c level at sink.  Pt stated that the pain in his RLE (primarily in groin area at joint) is "excrutiating" with movement.  Pt did not want anyone touching his leg or assisting him with bed mobility, stand pivot transfers, sit<>stand, or standing.  Pt requires extra time to complete all tasks with rest breaks between tasks.  Wife offered encouragement throughout session.  Focus on activity tolerance, sit<>stand, standing balance, and safety awareness.  Therapy Documentation Precautions:  Precautions Precautions: Other (comment) (50 %PWB on R LE) Restrictions Weight Bearing Restrictions: Yes RLE Weight Bearing: Partial weight bearing RLE Partial Weight Bearing Percentage or Pounds: 50 Pain: Pain Assessment Pain Assessment: 0-10 Pain Score:   8 Pain Location: Groin Pain Orientation: Right Pain Descriptors: Aching Pain Onset: With Activity Pain Intervention(s): RN made aware (RN admin meds during session)  See FIM for current functional  status  Therapy/Group: Individual Therapy  Rich Brave 04/28/2012, 9:01 AM

## 2012-04-28 NOTE — Progress Notes (Signed)
Patient ID: Justin Henson, male   DOB: 11/30/1945, 67 y.o.   MRN: [id number]  Subjective/Complaints: Right hip tender, cannot lift against gravity. Up with OT already Review of Systems  Musculoskeletal: Positive for joint pain.       r hip with movement  All other systems reviewed and are negative.   Objective: Vital Signs: Blood pressure 152/80, pulse 86, temperature 98.3 F (36.8 C), temperature source Oral, resp. rate 18, SpO2 94.00%. No results found. Results for orders placed during the hospital encounter of 04/26/12 (from the past 72 hour(s))  CBC WITH DIFFERENTIAL     Status: Abnormal   Collection Time   04/27/12  6:30 AM      Component Value Range Comment   WBC 6.6  4.0 - 10.5 K/uL    RBC 4.07 (*) 4.22 - 5.81 MIL/uL    Hemoglobin 9.2 (*) 13.0 - 17.0 g/dL    HCT 08.6 (*) 57.8 - 52.0 %    MCV 71.7 (*) 78.0 - 100.0 fL    MCH 22.6 (*) 26.0 - 34.0 pg    MCHC 31.5  30.0 - 36.0 g/dL    RDW 46.9 (*) 62.9 - 15.5 %    Platelets 210  150 - 400 K/uL    Neutrophils Relative 63  43 - 77 %    Neutro Abs 4.1  1.7 - 7.7 K/uL    Lymphocytes Relative 21  12 - 46 %    Lymphs Abs 1.4  0.7 - 4.0 K/uL    Monocytes Relative 11  3 - 12 %    Monocytes Absolute 0.7  0.1 - 1.0 K/uL    Eosinophils Relative 5  0 - 5 %    Eosinophils Absolute 0.4  0.0 - 0.7 K/uL    Basophils Relative 1  0 - 1 %    Basophils Absolute 0.1  0.0 - 0.1 K/uL   COMPREHENSIVE METABOLIC PANEL     Status: Abnormal   Collection Time   04/27/12  6:30 AM      Component Value Range Comment   Sodium 141  135 - 145 mEq/L    Potassium 3.3 (*) 3.5 - 5.1 mEq/L    Chloride 102  96 - 112 mEq/L    CO2 28  19 - 32 mEq/L    Glucose, Bld 102 (*) 70 - 99 mg/dL    BUN 10  6 - 23 mg/dL    Creatinine, Ser 5.28  0.50 - 1.35 mg/dL    Calcium 8.8  8.4 - 41.3 mg/dL    Total Protein 5.5 (*) 6.0 - 8.3 g/dL    Albumin 2.8 (*) 3.5 - 5.2 g/dL    AST 18  0 - 37 U/L    ALT 9  0 - 53 U/L    Alkaline Phosphatase 85  39 - 117 U/L    Total  Bilirubin 0.6  0.3 - 1.2 mg/dL    GFR calc non Af Amer 86 (*) >90 mL/min    GFR calc Af Amer >90  >90 mL/min      HEENT: normal Cardio: RRR Resp: CTA B/L GI: BS positive and Distention Extremity:  Edema R thigh Skin:   Wound C/D/I Neuro: Alert/Oriented, Anxious, Normal Sensory and Abnormal Motor pain inhibition R HF, KE, rated 1/5 Musc/Skel:  Extremity tender R hip, near iliopsoas, RF, worsened with palpation and HF. GEN NAD   Assessment/Plan: 1. 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 Physiatrist is providing close team supervision and 24 hour management of active medical problems listed below. Physiatrist and rehab team continue to assess barriers to discharge/monitor patient progress toward functional and medical goals. FIM: FIM - Bathing Bathing: 0: Activity did not occur  FIM - Upper Body Dressing/Undressing Upper body dressing/undressing: 0: Activity did not occur FIM - Lower Body Dressing/Undressing Lower body dressing/undressing: 0: Activity did not occur  FIM - Toileting Toileting: 0: Activity did not occur     FIM - Banker Devices: Therapist, occupational: 6: More than reasonable amt of time;4: Supine > Sit: Min A (steadying Pt. > 75%/lift 1 leg);4: Bed > Chair or W/C: Min A (steadying Pt. > 75%)  FIM - Locomotion: Wheelchair Distance: Able to [employer] around the room <20 feet Locomotion: Wheelchair: 1: Travels less than 50 ft with minimal assistance (Pt.>75%) FIM - Locomotion: Ambulation Ambulation/Gait Assistance: Not tested (comment) Locomotion: Ambulation: 0: Activity did not occur  Comprehension Comprehension Mode: Auditory Comprehension: 6-Follows complex conversation/direction: With extra time/assistive device  Expression Expression  Mode: Verbal Expression: 6-Expresses complex ideas: With extra time/assistive device  Social Interaction Social Interaction: 7-Interacts appropriately with others - No medications needed.  Problem Solving Problem Solving: 6-Solves complex problems: With extra time  Memory Memory: 7-Complete Independence: No helper  Medical Problem List and Plan:  1. 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 DVT Prophylaxis/Anticoagulation: Subcutaneous Lovenox. Monitor platelet counts any signs of bleeding  3. Pain Management: Hydrocodone, Robaxin as needed monitor with increased mobility.   -increase mscontin to [id number]. Continues to have substantial pain in hip flexion on right, likely due to pain from iliopsoas muscel/trochanteric lesion 4. Neuropsych: This patient is capable of making decisions on his/her own behalf.  5. Postoperative anemia. Latest hemoglobin 10.2. Followup CBC. Continue iron supplement.  6. Oncology. 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 Myrle Sheng with followup plan as an outpatient       LOS (Days) 2 A FACE TO FACE EVALUATION WAS PERFORMED  SWARTZ,ZACHARY T 04/28/2012, 7:43 AM

## 2012-04-28 NOTE — Progress Notes (Signed)
Social Work Patient ID: Justin Henson, male   DOB: 1946/03/12, 67 y.o.   MRN: [id number]  Attempted to see pt today for psychosocial assessment, however, pt reports he is too fatigued to do this.  Asks that I come back tomorrow.  Aizley Stenseth, LCSW

## 2012-04-28 NOTE — Plan of Care (Signed)
Problem: RH PAIN MANAGEMENT Goal: RH STG PAIN MANAGED AT OR BELOW PT'S PAIN GOAL Outcome: Progressing <5   

## 2012-04-28 NOTE — Plan of Care (Signed)
Problem: RH BLADDER ELIMINATION Goal: RH STG MANAGE BLADDER WITH ASSISTANCE STG Manage Bladder With Assistance. Mod I Outcome: Progressing Using urinal at this time.

## 2012-04-28 NOTE — Progress Notes (Signed)
Recreational Therapy Session Note  Patient Details  Name: Justin Henson MRN: [id number] Date of Birth: Jul 07, 1945 Today's Date: 04/28/2012  Pt participated in animal assisted activity/therapy bed level.  Lauralye Kinn 04/28/2012, 4:05 PM

## 2012-04-28 NOTE — Progress Notes (Signed)
Occupational Therapy Note  Patient Details  Name: BERTRAN ZEIMET MRN: [id number] Date of Birth: 12-24-1945 Today's Date: 04/28/2012  Time: 1405-1430 Pt c/o increased pain in right groin (6/10) with movement; repositioned Individual Therapy Focus on transfers, bed mobility, and discharge planning.  Pt performed all transfers and bed mobility with supervision.  Pt required extra time to complete all tasks and refuses physical assistance.  Pt stated he would like a BSC and agreed to practice tub bench transfers tomorrow to determine if device will be appropriate for home bathroom.   Lavone Neri [facility] 04/28/2012, 2:55 PM

## 2012-04-28 NOTE — Progress Notes (Signed)
Physical Therapy Session Note  Patient Details  Name: Justin Henson MRN: [id number] Date of Birth: 1945-10-05  Today's Date: 04/28/2012 Time: 5621-3086 Time Calculation (min): 60 min  Short Term Goals: Week 1:  PT Short Term Goal 1 (Week 1): Patient will be able to T/F in/out of bed Supervised , within 3 min  and w/o signs of excrusiating pain PT Short Term Goal 2 (Week 1): Patient will  be able to maintain standing position for up to 2 min with PWB. PT Short Term Goal 3 (Week 1): Patient will be able to T/F bed to w/c with supervision ,presenting PT Short Term Goal 4 (Week 1): Patient will ambulate with FWW ,maintaining WB precautions 30 feet with minA  Skilled Therapeutic Interventions/Progress Updates:    Pt self limiting during session.  Reports he has already been up twice today and that his right leg is hurting.  Not sure he understands the pain rating scale.  He reports 3-4/10 pain, but his facial grimacing indicates more like 8-9/10 pain.   RN made aware and brought pain meds during session.  Bed mobility extremely slow supervision, not wanting outside help from therapist using towel to loop under thigh to move right leg to EOB.  Pt reports that the only way he can get into and out of bed is with his hip/knee at a 90 degree angle.  Pt using bed rail, towel and HOB elevated ~20 degrees to get to side of bed.  Sit to stand min assist to stabilize walker as he transitions his hands from bed rail to RW.  Min guard assist for transfer for safety and min assist to stabilize RW during transition to sit in WC.  Verbal cues for safety and reviewing PWB 50% status of right leg.  Pt is likely only PWB, though due to pain.  Pt only agreeable to WC mobility (150' with supervision, pt taking rest breaks to lean to his left to decrease pressure in the chair on his right groin/upper thigh) but no more standing or attempts at gait even though he did well with pivotal steps to the WC.  He is self limiting  due to pain, but when he does move he moves with very little outside assistance, albeit slowly.    Therapy Documentation Precautions:  Precautions Precautions: Fall Restrictions Weight Bearing Restrictions: Yes RLE Weight Bearing: Partial weight bearing RLE Partial Weight Bearing Percentage or Pounds: 50 % General:   Vital Signs: Therapy Vitals Temp: 99.2 F (37.3 C) Temp src: Oral Pulse Rate: 102  Resp: 20  BP: 115/73 mmHg Patient Position, if appropriate: Lying Oxygen Therapy SpO2: 90 % O2 Device: None (Room air) Pain: Pain Assessment Pain Assessment: No/denies pain (reports he only has pain with certain movements) Pain Score: 0-No pain Pain Location: Groin Pain Orientation: Right Pain Intervention(s): Repositioned;Ambulation/increased activity Mobility:   Locomotion : Ambulation Ambulation/Gait Assistance: Not tested (comment) Wheelchair Mobility Distance: 150   See FIM for current functional status  Therapy/Group: Individual Therapy  Lurena Joiner B. Takima Encina, PT, DPT [phone]   04/28/2012, 4:14 PM

## 2012-04-29 ENCOUNTER — Inpatient Hospital Stay (HOSPITAL_COMMUNITY): Payer: 59

## 2012-04-29 ENCOUNTER — Inpatient Hospital Stay (HOSPITAL_COMMUNITY): Payer: 59 | Admitting: Occupational Therapy

## 2012-04-29 ENCOUNTER — Inpatient Hospital Stay (HOSPITAL_COMMUNITY): Payer: 59 | Admitting: Physical Therapy

## 2012-04-29 NOTE — Consult Note (Signed)
Patient's pathology just shows adenoma and it was discussed with the patient's wife and we discussed how these were just superficial biopsies of a large lesion and how it might be misleading which she understands but how the oncology team would like an endoscopy just to be sure and she agrees and we will proceed tomorrow at noon if okay with the rehabilitation team

## 2012-04-29 NOTE — Progress Notes (Signed)
Physical Therapy Session Note  Patient Details  Name: Justin Henson MRN: [id number] Date of Birth: 1945/06/21  Today's Date: 04/29/2012 Time: 0901-1003 Time Calculation (min): 62 min  Short Term Goals: Week 1:  PT Short Term Goal 1 (Week 1): Patient will be able to T/F in/out of bed Supervised , within 3 min  and w/o signs of excrusiating pain PT Short Term Goal 2 (Week 1): Patient will  be able to maintain standing position for up to 2 min with PWB. PT Short Term Goal 3 (Week 1): Patient will be able to T/F bed to w/c with supervision ,presenting PT Short Term Goal 4 (Week 1): Patient will ambulate with FWW ,maintaining WB precautions 30 feet with minA  Skilled Therapeutic Interventions/Progress Updates:    This session focused on WC mobility supervision >150' with bil arms level tiled surfaces.  Sit to stand in parallel bars with supervision with progression to gait 3-5 steps in parallel bars with supervision both forward and backward.  Pt had difficulty progressing his right leg forward (he externally rotates it and adducts to bring it forward ).  Pt unable to go further due to reports of pain and lightheadedness.  Stood again with supervision, but was unable to walk again.  TE seated: LAQs x 10 each bil L with 5 sec hold R only small motion (maybe 1" from the floor), but still AROM.  Heel slides seated right x 10 each.  Heel and toe raises bil x 10 each seated AROM.  Transfers WC to bed supervision, extra time needed to complete task with RW.  Sit to supine supervision with cues for technique, pt using towel to move leg at all times.  Pt has Std. Walker at home, so if we are not going to pursue a RW at this time we need to start practicing with a Std. Walker.    Therapy Documentation Precautions:  Precautions Precautions: Fall Restrictions Weight Bearing Restrictions: Yes RLE Weight Bearing: Partial weight bearing RLE Partial Weight Bearing Percentage or Pounds: 50    Pain: Pain  Assessment Pain Score:   2 Mobility:   Locomotion : Ambulation Ambulation/Gait Assistance: 5: Supervision Wheelchair Mobility Distance: 150   See FIM for current functional status  Therapy/Group: Individual Therapy  Lurena Joiner B. Aylen Stradford, PT, DPT [phone]   04/29/2012, 1:17 PM

## 2012-04-29 NOTE — Progress Notes (Signed)
Occupational Therapy Session Notes  Patient Details  Name: Justin Henson MRN: [id number] Date of Birth: [birth year]  Today's Date: 04/29/2012  Short Term Goals: Week 1:  OT Short Term Goal 1 (Week 1): Pateint will complete all funcitonal transfers with modified independence (in less than 3 min using DME/AE, prn) OT Short Term Goal 2 (Week 1): Patient will completed grooming, upper body bathing and dressing seated at sink side with set-up assist OT Short Term Goal 3 (Week 1): Patient will complete lower body bathing and dressing using AE, prn, with moderate assist OT Short Term Goal 4 (Week 1): Patient will complete toileting with modified independence (extra time)  Skilled Therapeutic Interventions/Progress Updates:   Session #1 0730-08 Individual Therapy Patient with 2/10 complaints of pain in right hip, requested pain medication; RN made aware and gave meds Patient found supine in bed. Patient refused bathing with therapist. Patient remained supine in bed to donn shorts using reacher to increase independence. Patient then sat edge of bed to donn bilateral socks, using sock aid for RLE to increase independence. From there patient performed stand pivot transfer using rolling walker into w/c with steady assist from therapist. Patient takes more than reasonable amount of time to complete tasks, but is motivated to be as independent as possible. After ADL therapist propelled patient -> ADL apartment to introduce tub transfer bench, patient eager to practice transfer soon. Therapist propelled patient back to room and left seated in w/c beside bed with call bell & phone within reach.   Session #2 1130-1200 - 30 Minutes Individual Therapy Patient with "jabbing" complaints of pain, no rate given; RN aware Patient found supine in bed. Therapist set-up BSC over toilet seat for safe and effective toilet transfers. Patient engaged in bed mobility and transferred edge of bed -> w/c with close  supervision & therapist holding rolling walker in place. Therapist then propelled patient into bathroom for toilet transfer onto [facility] seated over toilet seat using grab bars prn to increase independence with transfer. Patient does best with standing with both hands pushing from the surface he is sitting and grabbing onto grab bar or rolling walker and then start to pivot and feel back to sit down. At end of session left patient seated in w/c with call bell & phone within reach. Patient takes more than reasonable amount of time to complete tasks.   Precautions:  Precautions Precautions: Fall Restrictions Weight Bearing Restrictions: Yes RLE Weight Bearing: Partial weight bearing RLE Partial Weight Bearing Percentage or Pounds: 50%  See FIM for current functional status  [place] Natarajan 04/29/2012, 8:04 AM

## 2012-04-29 NOTE — Progress Notes (Signed)
Occupational Therapy Note  Patient Details  Name: Justin Henson MRN: [id number] Date of Birth: [birth year] Today's Date: 04/29/2012  Time: 1345-1430 Pt denies pain except with right hip flexion (then "I see galaxies...not  just stars") Individual Therapy Pt in bed with wife at bedside.  Pt agreed to bed mobility tasks but declined to get OOB.  Discussed with wife and patient DME recommendations.  Pt stated he would like BSC and would try tub bench transfer tomorrow.  Pt is supervision for bed mobility and does not want any physical assistance.  Lavone Neri [facility] 04/29/2012, 3:00 PM

## 2012-04-29 NOTE — Progress Notes (Signed)
Subjective:    The patient complains of severe pain in his right hip with any weightbearing or rotation of his hip. Patient reports pain as 7 on 0-10 scale.    Objective: Vital signs in last 24 hours: Temp:  [97.9 F (36.6 C)-99.2 F (37.3 C)] 97.9 F (36.6 C) (01/14 0505) Pulse Rate:  [85-102] 85  (01/14 0505) Resp:  [18-20] 18  (01/14 0505) BP: (115-127)/(66-73) 127/66 mmHg (01/14 0505) SpO2:  [90 %-95 %] 95 % (01/14 0505)  Intake/Output from previous day: 01/13 0701 - 01/14 0700 In: 720 [P.O.:720] Out: -  Intake/Output this shift:     Basename 04/27/12 0630  HGB 9.2*    Basename 04/27/12 0630  WBC 6.6  RBC 4.07*  HCT 29.2*  PLT 210    Basename 04/27/12 0630  NA 141  K 3.3*  CL 102  CO2 28  BUN 10  CREATININE 0.91  GLUCOSE 102*  CALCIUM 8.8   No results found for this basename: LABPT:2,INR:2 in the last 72 hours right hip exam: Severe pain with flexion or any rotation of the right hip.  Dressings are clean and dry.  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 Plan: Continue inpatient rehabilitation. Partial weightbearing on right.  (50% as tolerated) Will rex-ray right femur.  We need to be sure and get a good lateral of the hip.   Equilla Que G 04/29/2012, 10:01 AM

## 2012-04-29 NOTE — Progress Notes (Signed)
Patient ID: Justin Henson, male   DOB: [birth year], 67 y.o.   MRN: [id number]  Subjective/Complaints: Pain got better last night. Slept until commotion started in the hallway in middle of night. Review of Systems  Musculoskeletal: Positive for joint pain.       r hip with movement  All other systems reviewed and are negative.   Objective: Vital Signs: Blood pressure 127/66, pulse 85, temperature 97.9 F (36.6 C), temperature source Oral, resp. rate 18, SpO2 95.00%. No results found. Results for orders placed during the hospital encounter of 04/26/12 (from the past 72 hour(s))  CBC WITH DIFFERENTIAL     Status: Abnormal   Collection Time   04/27/12  6:30 AM      Component Value Range Comment   WBC 6.6  4.0 - 10.5 K/uL    RBC 4.07 (*) 4.22 - 5.81 MIL/uL    Hemoglobin 9.2 (*) 13.0 - 17.0 g/dL    HCT 14.7 (*) 82.9 - 52.0 %    MCV 71.7 (*) 78.0 - 100.0 fL    MCH 22.6 (*) 26.0 - 34.0 pg    MCHC 31.5  30.0 - 36.0 g/dL    RDW 56.2 (*) 13.0 - 15.5 %    Platelets 210  150 - 400 K/uL    Neutrophils Relative 63  43 - 77 %    Neutro Abs 4.1  1.7 - 7.7 K/uL    Lymphocytes Relative 21  12 - 46 %    Lymphs Abs 1.4  0.7 - 4.0 K/uL    Monocytes Relative 11  3 - 12 %    Monocytes Absolute 0.7  0.1 - 1.0 K/uL    Eosinophils Relative 5  0 - 5 %    Eosinophils Absolute 0.4  0.0 - 0.7 K/uL    Basophils Relative 1  0 - 1 %    Basophils Absolute 0.1  0.0 - 0.1 K/uL   COMPREHENSIVE METABOLIC PANEL     Status: Abnormal   Collection Time   04/27/12  6:30 AM      Component Value Range Comment   Sodium 141  135 - 145 mEq/L    Potassium 3.3 (*) 3.5 - 5.1 mEq/L    Chloride 102  96 - 112 mEq/L    CO2 28  19 - 32 mEq/L    Glucose, Bld 102 (*) 70 - 99 mg/dL    BUN 10  6 - 23 mg/dL    Creatinine, Ser 8.65  0.50 - 1.35 mg/dL    Calcium 8.8  8.4 - 78.4 mg/dL    Total Protein 5.5 (*) 6.0 - 8.3 g/dL    Albumin 2.8 (*) 3.5 - 5.2 g/dL    AST 18  0 - 37 U/L    ALT 9  0 - 53 U/L    Alkaline Phosphatase 85   39 - 117 U/L    Total Bilirubin 0.6  0.3 - 1.2 mg/dL    GFR calc non Af Amer 86 (*) >90 mL/min    GFR calc Af Amer >90  >90 mL/min      HEENT: normal Cardio: RRR Resp: CTA B/L GI: BS positive and Distention Extremity:  Edema R thigh Skin:   Wound C/D/I Neuro: Alert/Oriented, Anxious, Normal Sensory and Abnormal Motor pain inhibition R HF, KE, rated 1/5 Musc/Skel:  Extremity tender R hip, near iliopsoas, RF, worsened with palpation and HF.he is able to lift leg off bed today GEN NAD   Assessment/Plan: 1. 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 Physiatrist is providing close team supervision and 24 hour management of active medical problems listed below. Physiatrist and rehab team continue to assess barriers to discharge/monitor patient progress toward functional and medical goals. FIM: FIM - Bathing Bathing Steps Patient Completed: Chest;Right Arm;Left Arm;Abdomen;Front perineal area;Buttocks Bathing: 3: Mod-Patient completes 5-7 58f 10 parts or 50-74%  FIM - Upper Body Dressing/Undressing Upper body dressing/undressing steps patient completed: Thread/unthread right sleeve of pullover shirt/dresss;Thread/unthread left sleeve of pullover shirt/dress;Put head through opening of pull over shirt/dress;Pull shirt over trunk Upper body dressing/undressing: 5: Set-up assist to: Obtain clothing/put away FIM - Lower Body Dressing/Undressing Lower body dressing/undressing: 1: Total-Patient completed less than 25% of tasks  FIM - Toileting Toileting: 0: Activity did not occur     FIM - Banker Devices: Walker;HOB elevated;Bed rails;Arm rests Bed/Chair Transfer: 5: Supine > Sit: Supervision (verbal cues/safety issues);4: Bed > Chair or W/C: Min A (steadying Pt. > 75%);4: Chair or  W/C > Bed: Min A (steadying Pt. > 75%)  FIM - Locomotion: Wheelchair Distance: 200 Locomotion: Wheelchair: 5: Travels 150 ft or more: maneuvers on rugs and over door sills with supervision, cueing or coaxing FIM - Locomotion: Ambulation Ambulation/Gait Assistance: Not tested (comment) Locomotion: Ambulation: 0: Activity did not occur  Comprehension Comprehension Mode: Auditory Comprehension: 6-Follows complex conversation/direction: With extra time/assistive device  Expression Expression Mode: Verbal Expression: 6-Expresses complex ideas: With extra time/assistive device  Social Interaction Social Interaction: 6-Interacts appropriately with others with medication or extra time (anti-anxiety, antidepressant).  Problem Solving Problem Solving: 6-Solves complex problems: With extra time  Memory Memory: 7-Complete Independence: No helper  Medical Problem List and Plan:  1. 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 DVT Prophylaxis/Anticoagulation: Subcutaneous Lovenox. Monitor platelet counts any signs of bleeding  3. Pain Management: Hydrocodone, Robaxin as needed monitor with increased mobility.   -increased mscontin to 42mq12. Continues to have substantial pain in hip flexion on right, likely due to pain from iliopsoas muscel/trochanteric lesion  -will have to pace pt and move to tolerance. May not have ambulation goals if pain doesn't improve 4. Neuropsych: This patient is capable of making decisions on his/her own behalf.  5. Postoperative anemia. Continue iron supp.  6. Oncology. 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 Myrle Sheng with followup plan as an outpatient       LOS (Days) 3 A FACE TO FACE EVALUATION WAS PERFORMED  Takeysha Bonk T 04/29/2012, 6:50 AM

## 2012-04-30 ENCOUNTER — Encounter (HOSPITAL_COMMUNITY): Payer: Self-pay | Admitting: *Deleted

## 2012-04-30 ENCOUNTER — Ambulatory Visit (HOSPITAL_COMMUNITY): Admit: 2012-04-30 | Payer: Self-pay | Admitting: Gastroenterology

## 2012-04-30 ENCOUNTER — Ambulatory Visit: Payer: Medicare Other

## 2012-04-30 ENCOUNTER — Encounter (HOSPITAL_COMMUNITY)
Admission: RE | Disposition: A | Discharge status: Home Health | Payer: Self-pay | Source: Intra-hospital | Attending: Physical Medicine & Rehabilitation

## 2012-04-30 ENCOUNTER — Inpatient Hospital Stay (HOSPITAL_COMMUNITY): Payer: 59

## 2012-04-30 ENCOUNTER — Inpatient Hospital Stay (HOSPITAL_COMMUNITY): Payer: 59 | Admitting: Physical Therapy

## 2012-04-30 ENCOUNTER — Inpatient Hospital Stay (HOSPITAL_COMMUNITY): Payer: Medicare Other | Admitting: Physical Therapy

## 2012-04-30 ENCOUNTER — Ambulatory Visit: Payer: Medicare Other | Admitting: Radiation Oncology

## 2012-04-30 DIAGNOSIS — M84453A Pathological fracture, unspecified femur, initial encounter for fracture: Secondary | ICD-10-CM

## 2012-04-30 DIAGNOSIS — M84459D Pathological fracture, hip, unspecified, subsequent encounter for fracture with routine healing: Secondary | ICD-10-CM

## 2012-04-30 HISTORY — PX: ESOPHAGOGASTRODUODENOSCOPY: SHX5428

## 2012-04-30 SURGERY — EGD (ESOPHAGOGASTRODUODENOSCOPY)
Anesthesia: Moderate Sedation

## 2012-04-30 MED ORDER — MORPHINE SULFATE ER 15 MG PO TBCR: 30.0000 mg | EXTENDED_RELEASE_TABLET | Freq: Three times a day (TID) | ORAL | Status: DC

## 2012-04-30 MED ORDER — DIPHENHYDRAMINE HCL 50 MG/ML IJ SOLN
INTRAMUSCULAR | Status: AC
  Filled 2012-04-30: qty 1

## 2012-04-30 MED ORDER — FENTANYL CITRATE 0.05 MG/ML IJ SOLN
INTRAMUSCULAR | Status: AC
  Filled 2012-04-30: qty 2

## 2012-04-30 MED ORDER — SODIUM CHLORIDE 0.9 % IV SOLN
INTRAVENOUS | Status: DC
  Administered 2012-04-30: 500 mL via INTRAVENOUS

## 2012-04-30 MED ORDER — MIDAZOLAM HCL 10 MG/2ML IJ SOLN
INTRAMUSCULAR | Status: DC | PRN
  Administered 2012-04-30 (×2): 2 mg via INTRAVENOUS

## 2012-04-30 MED ORDER — PROMETHAZINE HCL 25 MG PO TABS
25.0000 mg | ORAL_TABLET | Freq: Four times a day (QID) | ORAL | Status: DC | PRN
  Administered 2012-04-30: 25 mg via ORAL
  Filled 2012-04-30: qty 1

## 2012-04-30 MED ORDER — OXYCODONE-ACETAMINOPHEN 5-325 MG PO TABS: 1.0000 | ORAL_TABLET | Freq: Four times a day (QID) | ORAL | Status: DC | PRN

## 2012-04-30 MED ORDER — PROMETHAZINE HCL 25 MG RE SUPP: 25.0000 mg | Freq: Four times a day (QID) | RECTAL | Status: DC | PRN

## 2012-04-30 MED ORDER — FENTANYL CITRATE 0.05 MG/ML IJ SOLN
INTRAMUSCULAR | Status: DC | PRN
  Administered 2012-04-30 (×2): 25 ug via INTRAVENOUS

## 2012-04-30 MED ORDER — ENOXAPARIN SODIUM 30 MG/0.3ML ~~LOC~~ SOLN: 30.0000 mg | Freq: Every day | SUBCUTANEOUS | Status: DC

## 2012-04-30 MED ORDER — MIDAZOLAM HCL 5 MG/ML IJ SOLN
INTRAMUSCULAR | Status: AC
  Filled 2012-04-30: qty 2

## 2012-04-30 MED ORDER — LORATADINE-PSEUDOEPHEDRINE ER 10-240 MG PO TB24: 1.0000 | ORAL_TABLET | Freq: Every day | ORAL | Status: DC

## 2012-04-30 MED ORDER — BUTAMBEN-TETRACAINE-BENZOCAINE 2-2-14 % EX AERO
INHALATION_SPRAY | CUTANEOUS | Status: DC | PRN
  Administered 2012-04-30: 2 via TOPICAL

## 2012-04-30 MED ORDER — SORBITOL 70 % SOLN: 30.0000 mL | Freq: Every day | Status: DC | PRN

## 2012-04-30 MED ORDER — MORPHINE SULFATE ER 15 MG PO TBCR
30.0000 mg | EXTENDED_RELEASE_TABLET | Freq: Three times a day (TID) | ORAL | Status: DC
  Administered 2012-04-30 – 2012-05-02 (×5): 30 mg via ORAL
  Filled 2012-04-30 (×5): qty 2

## 2012-04-30 NOTE — Progress Notes (Signed)
Social Work Patient ID: Justin Henson, male   DOB: May 18, 1945, 67 y.o.   MRN: [id number]  Met yesterday afternoon with patient and wife to review team conference.  Both aware and agreeable with targeted d/c 05/02/12 with modified independent to minimal assistance goals.  Wife confirms that she plans to take FMLA (getting paperwork to me).  Pt eager to d/c home.   Nekita Pita, LCSW

## 2012-04-30 NOTE — Progress Notes (Signed)
Justin Henson 12:27 PM  Subjective: Patient is asymptomatic from a GI standpoint and seems to be doing well in rehabilitation  Objective: Vital signs stable afebrile no acute distress exam please see pre-assessment evaluation no new labs  Assessment: Metastatic cancer worrisome for gastric primary  Plan: Proceed with endoscopy today with further workup and plans and recommendations please see that report  [facility]

## 2012-04-30 NOTE — Op Note (Signed)
[facility] [address] [place], [address]   ENDOSCOPY PROCEDURE REPORT  PATIENT: Justin, Henson  MR#: [id number] BIRTHDATE: 08-04-1945 , 66  yrs. old GENDER: Male  ENDOSCOPIST: Vida Rigger, MD REFERRED XB:JYNWGN Nicholos Johns, M.D.  Mardelle Matte, M.D.  Jodi Geralds, M.D.  PROCEDURE DATE:  04/30/2012 PROCEDURE:   EGD, diagnostic ASA CLASS:   Class II INDICATIONS:rule out gastric malignancy.  MEDICATIONS: Versed 5 mg IV and Fentanyl 50 mcg IV  TOPICAL ANESTHETIC:used  DESCRIPTION OF PROCEDURE:   After the risks benefits and alternatives of the procedure were thoroughly explained, informed consent was obtained.  The Pentax Gastroscope [id number]  endoscope was introduced through the mouth and advanced to the third portion of the duodenum , limited by Without limitations.   The instrument was slowly withdrawn as the mucosa was fully examined.the findings are recorded below the patient tolerated the procedure well and there was no obvious immediate complicated         FINDINGS:1. Minimal duodenal bulbitis 2. Otherwise within normal limits to the third portion of the duodenum without any worrisome lesion seen  COMPLICATIONS:none  ENDOSCOPIC IMPRESSION: above   RECOMMENDATIONS:further workup per oncology consider extra blood work versus PET scan versus proceed with surgery to remove large midtransverse mass and see if final pathology is the same please let us know if we could be of any further assistance  REPEAT EXAM: as needed   _______________________________ Vida Rigger, MD eSigned:  Vida Rigger, MD 04/30/2012 12:59 PM    FA:OZHY Luiz Blare, MD Elias Else, MD Rolm Baptise, MD  PATIENT NAME:  Justin, Henson MR#: [id number]

## 2012-04-30 NOTE — Progress Notes (Signed)
Justin Henson has persistent right hip pain.  Plain x-rays of the right hip yesterday showed mild radiolucency around the screw in the femoral head.  A CT scan evaluation was suggested.  This was ordered and done today.  Results are currently pending.  We will have him be touchdown weightbearing only in the meantime.  We will evaluate the patient tomorrow in conjunction with the CT scan. Dr. Luiz Blare is aware of the situation.

## 2012-04-30 NOTE — Progress Notes (Signed)
Patient ID: Justin Henson, male   DOB: 21-Feb-1946, 67 y.o.   MRN: [id number]  Subjective/Complaints: Pain better this am, essentially because he hasn't moved much.. Review of Systems  Musculoskeletal: Positive for joint pain.       r hip with movement  All other systems reviewed and are negative.   Objective: Vital Signs: Blood pressure 110/50, pulse 80, temperature 98.2 F (36.8 C), temperature source Oral, resp. rate 18, SpO2 98.00%. Dg Femur Right  04/29/2012  *RADIOLOGY REPORT*  Clinical Data: Postop pathologic fracture fixation with persistent pain.  RIGHT FEMUR - 2 VIEW  Comparison:  Findings: The intramedullary rod and dynamic hip screw are stable in position.  There appears to be mild lucency around the screw threads.  This may be mechanical and due to extensive tumor and unstable fracture.  CT may be helpful for further evaluation. Findings discussed with Dr. Luiz Blare.  IMPRESSION:  1.  No obvious change in position of the fixating hardware. 2.  Suspect lucency around the threads of the dynamic hip screw which could suggest mechanical loosening due to an unstable fracture.  Recommend CT for further evaluation.   Original Report Authenticated By: Rudie Meyer, M.D.    No results found for this or any previous visit (from the past 72 hour(s)).   HEENT: normal Cardio: RRR Resp: CTA B/L GI: BS positive and Distention Extremity:  Edema R thigh Skin:   Wound C/D/I Neuro: Alert/Oriented, Anxious, Normal Sensory and Abnormal Motor pain inhibition R HF, KE, rated 1/5 Musc/Skel:  Extremity tender R hip, near iliopsoas, RF, worsened with palpation and HF.he is able to lift leg off bed today GEN NAD   Assessment/Plan: 1. 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 Physiatrist is providing close team supervision and 24  hour management of active medical problems listed below. Physiatrist and rehab team continue to assess barriers to discharge/monitor patient progress toward functional and medical goals. FIM: FIM - Bathing Bathing Steps Patient Completed: Chest;Right Arm;Left Arm;Abdomen;Front perineal area;Buttocks Bathing: 0: Activity did not occur (patient refused with pc)  FIM - Upper Body Dressing/Undressing Upper body dressing/undressing steps patient completed: Thread/unthread right sleeve of pullover shirt/dresss;Thread/unthread left sleeve of pullover shirt/dress;Put head through opening of pull over shirt/dress;Pull shirt over trunk Upper body dressing/undressing: 0: Activity did not occur FIM - Lower Body Dressing/Undressing Lower body dressing/undressing steps patient completed: Thread/unthread right pants leg;Thread/unthread left pants leg;Pull pants up/down;Don/Doff left sock;Don/Doff right sock (using reacher and sock aid) Lower body dressing/undressing: 5: Supervision: Safety issues/verbal cues  FIM - Toileting Toileting: 0: Activity did not occur  FIM - Archivist Transfers Assistive Devices: Bedside commode;Elevated toilet seat;Grab bars;Walker Toilet Transfers: 4-To toilet/BSC: Min A (steadying Pt. > 75%);4-From toilet/BSC: Min A (steadying Pt. > 75%)  FIM - Banker Devices: Walker;Arm rests;Bed rails Bed/Chair Transfer: 5: Sit > Supine: Supervision (verbal cues/safety issues);5: Bed > Chair or W/C: Supervision (verbal cues/safety issues);5: Chair or W/C > Bed: Supervision (verbal cues/safety issues)  FIM - Locomotion: Wheelchair Distance: 150 Locomotion: Wheelchair: 5: Travels 150 ft or more: maneuvers on rugs and over door sills with supervision, cueing or coaxing FIM - Locomotion: Ambulation Locomotion: Ambulation Assistive Devices: Parallel bars Ambulation/Gait Assistance: 5: Supervision Locomotion: Ambulation: 1: Travels less  than 50 ft with supervision/safety issues  Comprehension Comprehension Mode: Auditory Comprehension: 6-Follows complex conversation/direction: With extra  time/assistive device  Expression Expression Mode: Verbal Expression: 6-Expresses complex ideas: With extra time/assistive device  Social Interaction Social Interaction: 6-Interacts appropriately with others with medication or extra time (anti-anxiety, antidepressant).  Problem Solving Problem Solving: 5-Solves complex 90% of the time/cues < 10% of the time  Memory Memory: 6-More than reasonable amt of time  Medical Problem List and Plan:  1. 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 DVT Prophylaxis/Anticoagulation: Subcutaneous Lovenox. Monitor platelet counts any signs of bleeding  3. Pain Management: Hydrocodone, Robaxin as needed monitor with increased mobility.   -increased mscontin to 80mq8 hr to allow improve activity tolerance  -will have to pace pt and move to tolerance. May not have much in the way of ambulation goals if pain doesn't improve  -xray shows ?loosening around threads of hip screw. Ortho to follow up. Would keep TDWB for now 4. Neuropsych: This patient is capable of making decisions on his/her own behalf.  5. Postoperative anemia. Continue iron supp.  6. Oncology. 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 Myrle Sheng with followup plan as an outpatient   -endoscopy today  -path on colonoscopy with adenoma      LOS (Days) 4 A FACE TO FACE EVALUATION WAS PERFORMED  SWARTZ,ZACHARY T 04/30/2012, 6:51 AM

## 2012-04-30 NOTE — Progress Notes (Signed)
Occupational Therapy Session Note  Patient Details  Name: Justin Henson MRN: [id number] Date of Birth: 1945-11-22  Today's Date: 04/30/2012 Time: 1000-1040 Time Calculation (min): 40 min  Short Term Goals: Week 1:  OT Short Term Goal 1 (Week 1): Pateint will complete all funcitonal transfers with modified independence (in less than 3 min using DME/AE, prn) OT Short Term Goal 2 (Week 1): Patient will completed grooming, upper body bathing and dressing seated at sink side with set-up assist OT Short Term Goal 3 (Week 1): Patient will complete lower body bathing and dressing using AE, prn, with moderate assist OT Short Term Goal 4 (Week 1): Patient will complete toileting with modified independence (extra time)  Skilled Therapeutic Interventions/Progress Updates:    Pt seated in w/c upon arrival with wife present.  Encouraged patient to complete bathing and dressing tasks with sit<>stand from w/c.  Pt stated he could not/would not be able to do it with sit<>stand.  Pt stated he wanted to do is at bed level.  Pt performed stand pivot transfer w/c ->bed with standard walker at supervision level.  Pt performed sit->supine at supervision level.  Pt continues to use towel under right upper leg to assist with mobility of RLE with hip flexion.  Pt completed bathing and dressing tasks at supervision level at bed level.  Discussed home routine with patient and wife.  Issued toilet aid for patient to use for toilet hygiene.  Focus on activity tolerance, discharge planning, family education, and safety awareness.  Therapy Documentation Precautions:  Precautions Precautions: Fall Restrictions Weight Bearing Restrictions: Yes RLE Weight Bearing: Partial weight bearing RLE Partial Weight Bearing Percentage or Pounds: 50 General: General Amount of Missed OT Time (min): 20 Minutes Missed Time Reason: RN time to prepare patient for transport to CT    Pain: Pain Assessment Pain Assessment: No/denies  pain  See FIM for current functional status  Therapy/Group: Individual Therapy  Justin Henson 04/30/2012, 10:44 AM

## 2012-04-30 NOTE — Patient Care Conference (Signed)
Inpatient RehabilitationTeam Conference and Plan of Care Update Date: 04/29/2012   Time: 2:15 PM    Patient Name: Justin Henson      Medical Record Number: [id number]  Date of Birth: [birth year] Sex: Male         Room/Bed: 4004/4004-01 Payor Info: Payor: MEDICARE  Plan: MEDICARE PART A  Product Type: *No Product type*     Admitting Diagnosis: fracture right hip  Admit Date/Time:  04/26/2012  6:11 PM Admission Comments: No comment available   Primary Diagnosis:  Pathologic fracture of femur Principal Problem: Pathologic fracture of femur  Patient Active Problem List   Diagnosis Date Noted  . Pathologic fracture of femur 04/28/2012    Expected Discharge Date: Expected Discharge Date: 05/02/12  Team Members Present: Physician leading conference: Dr. 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 continued severe right hip pain.   working on pain control,   cancer work up, pain mgt, adaptive techniques   Bowel/Bladder   Continent of bowel and bladder. LBM 04/25/12. Receiving daily Senna  Continent of bowel and bladder.  Bowel movement q 2 days. Monitor for intervention   Swallow/Nutrition/ Hydration             ADL's   overall supervision -> min assist  overall mod I  ADL retraining, education on AE prn, pain management, sit/stands, transfers, dynamic standing, UE strengthening   Mobility   supervision supine to sit bed mobility, min assist sit to supine, min assist stand pivot with RW (to stabilize RW not to physically assist pt), pt refused gait all day.  WC mobility supervision >150', pt is self limiting due to pain.  Even though he is moving well, he refuses  to do much more than stand pivot at this time (no ther ex no gait).  Need to clarify if wife is going to be home 24 hours.    mod I bed mobility and transfers, supervision gait, supervision WC mobility, min assist one STE no rail.    pain management, bed mobility, transfers, WC mobility (pt has yet to let us walk or do leg exercises with him today).     Communication             Safety/Cognition/ Behavioral Observations            Pain   Scheduled Morphine CR 30mg  q 12hrs, Oxy IR 5-10mg  q 4hrs, Robaxin 500mg  q 6hrs, prn  <4  Offer pain medication 1 hr prior to initial therapy session, offer prn between scheduled medication for pain control   Skin   R hip incision x 2 with staples intact. Upper hip incision with serousangious drainage with Allevyn dressing intact. Lower hip incision with staples intact, open to air, unremarkable  No additional skin breakdown  Monitor hip incision q shift for appropriate healing    Rehab Goals Patient on target to meet rehab goals: Yes *See Interdisciplinary Assessment and Plan and progress notes for long and short-term goals  Barriers to Discharge: severe pain, destructive femoral lesions    Possible Resolutions to Barriers:  pwb, adaptive equipment, adaptive techniques, pain mgt    Discharge Planning/Teaching Needs:  home with wife to assist as needed  Team Discussion:  Pain continues to remain as primary barrier - most likely due to lesion.  PT concern with whether pt will be able to handle entry steps.  Pt has had some loss of therapy time due to pain and team feels he may need to be pushed to participate at times.  May need to change mobility goals/ distance  Revisions to Treatment Plan:  None at this time.   Continued Need for Acute Rehabilitation Level of Care: The patient requires daily medical management by a physician with specialized training in physical medicine and rehabilitation for the following conditions: Daily direction of a  multidisciplinary physical rehabilitation program to ensure safe treatment while eliciting the highest outcome that is of practical value to the patient.: Yes Daily medical management of patient stability for increased activity during participation in an intensive rehabilitation regime.: Yes Daily analysis of laboratory values and/or radiology reports with any subsequent need for medication adjustment of medical intervention for : Post surgical problems;Other (cancer, severe pain)  [name] 04/30/2012, 2:31 PM

## 2012-04-30 NOTE — Progress Notes (Addendum)
Physical Therapy Note  Patient Details  Name: Justin Henson MRN: [id number] Date of Birth: 1945-09-03 Today's Date: 04/30/2012  1115 Pt missed 45 minute PT session- off floor for testing 1345    Pt missed 30 minute PT session- off floor for testing Justin Henson,[name] 04/30/2012, 7:27 AM

## 2012-04-30 NOTE — Progress Notes (Addendum)
Physical Therapy Session Note  Patient Details  Name: Justin Henson MRN: [id number] Date of Birth: Aug 24, 1945  Today's Date: 04/30/2012 [id number]  Short Term Goals: Week 1:  PT Short Term Goal 1 (Week 1): Patient will be able to T/F in/out of bed Supervised , within 3 min  and w/o signs of excrusiating pain PT Short Term Goal 2 (Week 1): Patient will  be able to maintain standing position for up to 2 min with PWB. PT Short Term Goal 3 (Week 1): Patient will be able to T/F bed to w/c with supervision ,presenting PT Short Term Goal 4 (Week 1): Patient will ambulate with FWW ,maintaining WB precautions 30 feet with minA  Skilled Therapeutic Interventions/Progress Updates:    Pt seated in bathroom wife in room reporting he doesn't feel good (he vomited this AM).  He has not had a BM "in days" and is sitting in the bathroom trying to have one.  I spoke breifly with both pt and wife re: possible d/c Friday-of course this is dependent on testing and medical status as well, but physically we need to see him get into and out of a car and up and down his one STE at home with Std Walker vs crutches.  They both verbalized agreement.  I then helped pt off of 3-in-1 in bathroom supervision with verbal cues on new TDWB status and safe hand placement.  I am not sure that he actually maintained TDWB during his transfer, but he verbalized "hardly any weight, like you are stepping on an egg shell".  Min assist to back WC out of bathroom x 15'.  He did not feel he could participate in PT session at this time, but is willing to try for his OT session later this AM.  I put a Std Walker in his room due to this is what he has at home.  Missed time 51 min.    Therapy Documentation Precautions:  Precautions Precautions: Fall Restrictions Weight Bearing Restrictions: Yes RLE Weight Bearing: Partial weight bearing RLE Partial Weight Bearing Percentage or Pounds: 50 General:   Vital Signs: Therapy  Vitals Temp: 98.2 F (36.8 C) Temp src: Oral Pulse Rate: 80  BP: 110/50 mmHg Patient Position, if appropriate: Lying Oxygen Therapy SpO2: 98 % O2 Device: None (Room air)  See FIM for current functional status  Therapy/Group: Individual Therapy  Lurena Joiner B. Woods Gangemi, PT, DPT [phone]   04/30/2012, 8:03 AM

## 2012-04-30 NOTE — Progress Notes (Signed)
Social Work  Social Work Assessment and Plan  Patient Details  Name: Justin Henson MRN: [id number] Date of Birth: 03-07-1946  Today's Date: 04/30/2012  Problem List:  Patient Active Problem List  Diagnosis  . Pathologic fracture of femur   Past Medical History:  Past Medical History  Diagnosis Date  . Cancer 04/24/2012    lesions currently under diagnosis  . Shortness of breath     on exertion  . Arthritis    Past Surgical History:  Past Surgical History  Procedure Date  . Femur im nail 04/22/2012    Procedure: INTRAMEDULLARY (IM) NAIL FEMORAL;  Surgeon: Harvie Junior, MD;  Location: [facility] OR;  Service: Orthopedics;  Laterality: Right;  PROPHYLACTIC  IM ROD RIGHT FEMUR   . Colonoscopy 04/25/2012    Procedure: COLONOSCOPY;  Surgeon: Petra Kuba, MD;  Location: [facility] END[facility];  Service: Endoscopy;  Laterality: N/A;   Social History:  reports that he has never smoked. He has never used smokeless tobacco. He reports that he does not drink alcohol or use illicit drugs.  Family / Support Systems Marital Status: Married How Long?: 34 years Patient Roles: Spouse;Parent Spouse/Significant Other: wife, Justin Henson @ [phone] Children: son, Justin Henson @ [phone] Anticipated Caregiver: Wife: Justin Henson & Son, Justin Henson Ability/Limitations of Caregiver: None Caregiver Availability: 24/7 Family Dynamics: wife very involved and attentive - planning to take FMLA  Social History Preferred language: English Religion: Baptist Cultural Background: NA Education: Bachelors Degree Read: Yes Write: Yes Employment Status: Retired Date Retired/Disabled/Unemployed: Oct 2013 (primarily due to hip problems) Legal Hisotry/Current Legal Issues: none Guardian/Conservator: none   Abuse/Neglect    Emotional Status Pt's affect, behavior adn adjustment status: Pt lying in bed and willing to complete assessment, however, c/o much fatigue and hip pain.  Answers questions directly and  denies any significant emotional distress ".... nothing beyond just frustration with not being at home and all this pain..."  Pt offers brief answers to questions, however, when we begin to talk about his carvings that he and his wife had done (in his room), he became much more animated and smiling.  Seemed to welcome talking about something other than his medical issues. Recent Psychosocial Issues: None Pyschiatric History: None Substance Abuse History: none  Patient / Family Perceptions, Expectations & Goals Pt/Family understanding of illness & functional limitations: Pt and wife with general understanding that he has a multiple lesions and that path reports are still pending.  They are aware that radiation oncology is to being in next couple of weeks.   Premorbid pt/family roles/activities: Pt has had declining function over past several weeks - prior to decline, pt was working full-time and completely independent. Anticipated changes in roles/activities/participation: Wife to resume caregiver support role as she was providing just prior to admit Pt/family expectations/goals: "I want to go home.Marland KitchenMarland KitchenI was managing like this beforehand"  [employer]: None Premorbid Home Care/DME Agencies: None Transportation available at discharge: yes Resource referrals recommended: Support group (specify)  Discharge Planning Living Arrangements: Spouse/significant other;Children Support Systems: Spouse/significant other;Children Type of Residence: Private residence Insurance Resources: Media planner (specify) Education officer, museum) Financial Resources: Restaurant manager, fast food Screen Referred: No Living Expenses: Database administrator Management: Patient Do you have any problems obtaining your medications?: No Home Management: Home with wife to assist as she was PTA Patient/Family Preliminary Plans: Pt hopeful he will be home by end of week. Wife to take a FMLA. Social Work Anticipated Follow Up  Needs: HH/OP Expected length of  stay: 7-10 days  Clinical Impression Pt pleasant and oriented, however, very frustrated with his hip pain and functional limitations. Wife very supportive and plans to take FMLA.  Both just beginning to deal with this new diagnosis and await final path reports.  Pt eager to get home ASAP.  Justin Henson 04/30/2012, 3:15 PM

## 2012-05-01 ENCOUNTER — Inpatient Hospital Stay (HOSPITAL_COMMUNITY): Payer: 59

## 2012-05-01 ENCOUNTER — Inpatient Hospital Stay (HOSPITAL_COMMUNITY): Payer: 59 | Admitting: Physical Therapy

## 2012-05-01 ENCOUNTER — Encounter (HOSPITAL_COMMUNITY): Payer: Self-pay | Admitting: Gastroenterology

## 2012-05-01 DIAGNOSIS — C7951 Secondary malignant neoplasm of bone: Secondary | ICD-10-CM

## 2012-05-01 DIAGNOSIS — C801 Malignant (primary) neoplasm, unspecified: Secondary | ICD-10-CM

## 2012-05-01 DIAGNOSIS — R918 Other nonspecific abnormal finding of lung field: Secondary | ICD-10-CM

## 2012-05-01 DIAGNOSIS — K639 Disease of intestine, unspecified: Secondary | ICD-10-CM

## 2012-05-01 MED ORDER — SENNOSIDES-DOCUSATE SODIUM 8.6-50 MG PO TABS
2.0000 | ORAL_TABLET | Freq: Two times a day (BID) | ORAL | Status: DC
  Administered 2012-05-01 (×2): 2 via ORAL
  Filled 2012-05-01 (×2): qty 2

## 2012-05-01 NOTE — Plan of Care (Signed)
Problem: RH Wheelchair Mobility Goal: LTG Patient will propel w/c in controlled environment (PT) LTG: Patient will propel wheelchair in controlled environment, # of feet with assist (PT)  Outcome: Not Met (add Reason) Not tested today due to time restraints.  Earlier note indicated min assist and note from 2 days ago supervision.   Goal: LTG Patient will propel w/c in home environment (PT) LTG: Patient will propel wheelchair in home environment, # of feet with assistance (PT).  Outcome: Not Met (add Reason) Did not have time to test today.  Note for earlier today put him at min assist.

## 2012-05-01 NOTE — Progress Notes (Signed)
Occupational Therapy Session Note  Patient Details  Name: Justin Henson MRN: [id number] Date of Birth: [birth year]  Today's Date: 05/01/2012  Session 1 Time: 0800-0858 Time Calculation (min): 58 min  Short Term Goals: Week 1:  OT Short Term Goal 1 (Week 1): Pateint will complete all funcitonal transfers with modified independence (in less than 3 min using DME/AE, prn) OT Short Term Goal 2 (Week 1): Patient will completed grooming, upper body bathing and dressing seated at sink side with set-up assist OT Short Term Goal 3 (Week 1): Patient will complete lower body bathing and dressing using AE, prn, with moderate assist OT Short Term Goal 4 (Week 1): Patient will complete toileting with modified independence (extra time)  Skilled Therapeutic Interventions/Progress Updates:    Pt engaged in bathing at shower level and dressing with sit<>stand from EOB.  [name] present for education and provided appropriate level of supervision and encouragement throughout session.  Focus on activity tolerance, education, safety awareness, transfers, and standing balance.  Pt completed all tasks at supervision level.  Therapy Documentation Precautions:  Precautions Precautions: Fall Restrictions Weight Bearing Restrictions: Yes RLE Weight Bearing: Partial weight bearing RLE Partial Weight Bearing Percentage or Pounds: 50   Pain Assessment Pain Assessment: 0-10 Pain Score:   6 Pain Type: Acute pain Pain Location: Hip Pain Orientation: Right Pain Descriptors: Sore Pain Frequency: Occasional Pain Onset: Gradual Patients Stated Pain Goal: 2 Pain Intervention(s):RN aware; repositioned Multiple Pain Sites: No  See FIM for current functional status  Therapy/Group: Individual Therapy  Session 2 Time: 1145-1200 Pt denies pain Individual Therapy Pt resting in bed.  Focus on bed mobility, discharge planning, safety awareness, and activity tolerance.  Discussed importance of home routine and  energy conservation.  Pt stated he is excited to go home tomorrow and has no further questions at this time.  Lavone Neri [facility] 05/01/2012, 9:02 AM

## 2012-05-01 NOTE — Discharge Summary (Signed)
NAMECANNON, ARREOLA             ACCOUNT NO.:  [id number]  MEDICAL RECORD NO.:  [id number]  LOCATION:  4004                         FACILITY:  [facility]  PHYSICIAN:  Mariam Dollar, P.A.  DATE OF BIRTH:  05/28/1945  DATE OF ADMISSION:  04/26/2012 DATE OF DISCHARGE:  05/02/2012                              DISCHARGE SUMMARY   DISCHARGE DIAGNOSES:  Pathologic right femur fracture with IM nailing, subcutaneous Lovenox for DVT prophylaxis, pain management, and postoperative anemia  HISTORY OF PRESENT ILLNESS:  This is a 67 year old right-handed male with unremarkable past history, who was independent prior to admission, admitted on April 21, 2012, with right hip pain x6 weeks.  He was evaluated initially in the orthopedic office where x-ray showed an avulsion of the lesser trochanter, and there was concern that this was causing his pain.  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  There was also significant other lesions in the pelvis.  CT of the abdomen showed a large mass within the proximal transverse colon, measuring 7.4 x 4.4 x 4.8 cm.  In addition, a lucent lesion within the thoracic T9 vertebral body measured 10 mm.  He was admitted, underwent intramedullary nail on April 22, 2012 per Dr. Luiz Blare and advised partial weightbearing.  He is maintained on subcutaneous Lovenox for DVT prophylaxis.  Followup Oncology Services, Dr. Truett Perna and await formal pathology report and make additional staging treatment recommendations at that time, with Radiation Oncology Services, Dr. Mitzi Hansen, consulted.  The patient underwent colonoscopy with biopsy on April 25, 2012, per Dr. Ewing Schlein for workup of colon mass again with biopsy pending.  Postoperative anemia 10.2 and monitored.  Physical and occupational therapy evaluations were completed.  Ongoing recommendations made for physical medicine  rehab consult and patient was admitted for a comprehensive rehab program.  PAST MEDICAL HISTORY:  Unremarkable.  SOCIAL HISTORY:  Lives with his son, 1 level home, 1 step to entry.  FUNCTIONAL STATUS UPON ADMISSION TO REHAB SERVICES:  He is independent driving, retired.  FUNCTIONAL STATUS UPON ADMISSION TO REHAB SERVICES:  +2 total assist for stand pivot transfers.  Ambulation was not yet tested.  PHYSICAL EXAMINATION:  VITAL SIGNS:  Blood pressure 130/65, pulse 102, temperature 97.6, respirations 18. GENERAL:  This was an alert male, oriented x3. LUNGS:  Clear to auscultation. CARDIAC:  Regular rate and rhythm. ABDOMEN:  Soft, nontender.  Good bowel sounds.  He still had pain along the right inguinal area, worse with hip flexion.  Hip incision wound was dressed without drainage.  REHABILITATION HOSPITAL COURSE:  The patient was admitted to inpatient rehab services with therapies initiated on a 3-hour daily basis consisting of physical therapy, occupational therapy, and 24-hour rehabilitation nursing.  The following issues were addressed during the patient's rehabilitation stay.  Pertaining to Mr. Coone pathologic right femur fracture, he had undergone IM nailing per Dr. Luiz Blare.  He was initially partial weightbearing and changed to touchdown weightbearing after some questionable loosening of hardware, which was ruled out by CT of the hip, but still stressed the need to maintain touchdown weightbearing at this time.  Pain managed with MS  Contin that was titrated to 30 mg every 8 hours as well as the addition of oxycodone for breakthrough pain, as well as Robaxin for muscle spasms. Postoperative anemia stable with no bleeding episodes.  Oncology service continued to follow up and  concerns for awaiting pathology report on colonic mass and staging as directed by Oncology Services and follow up Radiation Oncology Dr. Mitzi Hansen as an outpatient.  He did undergo an endoscopy that  showed some duodenal bulbitis, but otherwise normal.  The patient received weekly collaborative interdisciplinary team conferences to discuss estimated length of stay, family teaching, and any barriers to discharge.  He was continent of bowel and bladder.  Overall supervision minimal assist for activities of daily living, supervision supine to sit, bed mobility, minimal assist sit to supine, minimal assist stand, pivot with a rolling walker.  Stabilize rolling walker. Wheelchair mobility supervision.  He was doing well.  Although refused to do much more than stand pivot at this time.  No other formal ambulation training.  The patient would be discharged to home with his wife with ongoing outpatient treatments as recommended.  DISCHARGE MEDICATIONS:  Xanax 0.5 mg p.o. t.i.d. as needed for anxiety, Robaxin 500 mg p.o. every 6 hours as needed muscle spasms and  dispensed 90 tablets.  Oxycodone immediate release 5 mg 1 or 2 tablets every 4 hours as needed severe pain, dispense of 90 tablets, Claritin 10 mg p.o. daily, MS Contin 30 mg p.o. every 8 hours, and Senokot-S 2 tablets 2 p.o. b.i.d..  DIET:  Regular.  SPECIAL INSTRUCTIONS:  The patient to be touchdown weightbearing to right lower extremity as per Dr. Orrin Brigham of Orthopedic Services.  He would follow up Dr. Truett Perna, Outpatient Oncology Service well as arranges made for Dr. Mitzi Hansen, Radiation Oncology as we await pathology report.  He would see Dr. Harold Hedge on May 20, 2012, at the outpatient rehab center.     Mariam Dollar, P.A.     DA/MEDQ  D:  05/01/2012  T:  05/01/2012  Job:  [id number]  cc:   Ranelle Oyster, M.D. Sigurd Sos, M.D. Radene Gunning, M.D., Ph.D. Elana Alm. Nicholos Johns, M.D.

## 2012-05-01 NOTE — Progress Notes (Signed)
Subjective: 1 week S/p im rod r. Hip.  Pt hip pain much better today     Objective: Vital signs in last 24 hours: Temp:  [98 F (36.7 C)-99.2 F (37.3 C)] 98 F (36.7 C) (01/15 1554) Pulse Rate:  [69] 69  (01/15 1554) Resp:  [14-36] 18  (01/16 0615) BP: (108-146)/(57-91) 123/71 mmHg (01/16 0615) SpO2:  [92 %-98 %] 94 % (01/16 0615)  Intake/Output from previous day: 01/15 0701 - 01/16 0700 In: 720 [P.O.:720] Out: 300 [Urine:300] Intake/Output this shift:    No results found for this basename: HGB:5 in the last 72 hours No results found for this basename: WBC:2,RBC:2,HCT:2,PLT:2 in the last 72 hours No results found for this basename: NA:2,K:2,CL:2,CO2:2,BUN:2,CREATININE:2,GLUCOSE:2,CALCIUM:2 in the last 72 hours No results found for this basename: LABPT:2,INR:2 in the last 72 hours  Neurologically intact Neurovascular intact Sensation intact distally No cellulitis present Compartment soft mild pain on rom  Ct scan: shows no penetration of screw through head.  No lucency around screw Assessment/Plan: 1 week s/p im rod for r femur tumor of proximal femur.  There is no evidence of hip problem related to screw.  Feel pain is related to tumor and stress on the bone which is fixed but still has movement related to severe nature of bone loss in prox femurid // pt to continue toe touch wt bearing only.  Needs radiation therapy to control tumor burden.  F/u Camani Sesay 2 weeks.   Daily Crate L 05/01/2012, 10:58 AM

## 2012-05-01 NOTE — Plan of Care (Signed)
Problem: RH SKIN INTEGRITY Goal: RH STG SKIN FREE OF INFECTION/BREAKDOWN Remain free of skin breakdown  Outcome: Progressing No additional skin issues

## 2012-05-01 NOTE — Progress Notes (Signed)
Patient ID: Justin Henson, male   DOB: 10/18/1945, 67 y.o.   MRN: [id number]  Subjective/Complaints: Nausea better, especially after bm. Pain improved. Didn't get much therapy yesterday because of testing Review of Systems  Musculoskeletal: Positive for joint pain.       r hip with movement  All other systems reviewed and are negative.   Objective: Vital Signs: Blood pressure 123/71, pulse 69, temperature 98 F (36.7 C), temperature source Oral, resp. rate 18, SpO2 94.00%. Dg Femur Right  04/29/2012  *RADIOLOGY REPORT*  Clinical Data: Postop pathologic fracture fixation with persistent pain.  RIGHT FEMUR - 2 VIEW  Comparison:  Findings: The intramedullary rod and dynamic hip screw are stable in position.  There appears to be mild lucency around the screw threads.  This may be mechanical and due to extensive tumor and unstable fracture.  CT may be helpful for further evaluation. Findings discussed with Dr. Luiz Blare.  IMPRESSION:  1.  No obvious change in position of the fixating hardware. 2.  Suspect lucency around the threads of the dynamic hip screw which could suggest mechanical loosening due to an unstable fracture.  Recommend CT for further evaluation.   Original Report Authenticated By: Rudie Meyer, M.D.    Results for orders placed during the hospital encounter of 04/26/12 (from the past 72 hour(s))  CANCER ANTIGEN 19-9     Status: Abnormal   Collection Time   04/30/12 10:52 AM      Component Value Range Comment   CA 19-9 47.1 (*) <35.0 U/mL      HEENT: normal Cardio: RRR Resp: CTA B/L GI: BS positive and Distention Extremity:  Edema R thigh Skin:   Wound C/D/I Neuro: Alert/Oriented, Anxious, Normal Sensory and Abnormal Motor pain inhibition R HF, KE, rated 1/5 Musc/Skel:  Extremity tender R hip, near iliopsoas, RF, worsened with palpation and HF.he is able to lift leg off bed today GEN NAD   Assessment/Plan: 1. 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 Physiatrist is providing close team supervision and 24 hour management of active medical problems listed below. Physiatrist and rehab team continue to assess barriers to discharge/monitor patient progress toward functional and medical goals. FIM: FIM - Bathing Bathing Steps Patient Completed: Chest;Right Arm;Left Arm;Abdomen;Front perineal area;Buttocks;Right upper leg;Left upper leg Bathing: 5: Supervision: Safety issues/verbal cues  FIM - Upper Body Dressing/Undressing Upper body dressing/undressing steps patient completed: Thread/unthread right sleeve of pullover shirt/dresss;Thread/unthread left sleeve of pullover shirt/dress;Put head through opening of pull over shirt/dress;Pull shirt over trunk Upper body dressing/undressing: 5: Supervision: Safety issues/verbal cues FIM - Lower Body Dressing/Undressing Lower body dressing/undressing steps patient completed: Thread/unthread right underwear leg;Thread/unthread left underwear leg;Pull pants up/down;Pull underwear up/down;Thread/unthread left pants leg;Thread/unthread right pants leg Lower body dressing/undressing: 5: Supervision: Safety issues/verbal cues  FIM - Toileting Toileting: 0: Activity did not occur  FIM - Diplomatic Services operational officer Devices: Bedside commode;Grab bars Toilet Transfers: 5-From toilet/BSC: Supervision (verbal cues/safety issues)  FIM - Banker Devices: Environmental consultant;Arm rests;Bed rails Bed/Chair Transfer: 5: Sit > Supine: Supervision (verbal cues/safety issues);5: Bed > Chair or W/C: Supervision (verbal cues/safety issues);5: Chair or W/C > Bed: Supervision (verbal cues/safety issues)  FIM - Locomotion: Wheelchair Distance: 15 Locomotion: Wheelchair: 1: Travels less than 50 ft with minimal assistance  (Pt.>75%) FIM - Locomotion: Ambulation Locomotion: Ambulation Assistive Devices: Parallel bars Ambulation/Gait  Assistance: 5: Supervision Locomotion: Ambulation: 0: Activity did not occur  Comprehension Comprehension Mode: Auditory Comprehension: 6-Follows complex conversation/direction: With extra time/assistive device  Expression Expression Mode: Verbal Expression: 6-Expresses complex ideas: With extra time/assistive device  Social Interaction Social Interaction: 6-Interacts appropriately with others with medication or extra time (anti-anxiety, antidepressant).  Problem Solving Problem Solving: 5-Solves complex 90% of the time/cues < 10% of the time  Memory Memory: 6-More than reasonable amt of time  Medical Problem List and Plan:  1. 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 DVT Prophylaxis/Anticoagulation: Subcutaneous Lovenox. Monitor platelet counts any signs of bleeding  3. Pain Management: Hydrocodone, Robaxin as needed monitor with increased mobility.   -increased mscontin to 105mq8 hr to allow improve activity tolerance and this has helped  -will have to pace pt and move to tolerance. May not have much in the way of ambulation goals if pain doesn't improve  -CT performed. Screw appears to be in reasonable position and bone density fair. Ortho to follow up. Would keep TDWB for now 4. Neuropsych: This patient is capable of making decisions on his/her own behalf.  5. Postoperative anemia. Continue iron supp.  6. Oncology. 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 Nausea: mostly due to constipation. Need to be aggressive with bowel program      LOS (Days) 5 A FACE TO FACE EVALUATION WAS  PERFORMED  Inmer Nix T 05/01/2012, 7:20 AM

## 2012-05-01 NOTE — Discharge Summary (Signed)
  Discharge summary job # [id number]

## 2012-05-01 NOTE — Plan of Care (Signed)
Problem: RH SAFETY Goal: RH STG ADHERE TO SAFETY PRECAUTIONS W/ASSISTANCE/DEVICE STG Adhere to Safety Precautions With Assistance/Device. Supervision  Outcome: Progressing Calls appropriately.     

## 2012-05-01 NOTE — Progress Notes (Signed)
Physical Therapy Session Note  Patient Details  Name: Justin Henson MRN: [id number] Date of Birth: 1945-07-04  Today's Date: 05/01/2012 Time: 0930-1020 Time Calculation (min): 50 min  Short Term Goals: Week 1:  PT Short Term Goal 1 (Week 1): Patient will be able to T/F in/out of bed Supervised , within 3 min  and w/o signs of excrusiating pain PT Short Term Goal 2 (Week 1): Patient will  be able to maintain standing position for up to 2 min with PWB. PT Short Term Goal 3 (Week 1): Patient will be able to T/F bed to w/c with supervision ,presenting PT Short Term Goal 4 (Week 1): Patient will ambulate with FWW ,maintaining WB precautions 30 feet with minA  Skilled Therapeutic Interventions/Progress Updates:  WC propulsion in controlled environment 100' with supervision to improve endurance and UE strengthening.  Patient c/o increased LE pain, declined ambulation.  Sit/stand transfer with min A to steady walker; stand pivot transfer with std. Walker WC/mat table. Sit/supine transfer mod independent.  LE strenthening in supine: ankle pumps x20; quad set, blut set, heel slides, hip a/a, TKE, all 12x each.  Patient required extended rest break between exercises and at all position changes due to high pain level with movement.  Patient transferred supine/sit and sit/stand to std. Walker with close supervision.  WC propulsion from gym back to room by staff.     Therapy Documentation Precautions:  Precautions Precautions: Fall Restrictions Weight Bearing Restrictions: Yes RLE Weight Bearing: Partial weight bearing RLE Partial Weight Bearing Percentage or Pounds: 50   Pain: Pain Assessment Pain Assessment: 0-10 Pain Score:   6 Pain Type: Acute pain Pain Location: Hip Pain Orientation: Right Pain Descriptors: Aching;Cramping Pain Frequency: Occasional Pain Onset: On-going Patients Stated Pain Goal: 2 Pain Intervention(s): RN made aware Multiple Pain Sites: No  See FIM for current  functional status  Therapy/Group: Individual Therapy  Rexene Agent 05/01/2012, 12:14 PM

## 2012-05-01 NOTE — Progress Notes (Signed)
IP PROGRESS NOTE  Subjective:   The right groin pain has improved. He is getting out of bed with physical therapy. He plans on going home within the next few days.   Objective: Vital signs in last 24 hours: Blood pressure 123/71, pulse 69, temperature 98 F (36.7 C), temperature source Oral, resp. rate 18, SpO2 94.00%.  Intake/Output from previous day: 01/15 0701 - 01/16 0700 In: 720 [P.O.:720] Out: 300 [Urine:300]  Physical Exam: Not performed today      Lab Results: CA 19-9 on 04/30/2012-47.1    Studies/Results: Ct Hip Right Wo Contrast  05/01/2012  *RADIOLOGY REPORT*  Clinical Data: Pathologic fracture of the right hip.  Severe pain.  CT OF THE RIGHT HIP WITHOUT CONTRAST  Technique:  Multidetector CT imaging was performed according to the standard protocol. Multiplanar CT image reconstructions were also generated.  Comparison: MRI dated 04/18/2012, CT scan dated 04/21/2012 and radiographs dated 01/07 and 01/09 and 04/29/2012  Findings: The intramedullary rod and compression screw appear in excellent position.  The tip of the screw is adjacent to the fovea but does not protrude beyond the contour of the femoral head. Joint spaces well preserved.  The small metastatic lesion in the right ischial tuberosity seen on MRI is not apparent on this CT scan.  There is destruction of the lesser trochanter with avulsion of the attachment of the iliopsoas tendon.  There is also partial avulsion of attachment quadratus femoris muscle.  IMPRESSION:  1.  The compression screw and intramedullary rod appear in excellent position in the right hip. 2.  Pathologic fracture and destruction of the lesser trochanter with at least partial avulsion of the attachments of the iliopsoas tendon and quadratus femoris muscle.   Original Report Authenticated By: Francene Boyers, M.D.     Medications: I have reviewed the patient's current medications.  Assessment/Plan: 1.Destructive lesion in the proximal right  femur, status post intramedullary rodding of the right femur 04/23/2011, the pathology confirmed metastatic carcinoma. The immunohistochemical profile is not consistent with colon cancer.  - Staging CT evaluation confirming a transverse colon mass, multiple small lung nodules, a lytic lesion at the sternum, and an expansile lesion at the proximal right femur  2. Red cell microcytosis  3. Pain secondary to the right femur lesion  4. Transverse colon mass-status post a colonoscopy 04/25/2012 confirming a transverse colon mass, biopsy consistent with multiple fragments of a tubular adenoma 5. Upper endoscopy 04/30/2012 with no evidence of malignancy  He has metastatic carcinoma of unknown primary. His case was presented at the GI tumor conference on 04/30/2012. Further review of the initial CT scan reveals no evidence of a pancreas mass. I discussed the diagnosis of unknown primary carcinoma with Mr. Geers and his wife. He will be scheduled for outpatient followup. We will schedule an outpatient PET scan. An unknown primary gene array assay cannot be submitted on the decalcified femur tissue.   Recommendations: 1. proceed with palliative radiation to the right femur per Dr. Mitzi Hansen 2. continue physical therapy as recommended by Dr. Riley Kill 3. followup has been scheduled in medical oncology for 05/09/2012        LOS: 5 days   Anaisa Radi  05/01/2012, 2:04 PM

## 2012-05-01 NOTE — Progress Notes (Signed)
Physical Therapy Discharge Summary  Patient Details  Name: Justin Henson MRN: [id number] Date of Birth: 06-27-1945  Today's Date: 05/01/2012 Time: 9528-4132 Time Calculation (min): 57 min  Patient has met 9 of 11 long term goals due to improved activity tolerance, improved balance, increased strength, increased range of motion, decreased pain and ability to compensate for deficits.  Patient to discharge at a wheelchair level Supervision.   Patient's care partner is independent to provide the necessary physical assistance at discharge.  Reasons goals not met: WC mobility was unable to be formally tested due to time restraints and notes from earlier in the day indicated that he did not meet WC goals.    Recommendation:  Patient will benefit from ongoing skilled PT services in home health setting to continue to advance safe functional mobility, address ongoing impairments in right leg strength, ROM, gait, WC mobility, balance, pain activity tolerance, and minimize fall risk.  Equipment: No equipment provided  Reasons for discharge: discharge from hospital and pt/wife happy with functional gains  Patient/family agrees with progress made and goals achieved: Yes  PT Discharge Precautions/Restrictions Precautions Precautions: Fall Restrictions RLE Weight Bearing: Partial weight bearing RLE Partial Weight Bearing Percentage or Pounds: 50 Vital Signs   Pain   Vision/Perception  Vision - History Baseline Vision: Wears glasses all the time Patient Visual Report: No change from baseline Vision - Assessment Vision Assessment: Vision not tested Perception Perception: Within Functional Limits Praxis Praxis: Intact  Cognition Overall Cognitive Status: Appears within functional limits for tasks assessed Arousal/Alertness: Awake/alert Orientation Level: Oriented X4 Sensation Sensation Light Touch: Appears Intact Stereognosis: Appears Intact Hot/Cold: Appears  Intact Proprioception: Appears Intact Coordination Gross Motor Movements are Fluid and Coordinated: Yes Fine Motor Movements are Fluid and Coordinated: Yes Motor  Motor Motor: Within Functional Limits  Mobility Bed Mobility Supine to Sit: 6: Modified independent (Device/Increase time);HOB flat;With rails Sitting - Scoot to Edge of Bed: 6: Modified independent (Device/Increase time);With rail Sit to Supine: 6: Modified independent (Device/Increase time);With rail;HOB flat Sit to Supine - Details (indicate cue type and reason): relies heavily on his towel looped under his right thigh to maneuver his leg into and out of the bed.   Transfers Sit to Stand: From bed;With upper extremity assist;From chair/3-in-1;6: Modified independent (Device/Increase time) Stand to Sit: With upper extremity assist;With armrests;To chair/3-in-1;To bed;6: Modified independent (Device/Increase time) Stand Pivot Transfers: 6: Modified independent (Device/Increase time);With armrests;From elevated surface Stand Pivot Transfer Details (indicate cue type and reason): pt moves very slowly, but is able to maintain TDWB on hsi right leg and moves safely with std W Locomotion  Ambulation Ambulation/Gait Assistance: 5: Supervision Ambulation/Gait Assistance Details: supervision for safety due to slow speed 5' Std Walker.   Gait Gait: Yes Gait Pattern: Impaired Gait Pattern: Decreased step length - right;Step-to pattern;Narrow base of support Gait velocity: significantly less than 1.8 ft/sec which puts him at risk for recurrent falls.   Stairs / Additional Locomotion Stairs: Yes Stairs Assistance: 4: Min assist Stairs Assistance Details (indicate cue type and reason): min assist to stabilize RW while he is going up the stairs Stair Management Technique: Step to pattern;Backwards;With walker Number of Stairs: 1  Height of Stairs: 7   Trunk/Postural Assessment  Cervical Assessment Cervical Assessment: Within  Functional Limits Thoracic Assessment Thoracic Assessment: Within Functional Limits Lumbar Assessment Lumbar Assessment: Within Functional Limits Postural Control Postural Control: Within Functional Limits  Balance   Extremity Assessment  RUE Assessment RUE Assessment: Within Functional Limits LUE Assessment LUE  Assessment: Within Functional Limits RLE Assessment RLE Assessment: Exceptions to [facility] RLE Strength RLE Overall Strength Comments: ankle 3+/5, knee 3-/5, hip 2-/5 per functional assessment LLE Assessment LLE Assessment: Within Functional Limits  See FIM for current functional status Skilled Interventions:  Session focused on stair practice up 7' step with std walker backwards with visual cues first to demonstrate technique and then verbal cues and min assist to stabilize RW while pt was preforming task.  Wife present and educated on where to guard pt and where to have son stand to help.  She verbalized understanding.  Car transfers with mod I with std Walker.  Pt slow to move, but able to get from [facility] 5' to car and manage his own legs in and out of car with education on how to make this transfer easier on him and his wife.  Bed mobility and transfers into and out of bed mod I using bed rail and towel under right leg to move leg into and out of bed. Pt moving slowly, but able to manage without external assist.    Lurena Joiner B. Marlo Arriola, PT, DPT [phone]   05/01/2012, 4:37 PM

## 2012-05-01 NOTE — Progress Notes (Signed)
[facility] Individual Statement of Services  Patient Name:  Justin Henson  Date:  05/01/2012  Welcome to the [facility].  Our goal is to provide you with an individualized program based on your diagnosis and situation, designed to meet your specific needs.  With this comprehensive rehabilitation program, you will be expected to participate in at least 3 hours of rehabilitation therapies Monday-Friday, with modified therapy programming on the weekends.  Your rehabilitation program will include the following services:  Physical Therapy (PT), Occupational Therapy (OT), 24 hour per day rehabilitation nursing, Therapeutic Recreaction (TR), Case Management ( Social Worker), Rehabilitation Medicine, Nutrition Services and Pharmacy Services  Weekly team conferences will be held on Tuesdays to discuss your progress.  Your  Social Worker will talk with you frequently to get your input and to update you on team discussions.  Team conferences with you and your family in attendance may also be held.  Expected length of stay: 2 weeks  Overall anticipated outcome: modified independent to                                                                                                                         supervision  Depending on your progress and recovery, your program may change.  Your  Social Worker will coordinate services and will keep you informed of any changes.  Your  Social Worker's name and contact numbers are listed  below.  The following services may also be recommended but are not provided by the [facility]:   Driving Evaluations  Home Health Rehabiltiation Services  Outpatient Rehabilitatation [facility]  Vocational Rehabilitation   Arrangements will be made to provide these services after discharge if needed.  Arrangements include referral to agencies that provide these services.  Your insurance has been verified to be:   [facility] Your primary doctor is:  Dr. Nicholos Johns  Pertinent information will be shared with your doctor and your insurance company.  Social Worker:  [place], [place] [phone] or ([phone]  Information discussed with and copy given to patient by: Amada Jupiter, 05/01/2012, 12:18 PM

## 2012-05-01 NOTE — Plan of Care (Signed)
Problem: RH PAIN MANAGEMENT Goal: RH STG PAIN MANAGED AT OR BELOW PT'S PAIN GOAL 4 or less  Outcome: Progressing With prn and scheduled pain medication

## 2012-05-01 NOTE — Progress Notes (Signed)
Occupational Therapy Discharge Summary  Patient Details  Name: Justin Henson MRN: [id number] Date of Birth: 09-26-1945  Today's Date: 05/01/2012  Patient has met 9 of 9 long term goals due to improved activity tolerance, improved balance, postural control, ability to compensate for deficits, functional use of  RIGHT lower extremity, improved attention, improved awareness and improved coordination.  Pt made steady progress this admission and is supervision for bathing, dressing, toilet transfers, shower transfers, and toileting.  Pt is independent with directing care and his wife has been present and participated in therapy sessions. Patient to discharge at overall Supervision level.  Patient's care partner is independent to provide the necessary supervision assistance at discharge.    Recommendation:  Patient will benefit from ongoing skilled OT services in home health setting to continue to advance functional skills in the area of BADL, iADL and Reduce care partner burden.  Equipment: BSC  Reasons for discharge: treatment goals met and discharge from hospital  Patient/family agrees with progress made and goals achieved: Yes  ADL ADL Equipment Provided: Reacher;Sock aid;Long-handled sponge;Long-handled shoe horn Eating: Independent Where Assessed-Eating: Bed level Grooming: Independent Where Assessed-Grooming: Sitting at sink Upper Body Bathing: Supervision/safety Where Assessed-Upper Body Bathing: Shower Lower Body Bathing: Supervision/safety Where Assessed-Lower Body Bathing: Shower Upper Body Dressing: Supervision/safety Where Assessed-Upper Body Dressing: Chair Lower Body Dressing: Supervision/safety Where Assessed-Lower Body Dressing: Edge of bed Toileting: Supervision/safety Where Assessed-Toileting: Bedside Commode Toilet Transfer: Close supervision Toilet Transfer Method: Stand pivot Acupuncturist: Psychiatric nurse: Close  supervision Film/video editor Method: Warden/ranger: Emergency planning/management officer  Vision/Perception  Vision - History Baseline Vision: Wears glasses all the time Patient Visual Report: No change from baseline Vision - Assessment Vision Assessment: Vision not tested Perception Perception: Within Functional Limits Praxis Praxis: Intact   Cognition Overall Cognitive Status: Appears within functional limits for tasks assessed Arousal/Alertness: Awake/alert Orientation Level: Oriented X4  Sensation Sensation Light Touch: Appears Intact Stereognosis: Appears Intact Hot/Cold: Appears Intact Proprioception: Appears Intact Coordination Gross Motor Movements are Fluid and Coordinated: Yes Fine Motor Movements are Fluid and Coordinated: Yes  Motor  Motor Motor: Within Functional Limits  Trunk/Postural Assessment  Cervical Assessment Cervical Assessment: Within Functional Limits Thoracic Assessment Thoracic Assessment: Within Functional Limits Lumbar Assessment Lumbar Assessment: Within Functional Limits Postural Control Postural Control: Within Functional Limits   Extremity/Trunk Assessment RUE Assessment RUE Assessment: Within Functional Limits LUE Assessment LUE Assessment: Within Functional Limits  See FIM for current functional status  Rich Brave 05/01/2012, 2:55 PM

## 2012-05-02 DIAGNOSIS — M84453A Pathological fracture, unspecified femur, initial encounter for fracture: Secondary | ICD-10-CM

## 2012-05-02 DIAGNOSIS — M84459D Pathological fracture, hip, unspecified, subsequent encounter for fracture with routine healing: Secondary | ICD-10-CM

## 2012-05-02 MED ORDER — SENNOSIDES-DOCUSATE SODIUM 8.6-50 MG PO TABS: 2.0000 | ORAL_TABLET | Freq: Two times a day (BID) | ORAL | Status: DC

## 2012-05-02 MED ORDER — ALPRAZOLAM 0.5 MG PO TABS: 0.5000 mg | ORAL_TABLET | Freq: Three times a day (TID) | ORAL | Status: DC | PRN

## 2012-05-02 MED ORDER — METHOCARBAMOL 500 MG PO TABS: 500.0000 mg | ORAL_TABLET | Freq: Four times a day (QID) | ORAL | Status: DC | PRN

## 2012-05-02 MED ORDER — OXYCODONE HCL 5 MG PO TABS: 5.0000 mg | ORAL_TABLET | ORAL | Status: DC | PRN

## 2012-05-02 MED ORDER — LORATADINE 10 MG PO TABS: 10.0000 mg | ORAL_TABLET | Freq: Every day | ORAL | Status: DC

## 2012-05-02 MED ORDER — MORPHINE SULFATE ER 30 MG PO TBCR: 30.0000 mg | EXTENDED_RELEASE_TABLET | Freq: Three times a day (TID) | ORAL | Status: DC

## 2012-05-02 NOTE — Progress Notes (Signed)
Patient ID: Justin Henson, male   DOB: [birth year], 67 y.o.   MRN: [id number]  Subjective/Complaints: Excited to go home. Pain much improved.  Review of Systems  Musculoskeletal: Positive for joint pain.       r hip with movement present still but better All other systems reviewed and are negative.   Objective: Vital Signs: Blood pressure 115/59, pulse 77, temperature 98.1 F (36.7 C), temperature source Oral, resp. rate 20, SpO2 98.00%. Ct Hip Right Wo Contrast  05/01/2012  *RADIOLOGY REPORT*  Clinical Data: Pathologic fracture of the right hip.  Severe pain.  CT OF THE RIGHT HIP WITHOUT CONTRAST  Technique:  Multidetector CT imaging was performed according to the standard protocol. Multiplanar CT image reconstructions were also generated.  Comparison: MRI dated 04/18/2012, CT scan dated 04/21/2012 and radiographs dated 01/07 and 01/09 and 04/29/2012  Findings: The intramedullary rod and compression screw appear in excellent position.  The tip of the screw is adjacent to the fovea but does not protrude beyond the contour of the femoral head. Joint spaces well preserved.  The small metastatic lesion in the right ischial tuberosity seen on MRI is not apparent on this CT scan.  There is destruction of the lesser trochanter with avulsion of the attachment of the iliopsoas tendon.  There is also partial avulsion of attachment quadratus femoris muscle.  IMPRESSION:  1.  The compression screw and intramedullary rod appear in excellent position in the right hip. 2.  Pathologic fracture and destruction of the lesser trochanter with at least partial avulsion of the attachments of the iliopsoas tendon and quadratus femoris muscle.   Original Report Authenticated By: Francene Boyers, M.D.    Results for orders placed during the hospital encounter of 04/26/12 (from the past 72 hour(s))  CANCER ANTIGEN 19-9     Status: Abnormal   Collection Time   04/30/12 10:52 AM      Component Value Range Comment   CA 19-9  47.1 (*) <35.0 U/mL      HEENT: normal Cardio: RRR Resp: CTA B/L GI: BS positive and Distention Extremity:  Edema R thigh Skin:   Wound C/D/I Neuro: Alert/Oriented, Anxious, Normal Sensory and Abnormal Motor pain inhibition R HF, KE, rated 1/5 Musc/Skel:  Extremity tender R hip, near iliopsoas, RF, worsened with palpation and HF.he is able to lift leg off bed today GEN NAD   Assessment/Plan: 1. 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 Physiatrist is providing close team supervision and 24 hour management of active medical problems listed below. Physiatrist and rehab team continue to assess barriers to discharge/monitor patient progress toward functional and medical goals.  i'll see him back in a month. Other follow up arranged.   FIM: FIM - Bathing Bathing Steps Patient Completed: Chest;Right Arm;Left Arm;Abdomen;Front perineal area;Buttocks;Right upper leg;Left upper leg;Right lower leg (including foot);Left lower leg (including foot) Bathing: 5: Supervision: Safety issues/verbal cues  FIM - Upper Body Dressing/Undressing Upper body dressing/undressing steps patient completed: Thread/unthread right sleeve of pullover shirt/dresss;Thread/unthread left sleeve of pullover shirt/dress;Put head through opening of pull over shirt/dress;Pull shirt over trunk Upper body dressing/undressing: 5: Supervision: Safety issues/verbal cues FIM - Lower Body Dressing/Undressing Lower body dressing/undressing steps patient completed: Thread/unthread right underwear leg;Thread/unthread left underwear leg;Pull pants up/down;Pull underwear up/down;Thread/unthread left pants leg;Thread/unthread right pants leg;Don/Doff left sock;Don/Doff right sock Lower body dressing/undressing: 5: Supervision: Safety issues/verbal  cues  FIM -  Toileting Toileting steps completed by patient: Adjust clothing prior to toileting;Performs perineal hygiene;Adjust clothing after toileting Toileting: 5: Supervision: Safety issues/verbal cues  FIM - Archivist Transfers Assistive Devices: Bedside commode;Elevated toilet seat Toilet Transfers: 5-To toilet/BSC: Supervision (verbal cues/safety issues);5-From toilet/BSC: Supervision (verbal cues/safety issues)  FIM - Banker Devices: Walker;Bed rails;Arm rests Bed/Chair Transfer: 6: Supine > Sit: No assist;6: Sit > Supine: No assist;5: Bed > Chair or W/C: Supervision (verbal cues/safety issues);5: Chair or W/C > Bed: Supervision (verbal cues/safety issues)  FIM - Locomotion: Wheelchair Distance: 15 Locomotion: Wheelchair: 2: Travels 50 - 149 ft with minimal assistance (Pt.>75%) FIM - Locomotion: Ambulation Locomotion: Ambulation Assistive Devices: Conservation officer, historic buildings Ambulation/Gait Assistance: 5: Supervision Locomotion: Ambulation: 1: Travels less than 50 ft with supervision/safety issues  Comprehension Comprehension Mode: Auditory Comprehension: 6-Follows complex conversation/direction: With extra time/assistive device  Expression Expression Mode: Verbal Expression: 6-Expresses complex ideas: With extra time/assistive device  Social Interaction Social Interaction: 6-Interacts appropriately with others with medication or extra time (anti-anxiety, antidepressant).  Problem Solving Problem Solving: 5-Solves complex 90% of the time/cues < 10% of the time  Memory Memory: 5-Recognizes or recalls 90% of the time/requires cueing < 10% of the time  Medical Problem List and Plan:  1. 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 DVT Prophylaxis/Anticoagulation: Subcutaneous Lovenox. Monitor platelet counts any signs of bleeding  3. Pain Management:  Hydrocodone, Robaxin as needed monitor with increased mobility.   -increased mscontin to 18mq8 hr to allow improve activity tolerance and this has helped  -will have to pace pt and move to tolerance. May not have much in the way of ambulation goals if pain doesn't improve  -CT performed. Screw appears to be in reasonable position and bone density fair.   keep TDWB for now. Discussed with the patient the fact that he shouldn't be over doing it for now.  4. Neuropsych: This patient is capable of making decisions on his/her own behalf.  5. Postoperative anemia. Continue iron supp.  6. Oncology. 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 Nausea: mostly due to constipation. Need to be aggressive with bowel program      LOS (Days) 6 A FACE TO FACE EVALUATION WAS PERFORMED  SWARTZ,ZACHARY T 05/02/2012, 6:53 AM

## 2012-05-02 NOTE — Progress Notes (Signed)
Pt discharged at 1000 with wife to home. Discharge instructions given by Harvel Ricks, PA with no further questions at this time. Belongings with family.

## 2012-05-02 NOTE — Progress Notes (Signed)
Social Work  Discharge Note  The overall goal for the admission was met for:   Discharge location: Yes - home with wife (wife taking FMLA)  Length of Stay: Yes - 6 days  Discharge activity level: Most goals met, however, some not met due to pain management issues  Home/community participation: No  Services provided included: MD, RD, PT, OT, RN, TR, Pharmacy and SW  Financial Services: Private Insurance: [facility]  Follow-up services arranged: Home Health: PT, OT via [facility], DME: 3n1 commode via [facility] and Patient/Family has no preference for HH/DME agencies  Comments (or additional information):  Patient/Family verbalized understanding of follow-up arrangements: Yes  Individual responsible for coordination of the follow-up plan: patient and wife  Confirmed correct DME delivered: Amada Jupiter 05/02/2012    Rudransh Bellanca

## 2012-05-05 ENCOUNTER — Encounter (HOSPITAL_COMMUNITY): Payer: Self-pay | Admitting: *Deleted

## 2012-05-05 ENCOUNTER — Emergency Department (HOSPITAL_COMMUNITY): Payer: 59

## 2012-05-05 ENCOUNTER — Observation Stay (HOSPITAL_COMMUNITY)
Admission: EM | Admit: 2012-05-05 | Discharge: 2012-05-06 | Disposition: A | Discharge status: Home / Self Care | Payer: 59 | Attending: Internal Medicine | Admitting: Internal Medicine

## 2012-05-05 DIAGNOSIS — D509 Iron deficiency anemia, unspecified: Secondary | ICD-10-CM | POA: Insufficient documentation

## 2012-05-05 DIAGNOSIS — I951 Orthostatic hypotension: Secondary | ICD-10-CM

## 2012-05-05 DIAGNOSIS — C7951 Secondary malignant neoplasm of bone: Secondary | ICD-10-CM | POA: Insufficient documentation

## 2012-05-05 DIAGNOSIS — R55 Syncope and collapse: Principal | ICD-10-CM

## 2012-05-05 DIAGNOSIS — R0602 Shortness of breath: Secondary | ICD-10-CM | POA: Insufficient documentation

## 2012-05-05 DIAGNOSIS — M84453A Pathological fracture, unspecified femur, initial encounter for fracture: Secondary | ICD-10-CM

## 2012-05-05 DIAGNOSIS — D126 Benign neoplasm of colon, unspecified: Secondary | ICD-10-CM | POA: Insufficient documentation

## 2012-05-05 DIAGNOSIS — Y92009 Unspecified place in unspecified non-institutional (private) residence as the place of occurrence of the external cause: Secondary | ICD-10-CM | POA: Insufficient documentation

## 2012-05-05 DIAGNOSIS — C801 Malignant (primary) neoplasm, unspecified: Secondary | ICD-10-CM

## 2012-05-05 DIAGNOSIS — M1990 Unspecified osteoarthritis, unspecified site: Secondary | ICD-10-CM

## 2012-05-05 DIAGNOSIS — M25559 Pain in unspecified hip: Secondary | ICD-10-CM | POA: Insufficient documentation

## 2012-05-05 DIAGNOSIS — W1811XA Fall from or off toilet without subsequent striking against object, initial encounter: Secondary | ICD-10-CM | POA: Insufficient documentation

## 2012-05-05 LAB — CBC WITH DIFFERENTIAL/PLATELET
Basophils Absolute: 0.1 10*3/uL (ref 0.0–0.1)
Basophils Relative: 1 % (ref 0–1)
Eosinophils Absolute: 0.1 10*3/uL (ref 0.0–0.7)
Eosinophils Relative: 1 % (ref 0–5)
HCT: 35.8 % — ABNORMAL LOW (ref 39.0–52.0)
MCH: 22.4 pg — ABNORMAL LOW (ref 26.0–34.0)
MCHC: 31.3 g/dL (ref 30.0–36.0)
MCV: 71.7 fL — ABNORMAL LOW (ref 78.0–100.0)
Monocytes Absolute: 0.7 10*3/uL (ref 0.1–1.0)
RDW: 19.1 % — ABNORMAL HIGH (ref 11.5–15.5)

## 2012-05-05 LAB — COMPREHENSIVE METABOLIC PANEL
AST: 19 U/L (ref 0–37)
Albumin: 3.5 g/dL (ref 3.5–5.2)
CO2: 26 mEq/L (ref 19–32)
Calcium: 9.3 mg/dL (ref 8.4–10.5)
Creatinine, Ser: 0.95 mg/dL (ref 0.50–1.35)
GFR calc non Af Amer: 85 mL/min — ABNORMAL LOW (ref >90)

## 2012-05-05 LAB — URINALYSIS, ROUTINE W REFLEX MICROSCOPIC
Ketones, ur: NEGATIVE mg/dL
Leukocytes, UA: NEGATIVE
Nitrite: NEGATIVE
pH: 8.5 — ABNORMAL HIGH (ref 5.0–8.0)

## 2012-05-05 LAB — URINE MICROSCOPIC-ADD ON

## 2012-05-05 LAB — CBC
Platelets: 386 10*3/uL (ref 150–400)
RDW: 19.1 % — ABNORMAL HIGH (ref 11.5–15.5)
WBC: 11.4 10*3/uL — ABNORMAL HIGH (ref 4.0–10.5)

## 2012-05-05 LAB — PROCALCITONIN: Procalcitonin: <0.1 ng/mL

## 2012-05-05 LAB — CREATININE, SERUM
GFR calc Af Amer: >90 mL/min (ref >90)
GFR calc non Af Amer: >90 mL/min (ref >90)

## 2012-05-05 MED ORDER — ACETAMINOPHEN 325 MG PO TABS: 650.0000 mg | ORAL_TABLET | Freq: Four times a day (QID) | ORAL | Status: DC | PRN

## 2012-05-05 MED ORDER — ONDANSETRON HCL 8 MG PO TABS
4.0000 mg | ORAL_TABLET | Freq: Four times a day (QID) | ORAL | Status: DC | PRN
  Filled 2012-05-05: qty 0.5

## 2012-05-05 MED ORDER — ACETAMINOPHEN 650 MG RE SUPP: 650.0000 mg | Freq: Four times a day (QID) | RECTAL | Status: DC | PRN

## 2012-05-05 MED ORDER — METHOCARBAMOL 500 MG PO TABS
500.0000 mg | ORAL_TABLET | Freq: Four times a day (QID) | ORAL | Status: DC | PRN
  Filled 2012-05-05: qty 1

## 2012-05-05 MED ORDER — SODIUM CHLORIDE 0.9 % IV SOLN
1000.0000 mL | Freq: Once | INTRAVENOUS | Status: AC
  Administered 2012-05-05: 1000 mL via INTRAVENOUS

## 2012-05-05 MED ORDER — SODIUM CHLORIDE 0.9 % IV SOLN
INTRAVENOUS | Status: DC
  Administered 2012-05-05 – 2012-05-06 (×3): via INTRAVENOUS

## 2012-05-05 MED ORDER — SODIUM CHLORIDE 0.9 % IV SOLN
1000.0000 mL | INTRAVENOUS | Status: DC
  Administered 2012-05-05: 1000 mL via INTRAVENOUS

## 2012-05-05 MED ORDER — SODIUM CHLORIDE 0.9 % IV SOLN: INTRAVENOUS | Status: DC

## 2012-05-05 MED ORDER — FENTANYL CITRATE 0.05 MG/ML IJ SOLN
50.0000 ug | Freq: Once | INTRAMUSCULAR | Status: AC
  Administered 2012-05-05: 50 ug via INTRAVENOUS
  Filled 2012-05-05: qty 2

## 2012-05-05 MED ORDER — SODIUM CHLORIDE 0.9 % IJ SOLN: 3.0000 mL | Freq: Two times a day (BID) | INTRAMUSCULAR | Status: DC

## 2012-05-05 MED ORDER — OXYCODONE HCL 5 MG PO TABS
5.0000 mg | ORAL_TABLET | ORAL | Status: DC | PRN
  Administered 2012-05-06: 5 mg via ORAL
  Filled 2012-05-05 (×2): qty 1

## 2012-05-05 MED ORDER — POLYETHYLENE GLYCOL 3350 17 G PO PACK
17.0000 g | PACK | Freq: Every day | ORAL | Status: DC | PRN
  Filled 2012-05-05 (×2): qty 1

## 2012-05-05 MED ORDER — ENOXAPARIN SODIUM 40 MG/0.4ML ~~LOC~~ SOLN
40.0000 mg | SUBCUTANEOUS | Status: DC
  Administered 2012-05-05 – 2012-05-06 (×2): 40 mg via SUBCUTANEOUS
  Filled 2012-05-05 (×2): qty 0.4

## 2012-05-05 MED ORDER — SENNOSIDES-DOCUSATE SODIUM 8.6-50 MG PO TABS
2.0000 | ORAL_TABLET | Freq: Two times a day (BID) | ORAL | Status: DC
  Administered 2012-05-05 – 2012-05-06 (×3): 2 via ORAL
  Filled 2012-05-05 (×5): qty 2

## 2012-05-05 MED ORDER — ONDANSETRON HCL 4 MG/2ML IJ SOLN: 4.0000 mg | Freq: Four times a day (QID) | INTRAMUSCULAR | Status: DC | PRN

## 2012-05-05 MED ORDER — MORPHINE SULFATE ER 30 MG PO TBCR
30.0000 mg | EXTENDED_RELEASE_TABLET | Freq: Three times a day (TID) | ORAL | Status: DC
  Administered 2012-05-05 – 2012-05-06 (×3): 30 mg via ORAL
  Filled 2012-05-05 (×3): qty 1

## 2012-05-05 NOTE — Progress Notes (Signed)
Utilization review completed.  P.J. Tiwana Chavis,RN,BSN Case Manager [phone]  

## 2012-05-05 NOTE — H&P (Signed)
[facility]          History and Physical    PCP:   Lolita Patella, MD   Chief Complaint:  Passed out   HPI: Pleasant 67 y/o white man just discharged from [facility] 3 days ago after rehab from a pathologic right hip fracture (path with metastatic ca, still of unknown primary), who woke up this am, got up to the bedside commode and as he was sitting down told his wife that he was going to pass out. Patient's next memory is of waking up in the ambulance. History is provided by wife Clydie Braun, who is at bedside. He did not fall from the chair or suffer any trauma, but his head fell back. His LOC lasted about 3 minutes. No jerking movements, no bowel/bladder incontinence, no tongue biting. He was confused when he woke up. When EMS was called his BP was in the 70s. We have been asked to admit him for further evaluation and management.  Allergies:  No Known Allergies    Past Medical History  Diagnosis Date  . Cancer 04/24/2012    lesions currently under diagnosis  . Shortness of breath     on exertion  . Arthritis     Past Surgical History  Procedure Date  . Femur im nail 04/22/2012    Procedure: INTRAMEDULLARY (IM) NAIL FEMORAL;  Surgeon: Harvie Junior, MD;  Location: [facility] OR;  Service: Orthopedics;  Laterality: Right;  PROPHYLACTIC  IM ROD RIGHT FEMUR   . Colonoscopy 04/25/2012    Procedure: COLONOSCOPY;  Surgeon: Petra Kuba, MD;  Location: [facility] ENDOSCOPY;  Service: Endoscopy;  Laterality: N/A;  . Esophagogastroduodenoscopy 04/30/2012    Procedure: ESOPHAGOGASTRODUODENOSCOPY (EGD);  Surgeon: Petra Kuba, MD;  Location: [facility] ENDOSCOPY;  Service: Endoscopy;  Laterality: N/A;    Prior to Admission medications   Medication Sig Start Date End Date Taking? Authorizing Provider  ALPRAZolam Prudy Feeler) 0.5 MG tablet Take 1 tablet (0.5 mg total) by mouth 3 (three) times daily as needed for anxiety. 05/02/12  Yes Daniel J Angiulli, PA  methocarbamol (ROBAXIN) 500 MG tablet Take 1 tablet  (500 mg total) by mouth every 6 (six) hours as needed. 05/02/12  Yes Daniel J Angiulli, PA  morphine (MS CONTIN) 30 MG 12 hr tablet Take 1 tablet (30 mg total) by mouth every 8 (eight) hours. 05/02/12  Yes Daniel J Angiulli, PA  oxyCODONE (OXY IR/ROXICODONE) 5 MG immediate release tablet Take 1-2 tablets (5-10 mg total) by mouth every 4 (four) hours as needed. 05/02/12  Yes Daniel J Angiulli, PA  senna-docusate (SENOKOT-S) 8.6-50 MG per tablet Take 2 tablets by mouth 2 (two) times daily. 05/02/12  Yes Charlton Amor, PA    Social History:  reports that he has never smoked. He has never used smokeless tobacco. He reports that he does not drink alcohol or use illicit drugs.  No family history on file.  Review of Systems:  Constitutional: Denies fever, chills, diaphoresis, appetite change. HEENT: Denies photophobia, eye pain, redness, hearing loss, ear pain, congestion, sore throat, rhinorrhea, sneezing, mouth sores, trouble swallowing, neck pain, neck stiffness and tinnitus.   Respiratory: Denies SOB, DOE, cough, chest tightness,  and wheezing.   Cardiovascular: Denies chest pain, palpitations and leg swelling.  Gastrointestinal: Denies nausea, vomiting, abdominal pain, diarrhea, constipation, blood in stool and abdominal distention.  Genitourinary: Denies dysuria, urgency, frequency, hematuria, flank pain and difficulty urinating.  Musculoskeletal: Denies myalgias, back pain, joint swelling, arthralgias and gait problem.  Skin: Denies  pallor, rash and wound.  Neurological: Denies dizziness, seizures, syncope, weakness, light-headedness, numbness and headaches.  Hematological: Denies adenopathy. Easy bruising, personal or family bleeding history  Psychiatric/Behavioral: Denies suicidal ideation, mood changes, confusion, nervousness, sleep disturbance and agitation   Physical Exam: Blood pressure 109/68, pulse 80, temperature 98.5 F (36.9 C), temperature source Rectal, resp. rate 18, SpO2  97.00%. Gen: AA Ox3 HEENT: [place]/AT/PERRL/EOMI/dry mucous membranes. Neck: supple, no JVD, no LAD, no bruits, no goiter. CV: RRR, no M/R/G Lungs: CTA B Abd: S/NT/ND/+BS/no masses or organomegaly noted. Ext: 1+ edema bilaterally. Neuro: non-focal. I have not ambulated him.  Labs on Admission:  Results for orders placed during the hospital encounter of 05/05/12 (from the past 48 hour(s))  CBC WITH DIFFERENTIAL     Status: Abnormal   Collection Time   05/05/12  8:26 AM      Component Value Range Comment   WBC 11.0 (*) 4.0 - 10.5 K/uL    RBC 4.99  4.22 - 5.81 MIL/uL    Hemoglobin 11.2 (*) 13.0 - 17.0 g/dL    HCT 69.6 (*) 29.5 - 52.0 %    MCV 71.7 (*) 78.0 - 100.0 fL    MCH 22.4 (*) 26.0 - 34.0 pg    MCHC 31.3  30.0 - 36.0 g/dL    RDW 28.4 (*) 13.2 - 15.5 %    Platelets 372  150 - 400 K/uL    Neutrophils Relative 86 (*) 43 - 77 %    Neutro Abs 9.4 (*) 1.7 - 7.7 K/uL    Lymphocytes Relative 7 (*) 12 - 46 %    Lymphs Abs 0.7  0.7 - 4.0 K/uL    Monocytes Relative 6  3 - 12 %    Monocytes Absolute 0.7  0.1 - 1.0 K/uL    Eosinophils Relative 1  0 - 5 %    Eosinophils Absolute 0.1  0.0 - 0.7 K/uL    Basophils Relative 1  0 - 1 %    Basophils Absolute 0.1  0.0 - 0.1 K/uL   COMPREHENSIVE METABOLIC PANEL     Status: Abnormal   Collection Time   05/05/12  8:26 AM      Component Value Range Comment   Sodium 137  135 - 145 mEq/L    Potassium 4.5  3.5 - 5.1 mEq/L    Chloride 100  96 - 112 mEq/L    CO2 26  19 - 32 mEq/L    Glucose, Bld 101 (*) 70 - 99 mg/dL    BUN 9  6 - 23 mg/dL    Creatinine, Ser 4.40  0.50 - 1.35 mg/dL    Calcium 9.3  8.4 - 10.2 mg/dL    Total Protein 6.4  6.0 - 8.3 g/dL    Albumin 3.5  3.5 - 5.2 g/dL    AST 19  0 - 37 U/L    ALT 12  0 - 53 U/L    Alkaline Phosphatase 119 (*) 39 - 117 U/L    Total Bilirubin 0.6  0.3 - 1.2 mg/dL    GFR calc non Af Amer 85 (*) >90 mL/min    GFR calc Af Amer >90  >90 mL/min   LACTIC ACID, PLASMA     Status: Normal   Collection Time    05/05/12  8:27 AM      Component Value Range Comment   Lactic Acid, Venous 1.6  0.5 - 2.2 mmol/L   PROCALCITONIN     Status: Normal  Collection Time   05/05/12  8:28 AM      Component Value Range Comment   Procalcitonin <0.10     URINALYSIS, ROUTINE W REFLEX MICROSCOPIC     Status: Abnormal   Collection Time   05/05/12 10:03 AM      Component Value Range Comment   Color, Urine YELLOW  YELLOW    APPearance CLEAR  CLEAR    Specific Gravity, Urine 1.018  1.005 - 1.030    pH 8.5 (*) 5.0 - 8.0    Glucose, UA NEGATIVE  NEGATIVE mg/dL    Hgb urine dipstick NEGATIVE  NEGATIVE    Bilirubin Urine NEGATIVE  NEGATIVE    Ketones, ur NEGATIVE  NEGATIVE mg/dL    Protein, ur 30 (*) NEGATIVE mg/dL    Urobilinogen, UA 1.0  0.0 - 1.0 mg/dL    Nitrite NEGATIVE  NEGATIVE    Leukocytes, UA NEGATIVE  NEGATIVE   URINE MICROSCOPIC-ADD ON     Status: Abnormal   Collection Time   05/05/12 10:03 AM      Component Value Range Comment   WBC, UA 0-2  <3 WBC/hpf    RBC / HPF 0-2  <3 RBC/hpf    Bacteria, UA RARE  RARE    Casts GRANULAR CAST (*) NEGATIVE    Urine-Other MUCOUS PRESENT       Radiological Exams on Admission: Ct Head Wo Contrast  05/05/2012  *RADIOLOGY REPORT*  Clinical Data: Confusion, altered mental status, history osseous metastatic disease versus multiple myeloma by prior imaging  CT HEAD WITHOUT CONTRAST  Technique:  Contiguous axial images were obtained from the base of the skull through the vertex without contrast.  Comparison: None  Findings: Few motion artifacts, for which repeat imaging was performed. Mild age-related atrophy. Normal ventricular morphology. No midline shift or mass effect. Otherwise normal appearance brain parenchyma. No intracranial hemorrhage, mass lesion, evidence of acute infarction or extra-axial fluid collection. Bones sinuses unremarkable.  IMPRESSION: No acute intracranial abnormalities.   Original Report Authenticated By: Ulyses Southward, M.D.    Dg Chest Port 1  View  05/05/2012  *RADIOLOGY REPORT*  Clinical Data: Hypotension and confusion.  Recent hip replacement.  PORTABLE CHEST - 1 VIEW  Comparison: CT chest and chest radiograph 04/21/2012.  Findings: Trachea is midline.  Heart size normal.  Subsegmental atelectasis in both perihilar regions.  Lungs are low in volume but otherwise clear.  No pleural fluid.  IMPRESSION: Minimal subsegmental atelectasis in the perihilar regions.   Original Report Authenticated By: Leanna Battles, M.D.     Assessment/Plan Principal Problem:  *Syncope Active Problems:  Pathologic fracture of femur  Orthostatic hypotension  Cancer with unknown primary site   Syncope -With his history and low BP, suspect this was orthostatic in nature. -Could have been vasovagal as well as he was urinating. -Doubt arrythmia. -Admit to tele, ECHO/Dopplers, IVF, check orthostatic VS. -Has already received almost 2 L of IVF in the ED.  Pathologic Right Femur Fracture -s/p repair. -intraop biopsies c/w metastaic carcinoma. -Also had a colonic mass, but biopsies c/w tubular adenoma. -Follows up with Dr. Myrle Sheng next week to decide further treatment plan. -PT/OT evals.  DVT Prophylaxis -Lovenox.  Code Status -Full.   Time Spent on Admission: 75 minutes  HERNANDEZ ACOSTA,ESTELA [facility] Pager: [phone] 05/05/2012, 12:53 PM

## 2012-05-05 NOTE — ED Notes (Signed)
Patient with altered mental status this am, patient having bowel movement this am and started screaming at family and altered per family, patient with intermittant episodes of ams per family and EMS, FSBS 147 per EMS, patient with recent surgery to right femur/hip

## 2012-05-05 NOTE — ED Provider Notes (Signed)
History     CSN: [id number]  Arrival date & time 05/05/12  0704   First MD Initiated Contact with Patient 05/05/12 [phone]      Chief Complaint  Patient presents with  . Altered Mental Status    (Consider location/radiation/quality/duration/timing/severity/associated sxs/prior treatment) HPI Comments: Patient was brought in by EMS for altered mental status. 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 He states she's had ongoing pain to this area but it's not worse than it has been. He was in the bathroom trying to have a bowel movement this morning and per report, fell off the toilet and had a brief syncopal episode. When he woke up he was combative and yelling in the family. He was also confused. EMS noted him to be hypotensive with a blood pressure initially in the 70s and then subsequently in the 90s. He hasn't noted any increased pain or drainage from the wound. He has not had any headaches or neck pain. He hasn't had a known fevers at home although is complaining of chills currently. He denies any cough or chest congestion. He denies any abdominal pain. He denies any vomiting or diarrhea. He denies any numbness or weakness in his extremities other than feeling generally weak all over. EMS did not note any seizure activity.  Per pt's wife, he got out of bed to sit on potty chair and while there, felt like he was going to pass out.  He did not fall out of chair, but had brief syncope, then confused.  Has had one prior episode, but pt's confusion lasted much longer today.  Pt is oriented now, but still says that he doesn't feel well.  Patient is a 67 y.o. male presenting with altered mental status.  Altered Mental Status Pertinent negatives include no chest pain, no abdominal pain, no headaches and no shortness of breath.    Past Medical History  Diagnosis Date  . Cancer 04/24/2012    lesions currently under diagnosis  . Shortness of breath    on exertion  . Arthritis     Past Surgical History  Procedure Date  . Femur im nail 04/22/2012    Procedure: INTRAMEDULLARY (IM) NAIL FEMORAL;  Surgeon: Harvie Junior, MD;  Location: [facility] OR;  Service: Orthopedics;  Laterality: Right;  PROPHYLACTIC  IM ROD RIGHT FEMUR   . Colonoscopy 04/25/2012    Procedure: COLONOSCOPY;  Surgeon: Petra Kuba, MD;  Location: [facility] ENDOSCOPY;  Service: Endoscopy;  Laterality: N/A;  . Esophagogastroduodenoscopy 04/30/2012    Procedure: ESOPHAGOGASTRODUODENOSCOPY (EGD);  Surgeon: Petra Kuba, MD;  Location: [facility] ENDOSCOPY;  Service: Endoscopy;  Laterality: N/A;    No family history on file.  History  Substance Use Topics  . Smoking status: Never Smoker   . Smokeless tobacco: Never Used  . Alcohol Use: No      Review of Systems  Constitutional: Positive for chills and fatigue. Negative for fever and diaphoresis.  HENT: Negative for congestion, rhinorrhea and sneezing.   Eyes: Negative.   Respiratory: Negative for cough, chest tightness and shortness of breath.   Cardiovascular: Negative for chest pain and leg swelling.  Gastrointestinal: Negative for nausea, vomiting, abdominal pain, diarrhea and blood in stool.  Genitourinary: Negative for frequency, hematuria, flank pain and difficulty urinating.  Musculoskeletal: Negative for back pain and arthralgias.       Right hip pain  Skin: Negative for rash.  Neurological: Negative for dizziness, speech  difficulty, weakness, numbness and headaches.  Psychiatric/Behavioral: Positive for confusion and altered mental status.    Allergies  Review of patient's allergies indicates no known allergies.  Home Medications   Current Outpatient Rx  Name  Route  Sig  Dispense  Refill  . ALPRAZOLAM 0.5 MG PO TABS   Oral   Take 1 tablet (0.5 mg total) by mouth 3 (three) times daily as needed for anxiety.   30 tablet   0   . METHOCARBAMOL 500 MG PO TABS   Oral   Take 1 tablet (500 mg total) by mouth every 6 (six)  hours as needed.   60 tablet   0   . MORPHINE SULFATE ER 30 MG PO TBCR   Oral   Take 1 tablet (30 mg total) by mouth every 8 (eight) hours.   60 tablet   0   . OXYCODONE HCL 5 MG PO TABS   Oral   Take 1-2 tablets (5-10 mg total) by mouth every 4 (four) hours as needed.   90 tablet   0   . SENNOSIDES-DOCUSATE SODIUM 8.6-50 MG PO TABS   Oral   Take 2 tablets by mouth 2 (two) times daily.           BP 108/69  Pulse 80  Temp 98.5 F (36.9 C) (Rectal)  Resp 18  SpO2 97%  Physical Exam  Constitutional: He is oriented to person, place, and time. He appears well-developed and well-nourished.  HENT:  Head: Normocephalic and atraumatic.       Dry mucus membranes  Eyes: Pupils are equal, round, and reactive to light.  Neck: Normal range of motion. Neck supple.  Cardiovascular: Normal rate, regular rhythm and normal heart sounds.   Pulmonary/Chest: Effort normal and breath sounds normal. No respiratory distress. He has no wheezes. He has no rales. He exhibits no tenderness.  Abdominal: Soft. Bowel sounds are normal. There is no tenderness. There is no rebound and no guarding.  Musculoskeletal: Normal range of motion. He exhibits no edema.       +ecchymosis and mild swelling around right lateral hip.  +TTP.  No redness/warmth, drainage from site  Lymphadenopathy:    He has no cervical adenopathy.  Neurological: He is alert and oriented to person, place, and time.       Generalized weakness, has symmetric strength to hands/feet.  No facial droop.  Skin: Skin is warm and dry. No rash noted.  Psychiatric: He has a normal mood and affect.    ED Course  Procedures (including critical care time)  Results for orders placed during the hospital encounter of 05/05/12  CBC WITH DIFFERENTIAL      Component Value Range   WBC 11.0 (*) 4.0 - 10.5 K/uL   RBC 4.99  4.22 - 5.81 MIL/uL   Hemoglobin 11.2 (*) 13.0 - 17.0 g/dL   HCT 09.8 (*) 11.9 - 14.7 %   MCV 71.7 (*) 78.0 - 100.0 fL    MCH 22.4 (*) 26.0 - 34.0 pg   MCHC 31.3  30.0 - 36.0 g/dL   RDW 82.9 (*) 56.2 - 13.0 %   Platelets 372  150 - 400 K/uL   Neutrophils Relative 86 (*) 43 - 77 %   Neutro Abs 9.4 (*) 1.7 - 7.7 K/uL   Lymphocytes Relative 7 (*) 12 - 46 %   Lymphs Abs 0.7  0.7 - 4.0 K/uL   Monocytes Relative 6  3 - 12 %   Monocytes Absolute 0.7  0.1 - 1.0 K/uL   Eosinophils Relative 1  0 - 5 %   Eosinophils Absolute 0.1  0.0 - 0.7 K/uL   Basophils Relative 1  0 - 1 %   Basophils Absolute 0.1  0.0 - 0.1 K/uL  COMPREHENSIVE METABOLIC PANEL      Component Value Range   Sodium 137  135 - 145 mEq/L   Potassium 4.5  3.5 - 5.1 mEq/L   Chloride 100  96 - 112 mEq/L   CO2 26  19 - 32 mEq/L   Glucose, Bld 101 (*) 70 - 99 mg/dL   BUN 9  6 - 23 mg/dL   Creatinine, Ser 1.19  0.50 - 1.35 mg/dL   Calcium 9.3  8.4 - 14.7 mg/dL   Total Protein 6.4  6.0 - 8.3 g/dL   Albumin 3.5  3.5 - 5.2 g/dL   AST 19  0 - 37 U/L   ALT 12  0 - 53 U/L   Alkaline Phosphatase 119 (*) 39 - 117 U/L   Total Bilirubin 0.6  0.3 - 1.2 mg/dL   GFR calc non Af Amer 85 (*) >90 mL/min   GFR calc Af Amer >90  >90 mL/min  LACTIC ACID, PLASMA      Component Value Range   Lactic Acid, Venous 1.6  0.5 - 2.2 mmol/L  PROCALCITONIN      Component Value Range   Procalcitonin <0.10    URINALYSIS, ROUTINE W REFLEX MICROSCOPIC      Component Value Range   Color, Urine YELLOW  YELLOW   APPearance CLEAR  CLEAR   Specific Gravity, Urine 1.018  1.005 - 1.030   pH 8.5 (*) 5.0 - 8.0   Glucose, UA NEGATIVE  NEGATIVE mg/dL   Hgb urine dipstick NEGATIVE  NEGATIVE   Bilirubin Urine NEGATIVE  NEGATIVE   Ketones, ur NEGATIVE  NEGATIVE mg/dL   Protein, ur 30 (*) NEGATIVE mg/dL   Urobilinogen, UA 1.0  0.0 - 1.0 mg/dL   Nitrite NEGATIVE  NEGATIVE   Leukocytes, UA NEGATIVE  NEGATIVE  URINE MICROSCOPIC-ADD ON      Component Value Range   WBC, UA 0-2  <3 WBC/hpf   RBC / HPF 0-2  <3 RBC/hpf   Bacteria, UA RARE  RARE   Casts GRANULAR CAST (*) NEGATIVE    Urine-Other MUCOUS PRESENT      Ct Head Wo Contrast  05/05/2012  *RADIOLOGY REPORT*  Clinical Data: Confusion, altered mental status, history osseous metastatic disease versus multiple myeloma by prior imaging  CT HEAD WITHOUT CONTRAST  Technique:  Contiguous axial images were obtained from the base of the skull through the vertex without contrast.  Comparison: None  Findings: Few motion artifacts, for which repeat imaging was performed. Mild age-related atrophy. Normal ventricular morphology. No midline shift or mass effect. Otherwise normal appearance brain parenchyma. No intracranial hemorrhage, mass lesion, evidence of acute infarction or extra-axial fluid collection. Bones sinuses unremarkable.  IMPRESSION: No acute intracranial abnormalities.   Original Report Authenticated By: Ulyses Southward, M.D.    Ct Chest W Contrast  04/21/2012  *RADIOLOGY REPORT*  Clinical Data:  Metastatic survey  CT CHEST, ABDOMEN AND PELVIS WITH CONTRAST  Technique:  Multidetector CT imaging of the chest, abdomen and pelvis was performed following the standard protocol during bolus administration of intravenous contrast.  Contrast: OMNIPAQUE IOHEXOL 300 MG/ML  SOLN  Comparison:  04/18/2012  CT CHEST  Findings:  Lungs/pleura: Nonspecific subpleural nodule in the right upper lobe measures 5  mm, image 41. Parenchymal nodule in the right upper lobe measures 5 mm, image 33.  Left lower lobe subpleural nodule measures 5 mm, image 42.  Dependent changes are noted in both lung bases posteriorly.  Heart/Mediastinum: Normal heart size.  No pericardial effusion.  No mediastinal or hilar adenopathy.  Bones/Musculoskeletal:  Small lytic lesion is identified within the manubrium.  This measures approximately 1 cm, image 21/series 3. Lucent lesion within the T9 vertebra measures 10 mm, which is favored to represent a benign vertebral hemangioma.  IMPRESSION:  1.  Small nonspecific pulmonary nodules measure up to 5 mm. 2.  Focal lytic lesion  is identified within the manubrium.  CT ABDOMEN AND PELVIS  Findings:  Mild diffuse low attenuation within the liver parenchyma identified.  No this suspicious liver abnormality noted.  1.1 cm stone noted within the gallbladder.  No biliary dilatation.  The pancreas appears normal.  Normal appearance of the spleen.  The adrenal glands are both normal.  Normal appearance of the kidneys.  The urinary bladder appears within normal limits. Prostate gland and seminal vesicles are unremarkable.  No enlarged lymph nodes within the upper abdomen.  The abdominal aorta has a normal caliber.  No pelvic or inguinal adenopathy identified.  No free fluid or fluid collections within the abdomen or pelvis. The stomach appears normal.  The small bowel loops have a normal course and caliber.  Normal appearance of the appendix.  Large mass within the proximal transverse colon is identified.  This measures 7.4 x 4.4 x 4.8 cm.  No obstruction.  Review of the visualized osseous structures again shows a expansile lytic lesion involving the proximal right femur at the level of the lesser trochanter.  This measures approximately 4.8 x 4.3 x 5.5 cm. No additional suspicious bone lesions identified.  IMPRESSION:  1.  Mass within the proximal transverse colon is concerning for primary colonic neoplasm.  Correlation with colonoscopy is suggested. 2.  Expansile lytic lesion involving the proximal right femur is identified. This is favored to represent osseous metastasis. Primary bone neoplasm is not excluded but is considered less favored. 3.  Hepatic steatosis. 4.  Gallstone.   Original Report Authenticated By: Signa Kell, M.D.    Ct Abdomen Pelvis W Contrast  04/21/2012  *RADIOLOGY REPORT*  Clinical Data:  Metastatic survey  CT CHEST, ABDOMEN AND PELVIS WITH CONTRAST  Technique:  Multidetector CT imaging of the chest, abdomen and pelvis was performed following the standard protocol during bolus administration of intravenous contrast.   Contrast: OMNIPAQUE IOHEXOL 300 MG/ML  SOLN  Comparison:  04/18/2012  CT CHEST  Findings:  Lungs/pleura: Nonspecific subpleural nodule in the right upper lobe measures 5 mm, image 41. Parenchymal nodule in the right upper lobe measures 5 mm, image 33.  Left lower lobe subpleural nodule measures 5 mm, image 42.  Dependent changes are noted in both lung bases posteriorly.  Heart/Mediastinum: Normal heart size.  No pericardial effusion.  No mediastinal or hilar adenopathy.  Bones/Musculoskeletal:  Small lytic lesion is identified within the manubrium.  This measures approximately 1 cm, image 21/series 3. Lucent lesion within the T9 vertebra measures 10 mm, which is favored to represent a benign vertebral hemangioma.  IMPRESSION:  1.  Small nonspecific pulmonary nodules measure up to 5 mm. 2.  Focal lytic lesion is identified within the manubrium.  CT ABDOMEN AND PELVIS  Findings:  Mild diffuse low attenuation within the liver parenchyma identified.  No this suspicious liver abnormality noted.  1.1 cm  stone noted within the gallbladder.  No biliary dilatation.  The pancreas appears normal.  Normal appearance of the spleen.  The adrenal glands are both normal.  Normal appearance of the kidneys.  The urinary bladder appears within normal limits. Prostate gland and seminal vesicles are unremarkable.  No enlarged lymph nodes within the upper abdomen.  The abdominal aorta has a normal caliber.  No pelvic or inguinal adenopathy identified.  No free fluid or fluid collections within the abdomen or pelvis. The stomach appears normal.  The small bowel loops have a normal course and caliber.  Normal appearance of the appendix.  Large mass within the proximal transverse colon is identified.  This measures 7.4 x 4.4 x 4.8 cm.  No obstruction.  Review of the visualized osseous structures again shows a expansile lytic lesion involving the proximal right femur at the level of the lesser trochanter.  This measures approximately  4.8 x 4.3 x 5.5 cm. No additional suspicious bone lesions identified.  IMPRESSION:  1.  Mass within the proximal transverse colon is concerning for primary colonic neoplasm.  Correlation with colonoscopy is suggested. 2.  Expansile lytic lesion involving the proximal right femur is identified. This is favored to represent osseous metastasis. Primary bone neoplasm is not excluded but is considered less favored. 3.  Hepatic steatosis. 4.  Gallstone.   Original Report Authenticated By: Signa Kell, M.D.    Ct Hip Right Wo Contrast  05/01/2012  *RADIOLOGY REPORT*  Clinical Data: Pathologic fracture of the right hip.  Severe pain.  CT OF THE RIGHT HIP WITHOUT CONTRAST  Technique:  Multidetector CT imaging was performed according to the standard protocol. Multiplanar CT image reconstructions were also generated.  Comparison: MRI dated 04/18/2012, CT scan dated 04/21/2012 and radiographs dated 01/07 and 01/09 and 04/29/2012  Findings: The intramedullary rod and compression screw appear in excellent position.  The tip of the screw is adjacent to the fovea but does not protrude beyond the contour of the femoral head. Joint spaces well preserved.  The small metastatic lesion in the right ischial tuberosity seen on MRI is not apparent on this CT scan.  There is destruction of the lesser trochanter with avulsion of the attachment of the iliopsoas tendon.  There is also partial avulsion of attachment quadratus femoris muscle.  IMPRESSION:  1.  The compression screw and intramedullary rod appear in excellent position in the right hip. 2.  Pathologic fracture and destruction of the lesser trochanter with at least partial avulsion of the attachments of the iliopsoas tendon and quadratus femoris muscle.   Original Report Authenticated By: Francene Boyers, M.D.    Dg Chest Port 1 View  05/05/2012  *RADIOLOGY REPORT*  Clinical Data: Hypotension and confusion.  Recent hip replacement.  PORTABLE CHEST - 1 VIEW  Comparison: CT  chest and chest radiograph 04/21/2012.  Findings: Trachea is midline.  Heart size normal.  Subsegmental atelectasis in both perihilar regions.  Lungs are low in volume but otherwise clear.  No pleural fluid.  IMPRESSION: Minimal subsegmental atelectasis in the perihilar regions.   Original Report Authenticated By: Leanna Battles, M.D.       1. Syncope       MDM  Patient is feeling better but he still very sleepy and says he doesn't feel good. His wife says these typically more alert. I don't find any focal neurologic deficits. I don't see any signs of infection or sepsis. I will go ahead and admit him to the hospital for further evaluation.  I discussed this with the hospitalist who has agreed and the patient.        Rolan Bucco, MD 05/05/12 [phone]

## 2012-05-06 ENCOUNTER — Encounter: Payer: Self-pay | Admitting: Radiation Oncology

## 2012-05-06 LAB — CBC
MCV: 71.9 fL — ABNORMAL LOW (ref 78.0–100.0)
Platelets: 390 10*3/uL (ref 150–400)
RDW: 19.5 % — ABNORMAL HIGH (ref 11.5–15.5)
WBC: 8.3 10*3/uL (ref 4.0–10.5)

## 2012-05-06 LAB — BASIC METABOLIC PANEL
Calcium: 9.2 mg/dL (ref 8.4–10.5)
Creatinine, Ser: 0.93 mg/dL (ref 0.50–1.35)
GFR calc Af Amer: >90 mL/min (ref >90)

## 2012-05-06 LAB — URINE CULTURE

## 2012-05-06 NOTE — Progress Notes (Addendum)
Discussed discharge instructions with pt. Pt showed no barriers to discharge. IV unchanged from morning. IV removed. Tele removed. Pt discharged to home with wife. Instructed pt and his wife on how to put on TED hose, size XL given to pt at discharge. Pt declined application of TED hose prior to discharge.

## 2012-05-06 NOTE — Progress Notes (Signed)
OT Cancellation Note  Patient Details Name: Justin Henson MRN: [id number] DOB: [birth year]   Cancelled Treatment:    Reason Eval/Treat Not Completed: Fatigue/lethargy limiting ability to participate. Pt reporting "I just don't think I can do therapy today."  Will re-attempt at later date.  05/06/2012 Cipriano Mile OTR/L Pager [phone] Office [phone]

## 2012-05-06 NOTE — Discharge Summary (Signed)
Physician Discharge Summary  MAKENZIE VITTORIO QIO:[id number] DOB: 30-Sep-1945 DOA: 05/05/2012  PCP: Lolita Patella, MD  Admit date: 05/05/2012 Discharge date: 05/06/2012  Time spent: 35 minutes  Recommendations for Outpatient Follow-up:  Follow up CBC and orthostatics  Discharge Diagnoses:  Syncope - no recurrence in hospital  Pathologic fracture of femur status post open reduction internal fixation  Orthostatic hypotension  Cancer with unknown primary site Microcytic anemia Colonic  Adenoma  Discharge Condition: Good  Diet recommendation: Regular  Filed Weights   05/05/12 2052  Weight: 107.502 kg (237 lb)    History of present illness:  Pleasant 67 y/o white man just discharged from [facility] 3 days ago after rehab from a pathologic right hip fracture (path with metastatic ca, still of unknown primary), who woke up this am, got up to the bedside commode and as he was sitting down told his wife that he was going to pass out. Patient's next memory is of waking up in the ambulance. History is provided by wife Clydie Braun, who is at bedside. He did not fall from the chair or suffer any trauma, but his head fell back. His LOC lasted about 3 minutes. No jerking movements, no bowel/bladder incontinence, no tongue biting. He was confused when he woke up. When EMS was called his BP was in the 70s. We have been asked to admit him for further evaluation and management.      Hospital Course:  1. Syncope  -With his history and low BP, suspect this was orthostatic in nature.  -Could have been vasovagal as well as he was urinating.  -Patient was observed overnight and no arrhythmia was noted on the monitor - An echocardiogram was obtained and it showed a normal ejection fraction -The patient received IV fluids and orthostasis resolved prior to discharge - We recommended compression stockings bilaterally and to change position very slowly - Patient did start Florinef if he continues to be  orthostatic Pathologic Right Femur Fracture  -s/p repair.  -intraop biopsies c/w metastaic carcinoma.  -Also had a colonic mass, but biopsies c/w tubular adenoma.  -Follows up with Dr. Myrle Sheng this week to decide further treatment plan.    Mild microcytic anemia No active bleeding Could consider transfusion if patient remains symptomatic with orthostasis   Procedures: Echocardiogram Consultations:  None  Discharge Exam: Filed Vitals:   05/06/12 1354 05/06/12 1356 05/06/12 1400 05/06/12 1405  BP: 119/65 116/56 103/56 103/61  Pulse: 73 107 116 125  Temp: 98.5 F (36.9 C)     TempSrc:      Resp: 18     Height:      Weight:      SpO2: 96%       General: Alert and oriented x3 Cardiovascular: Regular rate and rhythm without murmur subscales Respiratory: Clear to auscultation bilaterally  Discharge Instructions  Discharge Orders    Future Appointments: Provider: Department: Dept Phone: Center:   05/09/2012 10:30 AM [facility] [phone] None   05/09/2012 11:00 AM Ladene Artist, MD [facility] MED[facility] [phone] None   05/20/2012 10:00 AM Ranelle Oyster, MD [facility] [phone] CPR     Future Orders Please Complete By Expires   Diet general      Increase activity slowly          Medication List     As of 05/06/2012  3:12 PM    TAKE these medications  ALPRAZolam 0.5 MG tablet   Commonly known as: XANAX   Take 1 tablet (0.5 mg total) by mouth 3 (three) times daily as needed for anxiety.      methocarbamol 500 MG tablet   Commonly known as: ROBAXIN   Take 1 tablet (500 mg total) by mouth every 6 (six) hours as needed.      morphine 30 MG 12 hr tablet   Commonly known as: MS CONTIN   Take 1 tablet (30 mg total) by mouth every 8 (eight) hours.      oxyCODONE 5 MG immediate release tablet   Commonly known as: Oxy  IR/ROXICODONE   Take 1-2 tablets (5-10 mg total) by mouth every 4 (four) hours as needed.      senna-docusate 8.6-50 MG per tablet   Commonly known as: Senokot-S   Take 2 tablets by mouth 2 (two) times daily.           Follow-up Information    Follow up with Lolita Patella, MD.   Contact information:   [facility] [address] [place] [address] [phone]           The results of significant diagnostics from this hospitalization (including imaging, microbiology, ancillary and laboratory) are listed below for reference.    Significant Diagnostic Studies: Dg Hip Complete Right  04/24/2012  *RADIOLOGY REPORT*  Clinical Data: Right hip and femoral pain post ORIF  RIGHT HIP - COMPLETE 2+ VIEW  Comparison: 04/22/2012 Correlation:  MRI right hip 04/18/2012  Findings: Diffuse osseous demineralization. IM nail with compression screw identified at proximal right femur. A large area bone destruction is identified at the medial aspect of the proximal femur including lesser trochanteric region, corresponding to mass identified on prior MR. Fracture of the proximal right femur is identified through the destructive lesion, unchanged since intraoperative images. No dislocation identified. Visualized pelvis intact. Pelvic phleboliths noted.  IMPRESSION: Orthopedic hardware proximal right femur across a large destructive lesion at the medial aspect of proximal right femur. Fracture identified inferior to the greater trochanter extending through the destructive lesion, unchanged since the intraoperative images of 04/22/2012.a   Original Report Authenticated By: Ulyses Southward, M.D.    Dg Femur Right  04/29/2012  *RADIOLOGY REPORT*  Clinical Data: Postop pathologic fracture fixation with persistent pain.  RIGHT FEMUR - 2 VIEW  Comparison:  Findings: The intramedullary rod and dynamic hip screw are stable in position.  There appears to be mild lucency around the  screw threads.  This may be mechanical and due to extensive tumor and unstable fracture.  CT may be helpful for further evaluation. Findings discussed with Dr. Luiz Blare.  IMPRESSION:  1.  No obvious change in position of the fixating hardware. 2.  Suspect lucency around the threads of the dynamic hip screw which could suggest mechanical loosening due to an unstable fracture.  Recommend CT for further evaluation.   Original Report Authenticated By: Rudie Meyer, M.D.    Dg Femur Right  04/24/2012  *RADIOLOGY REPORT*  Clinical Data: Right hip and femoral pain post IM nailing  RIGHT FEMUR - 2 VIEW  Comparison: None  Findings: IM nail to distal locking screws identified at the mid to distal right femur. Proximal femur excluded Hip radiograph exam. Hip radiographs. No acute fracture or dislocation identified at the mid to distal femur. Lytic focus is identified at the mid right femoral diaphysis worried for a lytic metastasis. No additional areas of bone destruction identified.  IMPRESSION: Post  nailing of right femur with a questionable lucent focus at the mid right femoral diaphysis, approximately 1.4 cm in size, worrisome for a lytic metastasis.   Original Report Authenticated By: Ulyses Southward, M.D.    Dg Femur Right  04/22/2012  *RADIOLOGY REPORT*  Clinical Data: NAIL PLACEMENT.  RIGHT FEMUR - 2 VIEW,DG C-ARM 1-60 MIN  Comparison: MRI 04/18/2012  Findings: Multiple intraoperative spot images demonstrate placement of intermedullary nail and dynamic hip screw within the right femur.  No hardware or bony complicating feature.  IMPRESSION: Internal fixation as above.   Original Report Authenticated By: Charlett Nose, M.D.    Ct Head Wo Contrast  05/05/2012  *RADIOLOGY REPORT*  Clinical Data: Confusion, altered mental status, history osseous metastatic disease versus multiple myeloma by prior imaging  CT HEAD WITHOUT CONTRAST  Technique:  Contiguous axial images were obtained from the base of the skull through the vertex  without contrast.  Comparison: None  Findings: Few motion artifacts, for which repeat imaging was performed. Mild age-related atrophy. Normal ventricular morphology. No midline shift or mass effect. Otherwise normal appearance brain parenchyma. No intracranial hemorrhage, mass lesion, evidence of acute infarction or extra-axial fluid collection. Bones sinuses unremarkable.  IMPRESSION: No acute intracranial abnormalities.   Original Report Authenticated By: Ulyses Southward, M.D.    Ct Chest W Contrast  04/21/2012  *RADIOLOGY REPORT*  Clinical Data:  Metastatic survey  CT CHEST, ABDOMEN AND PELVIS WITH CONTRAST  Technique:  Multidetector CT imaging of the chest, abdomen and pelvis was performed following the standard protocol during bolus administration of intravenous contrast.  Contrast: OMNIPAQUE IOHEXOL 300 MG/ML  SOLN  Comparison:  04/18/2012  CT CHEST  Findings:  Lungs/pleura: Nonspecific subpleural nodule in the right upper lobe measures 5 mm, image 41. Parenchymal nodule in the right upper lobe measures 5 mm, image 33.  Left lower lobe subpleural nodule measures 5 mm, image 42.  Dependent changes are noted in both lung bases posteriorly.  Heart/Mediastinum: Normal heart size.  No pericardial effusion.  No mediastinal or hilar adenopathy.  Bones/Musculoskeletal:  Small lytic lesion is identified within the manubrium.  This measures approximately 1 cm, image 21/series 3. Lucent lesion within the T9 vertebra measures 10 mm, which is favored to represent a benign vertebral hemangioma.  IMPRESSION:  1.  Small nonspecific pulmonary nodules measure up to 5 mm. 2.  Focal lytic lesion is identified within the manubrium.  CT ABDOMEN AND PELVIS  Findings:  Mild diffuse low attenuation within the liver parenchyma identified.  No this suspicious liver abnormality noted.  1.1 cm stone noted within the gallbladder.  No biliary dilatation.  The pancreas appears normal.  Normal appearance of the spleen.  The adrenal glands  are both normal.  Normal appearance of the kidneys.  The urinary bladder appears within normal limits. Prostate gland and seminal vesicles are unremarkable.  No enlarged lymph nodes within the upper abdomen.  The abdominal aorta has a normal caliber.  No pelvic or inguinal adenopathy identified.  No free fluid or fluid collections within the abdomen or pelvis. The stomach appears normal.  The small bowel loops have a normal course and caliber.  Normal appearance of the appendix.  Large mass within the proximal transverse colon is identified.  This measures 7.4 x 4.4 x 4.8 cm.  No obstruction.  Review of the visualized osseous structures again shows a expansile lytic lesion involving the proximal right femur at the level of the lesser trochanter.  This measures approximately 4.8 x 4.3  x 5.5 cm. No additional suspicious bone lesions identified.  IMPRESSION:  1.  Mass within the proximal transverse colon is concerning for primary colonic neoplasm.  Correlation with colonoscopy is suggested. 2.  Expansile lytic lesion involving the proximal right femur is identified. This is favored to represent osseous metastasis. Primary bone neoplasm is not excluded but is considered less favored. 3.  Hepatic steatosis. 4.  Gallstone.   Original Report Authenticated By: Signa Kell, M.D.    Ct Abdomen Pelvis W Contrast  04/21/2012  *RADIOLOGY REPORT*  Clinical Data:  Metastatic survey  CT CHEST, ABDOMEN AND PELVIS WITH CONTRAST  Technique:  Multidetector CT imaging of the chest, abdomen and pelvis was performed following the standard protocol during bolus administration of intravenous contrast.  Contrast: OMNIPAQUE IOHEXOL 300 MG/ML  SOLN  Comparison:  04/18/2012  CT CHEST  Findings:  Lungs/pleura: Nonspecific subpleural nodule in the right upper lobe measures 5 mm, image 41. Parenchymal nodule in the right upper lobe measures 5 mm, image 33.  Left lower lobe subpleural nodule measures 5 mm, image 42.  Dependent changes are  noted in both lung bases posteriorly.  Heart/Mediastinum: Normal heart size.  No pericardial effusion.  No mediastinal or hilar adenopathy.  Bones/Musculoskeletal:  Small lytic lesion is identified within the manubrium.  This measures approximately 1 cm, image 21/series 3. Lucent lesion within the T9 vertebra measures 10 mm, which is favored to represent a benign vertebral hemangioma.  IMPRESSION:  1.  Small nonspecific pulmonary nodules measure up to 5 mm. 2.  Focal lytic lesion is identified within the manubrium.  CT ABDOMEN AND PELVIS  Findings:  Mild diffuse low attenuation within the liver parenchyma identified.  No this suspicious liver abnormality noted.  1.1 cm stone noted within the gallbladder.  No biliary dilatation.  The pancreas appears normal.  Normal appearance of the spleen.  The adrenal glands are both normal.  Normal appearance of the kidneys.  The urinary bladder appears within normal limits. Prostate gland and seminal vesicles are unremarkable.  No enlarged lymph nodes within the upper abdomen.  The abdominal aorta has a normal caliber.  No pelvic or inguinal adenopathy identified.  No free fluid or fluid collections within the abdomen or pelvis. The stomach appears normal.  The small bowel loops have a normal course and caliber.  Normal appearance of the appendix.  Large mass within the proximal transverse colon is identified.  This measures 7.4 x 4.4 x 4.8 cm.  No obstruction.  Review of the visualized osseous structures again shows a expansile lytic lesion involving the proximal right femur at the level of the lesser trochanter.  This measures approximately 4.8 x 4.3 x 5.5 cm. No additional suspicious bone lesions identified.  IMPRESSION:  1.  Mass within the proximal transverse colon is concerning for primary colonic neoplasm.  Correlation with colonoscopy is suggested. 2.  Expansile lytic lesion involving the proximal right femur is identified. This is favored to represent osseous  metastasis. Primary bone neoplasm is not excluded but is considered less favored. 3.  Hepatic steatosis. 4.  Gallstone.   Original Report Authenticated By: Signa Kell, M.D.    Ct Hip Right Wo Contrast  05/01/2012  *RADIOLOGY REPORT*  Clinical Data: Pathologic fracture of the right hip.  Severe pain.  CT OF THE RIGHT HIP WITHOUT CONTRAST  Technique:  Multidetector CT imaging was performed according to the standard protocol. Multiplanar CT image reconstructions were also generated.  Comparison: MRI dated 04/18/2012, CT scan dated 04/21/2012  and radiographs dated 01/07 and 01/09 and 04/29/2012  Findings: The intramedullary rod and compression screw appear in excellent position.  The tip of the screw is adjacent to the fovea but does not protrude beyond the contour of the femoral head. Joint spaces well preserved.  The small metastatic lesion in the right ischial tuberosity seen on MRI is not apparent on this CT scan.  There is destruction of the lesser trochanter with avulsion of the attachment of the iliopsoas tendon.  There is also partial avulsion of attachment quadratus femoris muscle.  IMPRESSION:  1.  The compression screw and intramedullary rod appear in excellent position in the right hip. 2.  Pathologic fracture and destruction of the lesser trochanter with at least partial avulsion of the attachments of the iliopsoas tendon and quadratus femoris muscle.   Original Report Authenticated By: Francene Boyers, M.D.    Dg Chest Port 1 View  05/05/2012  *RADIOLOGY REPORT*  Clinical Data: Hypotension and confusion.  Recent hip replacement.  PORTABLE CHEST - 1 VIEW  Comparison: CT chest and chest radiograph 04/21/2012.  Findings: Trachea is midline.  Heart size normal.  Subsegmental atelectasis in both perihilar regions.  Lungs are low in volume but otherwise clear.  No pleural fluid.  IMPRESSION: Minimal subsegmental atelectasis in the perihilar regions.   Original Report Authenticated By: Leanna Battles, M.D.     Dg Chest Portable 1 View  04/21/2012  *RADIOLOGY REPORT*  Clinical Data: Preoperative respiratory evaluation.  PORTABLE CHEST - 1 VIEW  Comparison: None.  Findings: Right lung is clear.  There is some linear atelectasis or subtle scarring in the left midlung.  No edema or focal airspace consolidation. Cardiopericardial silhouette is at upper limits of normal for size. Imaged bony structures of the thorax are intact.  IMPRESSION: No acute cardiopulmonary findings.   Original Report Authenticated By: Kennith Center, M.D.    Dg C-arm 1-60 Min  04/22/2012  *RADIOLOGY REPORT*  Clinical Data: NAIL PLACEMENT.  RIGHT FEMUR - 2 VIEW,DG C-ARM 1-60 MIN  Comparison: MRI 04/18/2012  Findings: Multiple intraoperative spot images demonstrate placement of intermedullary nail and dynamic hip screw within the right femur.  No hardware or bony complicating feature.  IMPRESSION: Internal fixation as above.   Original Report Authenticated By: Charlett Nose, M.D.     Microbiology: Recent Results (from the past 240 hour(s))  CULTURE, BLOOD (ROUTINE X 2)     Status: Normal (Preliminary result)   Collection Time   05/05/12  8:30 AM      Component Value Range Status Comment   Specimen Description BLOOD RIGHT ANTECUBITAL   Final    Special Requests BOTTLES DRAWN AEROBIC AND ANAEROBIC 10CC   Final    Culture  Setup Time 05/05/2012 14:10   Final    Culture     Final    Value:        BLOOD CULTURE RECEIVED NO GROWTH TO DATE CULTURE WILL BE HELD FOR 5 DAYS BEFORE ISSUING A FINAL NEGATIVE REPORT   Report Status PENDING   Incomplete   CULTURE, BLOOD (ROUTINE X 2)     Status: Normal (Preliminary result)   Collection Time   05/05/12  8:40 AM      Component Value Range Status Comment   Specimen Description BLOOD RIGHT ANTECUBITAL   Final    Special Requests BOTTLES DRAWN AEROBIC ONLY 10CC   Final    Culture  Setup Time 05/05/2012 14:10   Final    Culture     Final  Value:        BLOOD CULTURE RECEIVED NO GROWTH TO DATE  CULTURE WILL BE HELD FOR 5 DAYS BEFORE ISSUING A FINAL NEGATIVE REPORT   Report Status PENDING   Incomplete   URINE CULTURE     Status: Normal   Collection Time   05/05/12 10:03 AM      Component Value Range Status Comment   Specimen Description URINE, CLEAN CATCH   Final    Special Requests NONE   Final    Culture  Setup Time 05/05/2012 10:53   Final    Colony Count NO GROWTH   Final    Culture NO GROWTH   Final    Report Status 05/06/2012 FINAL   Final      Labs: Basic Metabolic Panel:  Lab 05/06/12 4098 05/05/12 1530 05/05/12 0826  NA 138 -- 137  K 3.7 -- 4.5  CL 102 -- 100  CO2 27 -- 26  GLUCOSE 90 -- 101*  BUN 11 -- 9  CREATININE 0.93 0.77 0.95  CALCIUM 9.2 -- 9.3  MG -- -- --  PHOS -- -- --   Liver Function Tests:  Lab 05/05/12 0826  AST 19  ALT 12  ALKPHOS 119*  BILITOT 0.6  PROT 6.4  ALBUMIN 3.5   No results found for this basename: LIPASE:5,AMYLASE:5 in the last 168 hours No results found for this basename: AMMONIA:5 in the last 168 hours CBC:  Lab 05/06/12 0530 05/05/12 1530 05/05/12 0826  WBC 8.3 11.4* 11.0*  NEUTROABS -- -- 9.4*  HGB 10.3* 11.2* 11.2*  HCT 33.0* 35.1* 35.8*  MCV 71.9* 71.6* 71.7*  PLT 390 386 372   Cardiac Enzymes: No results found for this basename: CKTOTAL:5,CKMB:5,CKMBINDEX:5,TROPONINI:5 in the last 168 hours BNP: BNP (last 3 results) No results found for this basename: PROBNP:3 in the last 8760 hours CBG: No results found for this basename: GLUCAP:5 in the last 168 hours     Signed:  Duha Abair  [facility] 05/06/2012, 3:12 PM

## 2012-05-06 NOTE — Progress Notes (Signed)
PT Cancellation Note  Patient Details Name: Justin Henson MRN: [id number] DOB: 1945-05-27   Cancelled Treatment:    Reason Eval/Treat Not Completed: Other (comment)  Had attempted PT eval earlier in am, but pt at Vascular Lab ([facility]);  Discussed pt with RN, who reports pt is requesting no therapies today, and would rather initiate therapies tomorrow;    [facility] 05/06/2012, 12:19 PM

## 2012-05-06 NOTE — Progress Notes (Signed)
  Echocardiogram 2D Echocardiogram has been performed.  Cathie Beams 05/06/2012, 9:24 AM

## 2012-05-07 ENCOUNTER — Ambulatory Visit
Admit: 2012-05-07 | Discharge: 2012-05-07 | Disposition: A | Discharge status: Home / Self Care | Payer: 59 | Attending: Radiation Oncology | Admitting: Radiation Oncology

## 2012-05-07 ENCOUNTER — Ambulatory Visit
Admit: 2012-05-07 | Discharge: 2012-05-07 | Disposition: A | Discharge status: Home / Self Care | Payer: Medicare Other | Attending: Radiation Oncology | Admitting: Radiation Oncology

## 2012-05-07 ENCOUNTER — Ambulatory Visit: Payer: Medicare Other | Admitting: Radiation Oncology

## 2012-05-07 ENCOUNTER — Telehealth: Payer: Self-pay | Admitting: Radiation Oncology

## 2012-05-07 ENCOUNTER — Ambulatory Visit: Payer: 59 | Admitting: Radiation Oncology

## 2012-05-07 ENCOUNTER — Ambulatory Visit: Payer: Medicare Other

## 2012-05-07 DIAGNOSIS — Z510 Encounter for antineoplastic radiation therapy: Secondary | ICD-10-CM | POA: Insufficient documentation

## 2012-05-07 DIAGNOSIS — C801 Malignant (primary) neoplasm, unspecified: Secondary | ICD-10-CM | POA: Insufficient documentation

## 2012-05-07 DIAGNOSIS — C7951 Secondary malignant neoplasm of bone: Secondary | ICD-10-CM | POA: Insufficient documentation

## 2012-05-07 DIAGNOSIS — C7952 Secondary malignant neoplasm of bone marrow: Secondary | ICD-10-CM

## 2012-05-07 NOTE — Progress Notes (Signed)
  Radiation Oncology         [phone] ________________________________  Name: Justin Henson MRN: [id number]  Date: 05/07/2012  DOB: 1945/12/26  SIMULATION AND TREATMENT PLANNING NOTE  DIAGNOSIS:  Metastatic carcinoma of unknown primary  NARRATIVE:  The patient was brought to the CT Simulation planning suite.  Identity was confirmed.  All relevant records and images related to the planned course of therapy were reviewed.   Written consent to proceed with treatment was confirmed which was freely given after reviewing the details related to the planned course of therapy had been reviewed with the patient.  Then, the patient was set-up in a stable reproducible  supine position for radiation therapy.  CT images were obtained.  Surface markings were placed.  A customized VAC lock bag was constructed to help with patient immobilization during his treatment.  The CT images were loaded into the planning software.  Then the target and avoidance structures were contoured.  Treatment planning then occurred.  The radiation prescription was entered and confirmed.  A total of 2 complex treatment devices were fabricated which relate to the designed radiation treatment fields. Each of these customized fields/ complex treatment devices will be used on a daily basis during the radiation course. I have requested : Isodose Plan.   PLAN:  The patient will receive 30 Gy in 10 fractions.  ________________________________   Radene Gunning, MD, PhD

## 2012-05-07 NOTE — Telephone Encounter (Signed)
Met w pt's wife Clydie Braun) to discuss RO billing. Pt had no financial concerns today.  Dx: Syncope - Primary 780.2  & Cancer with unknown primary site 199.1   Attending Rad:  JM   Rad Tx: Daily

## 2012-05-08 NOTE — Discharge Summary (Signed)
Patient ID: Justin Henson MRN: [id number] DOB/AGE: October 20, 1945 67 y.o.  Admit date: 04/21/2012 Discharge date: 04/26/12  Admission Diagnoses:  1.  Pathologic fracture right proximal femur. 2.  Unrelenting right hip pain secondary to the above. 3.  Metastatic cancer involving right femur.  Unknown primary source.  Discharge Diagnoses:  Same Colon cancer involving transverse colon.  Past Medical History  Diagnosis Date  . Cancer 04/24/2012    lesions currently under diagnosis  . Shortness of breath     on exertion  . Arthritis   . Syncope   . Hypotension     Surgeries: Procedure(s): COLONOSCOPY on 04/21/2012 - 04/25/2012 Open reduction/internal fixation right hip/proximal femur with intramedullary nail     Jodi Geralds M.D.  04/22/12   Consultants: Treatment Team:  Petra Kuba, MD Oncology Dr. Truett Perna Inpatient rehabilitation Dr. Riley Kill   Discharged Condition: Improved  Hospital Course: Justin Henson is an 67 y.o. male who was admitted 04/21/2012 for operative treatment ofunrelenting right hip pain with an MRI scan showing a pathologic fracture/large lesion in the right proximal femur. Patient has severe unremitting pain that affects sleep, daily activities, and work/hobbies. After pre-op clearance the patient was taken to the operating room on 04/21/2012 - 04/25/2012 and underwent  Procedure(s):open reduction/internal fixation of right proximal femur fracture with an intramedullary nail.  04/22/12. COLONOSCOPY.    Patient was given perioperative antibiotics: Anti-infectives     Start     Dose/Rate Route Frequency Ordered Stop   04/22/12 1400   ceFAZolin (ANCEF) IVPB 2 g/50 mL premix        2 g 100 mL/hr over 30 Minutes Intravenous Every 6 hours 04/22/12 1158 04/22/12 2050   04/22/12 0600   ceFAZolin (ANCEF) IVPB 2 g/50 mL premix        2 g 100 mL/hr over 30 Minutes Intravenous On call to O.R. 04/21/12 1723 04/22/12 0800           Patient was given sequential compression  devices, early ambulation, and chemoprophylaxis to prevent DVT. The patient underwent ORIF of his right hip/femur on a 04/22/12.tthis was performed by Dr. Jodi Geralds.  Postoperatively he was placed on IV Lovenox and IV pain medication.  Physical therapy was ordered for walker ambulation partial weightbearing on the right.  He had continued pain in his hip and x-rays were ordered showing satisfactory position of the hardware.  He has a very large lesion in his right proximal femur with an associated fracture.  He was felt to be a candidate for comprehensive inpatient rehabilitation.  Oncology evaluated the patient related to  cancer.  Gastroenterology was also involved as a CT scan showed a tumor in his transverse colon.colonoscopy was performed.  Recent vital signs: see chart.      Discharge Medications:     Medication List     As of 05/08/2012  5:45 PM    ASK your doctor about these medications         loratadine-pseudoephedrine 10-240 MG per 24 hr tablet   Commonly known as: CLARITIN-D 24-hour   Take 1 tablet by mouth daily.      oxyCODONE-acetaminophen 5-325 MG per tablet   Commonly known as: PERCOCET/ROXICET   Take 1 tablet by mouth every 6 (six) hours as needed. For pain        Diagnostic Studies: Dg Hip Complete Right  04/24/2012  *RADIOLOGY REPORT*  Clinical Data: Right hip and femoral pain post ORIF  RIGHT HIP - COMPLETE 2+ VIEW  Comparison: 04/22/2012 Correlation:  MRI right hip 04/18/2012  Findings: Diffuse osseous demineralization. IM nail with compression screw identified at proximal right femur. A large area bone destruction is identified at the medial aspect of the proximal femur including lesser trochanteric region, corresponding to mass identified on prior MR. Fracture of the proximal right femur is identified through the destructive lesion, unchanged since intraoperative images. No dislocation identified. Visualized pelvis intact. Pelvic phleboliths noted.  IMPRESSION:  Orthopedic hardware proximal right femur across a large destructive lesion at the medial aspect of proximal right femur. Fracture identified inferior to the greater trochanter extending through the destructive lesion, unchanged since the intraoperative images of 04/22/2012.a   Original Report Authenticated By: Ulyses Southward, M.D.    Dg Femur Right  04/29/2012  *RADIOLOGY REPORT*  Clinical Data: Postop pathologic fracture fixation with persistent pain.  RIGHT FEMUR - 2 VIEW  Comparison:  Findings: The intramedullary rod and dynamic hip screw are stable in position.  There appears to be mild lucency around the screw threads.  This may be mechanical and due to extensive tumor and unstable fracture.  CT may be helpful for further evaluation. Findings discussed with Dr. Luiz Blare.  IMPRESSION:  1.  No obvious change in position of the fixating hardware. 2.  Suspect lucency around the threads of the dynamic hip screw which could suggest mechanical loosening due to an unstable fracture.  Recommend CT for further evaluation.   Original Report Authenticated By: Rudie Meyer, M.D.    Dg Femur Right  04/24/2012  *RADIOLOGY REPORT*  Clinical Data: Right hip and femoral pain post IM nailing  RIGHT FEMUR - 2 VIEW  Comparison: None  Findings: IM nail to distal locking screws identified at the mid to distal right femur. Proximal femur excluded Hip radiograph exam. Hip radiographs. No acute fracture or dislocation identified at the mid to distal femur. Lytic focus is identified at the mid right femoral diaphysis worried for a lytic metastasis. No additional areas of bone destruction identified.  IMPRESSION: Post nailing of right femur with a questionable lucent focus at the mid right femoral diaphysis, approximately 1.4 cm in size, worrisome for a lytic metastasis.   Original Report Authenticated By: Ulyses Southward, M.D.    Dg Femur Right  04/22/2012  *RADIOLOGY REPORT*  Clinical Data: NAIL PLACEMENT.  RIGHT FEMUR - 2 VIEW,DG C-ARM  1-60 MIN  Comparison: MRI 04/18/2012  Findings: Multiple intraoperative spot images demonstrate placement of intermedullary nail and dynamic hip screw within the right femur.  No hardware or bony complicating feature.  IMPRESSION: Internal fixation as above.   Original Report Authenticated By: Charlett Nose, M.D.     Ct Chest W Contrast  04/21/2012  *RADIOLOGY REPORT*  Clinical Data:  Metastatic survey  CT CHEST, ABDOMEN AND PELVIS WITH CONTRAST  Technique:  Multidetector CT imaging of the chest, abdomen and pelvis was performed following the standard protocol during bolus administration of intravenous contrast.  Contrast: OMNIPAQUE IOHEXOL 300 MG/ML  SOLN  Comparison:  04/18/2012  CT CHEST  Findings:  Lungs/pleura: Nonspecific subpleural nodule in the right upper lobe measures 5 mm, image 41. Parenchymal nodule in the right upper lobe measures 5 mm, image 33.  Left lower lobe subpleural nodule measures 5 mm, image 42.  Dependent changes are noted in both lung bases posteriorly.  Heart/Mediastinum: Normal heart size.  No pericardial effusion.  No mediastinal or hilar adenopathy.  Bones/Musculoskeletal:  Small lytic lesion is identified within the manubrium.  This measures approximately 1 cm, image  21/series 3. Lucent lesion within the T9 vertebra measures 10 mm, which is favored to represent a benign vertebral hemangioma.  IMPRESSION:  1.  Small nonspecific pulmonary nodules measure up to 5 mm. 2.  Focal lytic lesion is identified within the manubrium.  CT ABDOMEN AND PELVIS  Findings:  Mild diffuse low attenuation within the liver parenchyma identified.  No this suspicious liver abnormality noted.  1.1 cm stone noted within the gallbladder.  No biliary dilatation.  The pancreas appears normal.  Normal appearance of the spleen.  The adrenal glands are both normal.  Normal appearance of the kidneys.  The urinary bladder appears within normal limits. Prostate gland and seminal vesicles are unremarkable.  No  enlarged lymph nodes within the upper abdomen.  The abdominal aorta has a normal caliber.  No pelvic or inguinal adenopathy identified.  No free fluid or fluid collections within the abdomen or pelvis. The stomach appears normal.  The small bowel loops have a normal course and caliber.  Normal appearance of the appendix.  Large mass within the proximal transverse colon is identified.  This measures 7.4 x 4.4 x 4.8 cm.  No obstruction.  Review of the visualized osseous structures again shows a expansile lytic lesion involving the proximal right femur at the level of the lesser trochanter.  This measures approximately 4.8 x 4.3 x 5.5 cm. No additional suspicious bone lesions identified.  IMPRESSION:  1.  Mass within the proximal transverse colon is concerning for primary colonic neoplasm.  Correlation with colonoscopy is suggested. 2.  Expansile lytic lesion involving the proximal right femur is identified. This is favored to represent osseous metastasis. Primary bone neoplasm is not excluded but is considered less favored. 3.  Hepatic steatosis. 4.  Gallstone.   Original Report Authenticated By: Signa Kell, M.D.    Ct Abdomen Pelvis W Contrast  04/21/2012  *RADIOLOGY REPORT*  Clinical Data:  Metastatic survey  CT CHEST, ABDOMEN AND PELVIS WITH CONTRAST  Technique:  Multidetector CT imaging of the chest, abdomen and pelvis was performed following the standard protocol during bolus administration of intravenous contrast.  Contrast: OMNIPAQUE IOHEXOL 300 MG/ML  SOLN  Comparison:  04/18/2012  CT CHEST  Findings:  Lungs/pleura: Nonspecific subpleural nodule in the right upper lobe measures 5 mm, image 41. Parenchymal nodule in the right upper lobe measures 5 mm, image 33.  Left lower lobe subpleural nodule measures 5 mm, image 42.  Dependent changes are noted in both lung bases posteriorly.  Heart/Mediastinum: Normal heart size.  No pericardial effusion.  No mediastinal or hilar adenopathy.   Bones/Musculoskeletal:  Small lytic lesion is identified within the manubrium.  This measures approximately 1 cm, image 21/series 3. Lucent lesion within the T9 vertebra measures 10 mm, which is favored to represent a benign vertebral hemangioma.  IMPRESSION:  1.  Small nonspecific pulmonary nodules measure up to 5 mm. 2.  Focal lytic lesion is identified within the manubrium.  CT ABDOMEN AND PELVIS  Findings:  Mild diffuse low attenuation within the liver parenchyma identified.  No this suspicious liver abnormality noted.  1.1 cm stone noted within the gallbladder.  No biliary dilatation.  The pancreas appears normal.  Normal appearance of the spleen.  The adrenal glands are both normal.  Normal appearance of the kidneys.  The urinary bladder appears within normal limits. Prostate gland and seminal vesicles are unremarkable.  No enlarged lymph nodes within the upper abdomen.  The abdominal aorta has a normal caliber.  No pelvic or  inguinal adenopathy identified.  No free fluid or fluid collections within the abdomen or pelvis. The stomach appears normal.  The small bowel loops have a normal course and caliber.  Normal appearance of the appendix.  Large mass within the proximal transverse colon is identified.  This measures 7.4 x 4.4 x 4.8 cm.  No obstruction.  Review of the visualized osseous structures again shows a expansile lytic lesion involving the proximal right femur at the level of the lesser trochanter.  This measures approximately 4.8 x 4.3 x 5.5 cm. No additional suspicious bone lesions identified.  IMPRESSION:  1.  Mass within the proximal transverse colon is concerning for primary colonic neoplasm.  Correlation with colonoscopy is suggested. 2.  Expansile lytic lesion involving the proximal right femur is identified. This is favored to represent osseous metastasis. Primary bone neoplasm is not excluded but is considered less favored. 3.  Hepatic steatosis. 4.  Gallstone.   Original Report Authenticated  By: Signa Kell, M.D.    Ct Hip Right Wo Contrast  05/01/2012  *RADIOLOGY REPORT*  Clinical Data: Pathologic fracture of the right hip.  Severe pain.  CT OF THE RIGHT HIP WITHOUT CONTRAST  Technique:  Multidetector CT imaging was performed according to the standard protocol. Multiplanar CT image reconstructions were also generated.  Comparison: MRI dated 04/18/2012, CT scan dated 04/21/2012 and radiographs dated 01/07 and 01/09 and 04/29/2012  Findings: The intramedullary rod and compression screw appear in excellent position.  The tip of the screw is adjacent to the fovea but does not protrude beyond the contour of the femoral head. Joint spaces well preserved.  The small metastatic lesion in the right ischial tuberosity seen on MRI is not apparent on this CT scan.  There is destruction of the lesser trochanter with avulsion of the attachment of the iliopsoas tendon.  There is also partial avulsion of attachment quadratus femoris muscle.  IMPRESSION:  1.  The compression screw and intramedullary rod appear in excellent position in the right hip. 2.  Pathologic fracture and destruction of the lesser trochanter with at least partial avulsion of the attachments of the iliopsoas tendon and quadratus femoris muscle.   Original Report Authenticated By: Francene Boyers, M.D.    Dg Chest Port 1 View  05/05/2012  *RADIOLOGY REPORT*  Clinical Data: Hypotension and confusion.  Recent hip replacement.  PORTABLE CHEST - 1 VIEW  Comparison: CT chest and chest radiograph 04/21/2012.  Findings: Trachea is midline.  Heart size normal.  Subsegmental atelectasis in both perihilar regions.  Lungs are low in volume but otherwise clear.  No pleural fluid.  IMPRESSION: Minimal subsegmental atelectasis in the perihilar regions.   Original Report Authenticated By: Leanna Battles, M.D.    Dg Chest Portable 1 View  04/21/2012  *RADIOLOGY REPORT*  Clinical Data: Preoperative respiratory evaluation.  PORTABLE CHEST - 1 VIEW   Comparison: None.  Findings: Right lung is clear.  There is some linear atelectasis or subtle scarring in the left midlung.  No edema or focal airspace consolidation. Cardiopericardial silhouette is at upper limits of normal for size. Imaged bony structures of the thorax are intact.  IMPRESSION: No acute cardiopulmonary findings.   Original Report Authenticated By: Kennith Center, M.D.    Dg C-arm 1-60 Min  04/22/2012  *RADIOLOGY REPORT*  Clinical Data: NAIL PLACEMENT.  RIGHT FEMUR - 2 VIEW,DG C-ARM 1-60 MIN  Comparison: MRI 04/18/2012  Findings: Multiple intraoperative spot images demonstrate placement of intermedullary nail and dynamic hip screw within the right femur.  No hardware or bony complicating feature.  IMPRESSION: Internal fixation as above.   Original Report Authenticated By: Charlett Nose, M.D.     Disposition: CIR      Discharge Orders    Future Appointments: Provider: Department: Dept Phone: Center:   05/09/2012 10:30 AM [facility] MED[facility]COL[facility] [phone] None   05/09/2012 11:00 AM Ladene Artist, MD [facility] MEDICAL ONCOLOGY [phone] None   05/14/2012 3:00 PM Jonna Coup, MD [facility] RADIATION ONCOLOGY [phone] None     Joint Appt [facility]COL[facility] [phone] None   05/15/2012 3:10 PM [facility] ONCOL[facility] [phone] None   05/16/2012 3:10 PM [facility]COL[facility] [phone] None   05/19/2012 3:20 PM [facility]COL[facility] [phone] None   05/20/2012 10:00 AM Ranelle Oyster, MD [facility] [phone] CPR   05/20/2012 3:20 PM [facility] ONCOL[facility] [phone] None   05/21/2012 3:20 PM [facility]  RADIATION ONCOLOGY [phone] None   05/22/2012 3:20 PM [facility] RAD[facility] ONCOL[facility] [phone] None   05/23/2012 3:20 PM [facility] [phone] None   05/26/2012 3:20 PM [facility] RAD[facility] ONCOLOG[facility] [phone] None   05/27/2012 3:20 PM [facility] RAD[facility] ONCOLOGY [phone] None   05/28/2012 3:20 PM [facility]COL[facility] [phone] None   05/29/2012 3:20 PM [facility]COL[facility] [phone] None   05/30/2012 3:20 PM [facility] RAD[facility] ONCOLOG[facility] [phone] None   06/02/2012 3:20 PM [facility] RAD[facility] ONCOL[facility] [phone] None   06/03/2012 3:20 PM [facility] [phone] None      Follow-up Information    Follow up with GRAVES,JOHN L, MD. Schedule an appointment as soon as possible for a visit in 3 weeks.   Contact information:   [address] [place] [address] [phone]           Signed: Matthew Folks 05/08/2012, 5:45 PM

## 2012-05-09 ENCOUNTER — Ambulatory Visit (HOSPITAL_BASED_OUTPATIENT_CLINIC_OR_DEPARTMENT_OTHER): Payer: 59 | Admitting: Oncology

## 2012-05-09 ENCOUNTER — Telehealth: Payer: Self-pay | Admitting: Oncology

## 2012-05-09 ENCOUNTER — Ambulatory Visit: Payer: Medicare Other

## 2012-05-09 VITALS — BP 102/63 | HR 90 | Temp 97.5°F | Resp 20 | Ht 75.0 in | Wt 217.4 lb

## 2012-05-09 DIAGNOSIS — K639 Disease of intestine, unspecified: Secondary | ICD-10-CM

## 2012-05-09 DIAGNOSIS — C7952 Secondary malignant neoplasm of bone marrow: Secondary | ICD-10-CM

## 2012-05-09 DIAGNOSIS — C801 Malignant (primary) neoplasm, unspecified: Secondary | ICD-10-CM

## 2012-05-09 DIAGNOSIS — R918 Other nonspecific abnormal finding of lung field: Secondary | ICD-10-CM

## 2012-05-09 NOTE — Telephone Encounter (Signed)
appt made and pritned for pt pt aware that they will get a call with the scan appt

## 2012-05-09 NOTE — Progress Notes (Signed)
   [facility]    OFFICE PROGRESS NOTE   INTERVAL HISTORY:   He returns as scheduled. I saw him in the hospital earlier this month when he was diagnosed with metastatic carcinoma of unknown primary involving a right femur lesion. An extensive diagnostic evaluation including CT scans and an upper/lower endoscopy did not identify a primary tumor site.  He underwent stabilization of the right femur followed by rehabilitation medicine stay. He was discharged on 05/01/2012. He had a syncope event on 05/05/2012 and was readmitted. He was observed overnight and had no recurrent syncope. The syncope that was felt to most likely be related to orthostasis.  The pain at the right groin and femur is controlled with MS Contin/oxycodone. He takes MS Contin once or twice daily. Constipation was relieved with magnesium citrate.  Objective:  Vital signs in last 24 hours:  Blood pressure 102/63, pulse 90, temperature 97.5 F (36.4 C), temperature source Oral, resp. rate 20, height 6\' 3"  (1.905 m), weight 217 lb 6.4 oz (98.612 kg).    HEENT: Neck without mass Lymphatics: No cervical, supraclavicular, or axillary nodes Resp: Lungs clear bilaterally Cardio: Regular rate and rhythm GI: No hepatosplenomegaly, nontender Vascular: No leg edema Skin: Surgical incisions with staples at the right thigh without evidence of infection   Lab Results:  Lab Results  Component Value Date   WBC 8.3 05/06/2012   HGB 10.3* 05/06/2012   HCT 33.0* 05/06/2012   MCV 71.9* 05/06/2012   PLT 390 05/06/2012      Medications: I have reviewed the patient's current medications.  Assessment/Plan: 1.Destructive lesion in the proximal right femur, status post intramedullary rodding of the right femur 04/23/2011, the pathology confirmed metastatic carcinoma. The immunohistochemical profile is not consistent with colon cancer.  - Staging CT evaluation confirming a transverse colon mass, multiple small lung  nodules, a lytic lesion at the sternum, and an expansile lesion at the proximal right femur  2. Red cell microcytosis -we will recommend he began iron therapy 3. Pain secondary to the right femur lesion  4. 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 Upper endoscopy 04/30/2012 with no evidence of malignancy  6. Syncope event 05/05/2012-negative noncontrast brain CT 05/05/2012  Disposition:  He has been diagnosed with metastatic carcinoma of unknown primary. A gene array assay cannot be submitted on the decalcified bone specimen. I discussed the diagnosis and treatment recommendations with Mr. Prows and his wife. He is scheduled to begin palliative radiation to the right femur on 05/15/2012. He will be scheduled for a staging PET scan within the next few weeks.  We will decide on the indication for systemic chemotherapy based on his performance status and the staging PET scan results. He will return for an office visit on 06/05/2011. He continues a home physical therapy program.   Thornton Papas, MD  05/09/2012  11:25 AM

## 2012-05-11 LAB — CULTURE, BLOOD (ROUTINE X 2): Culture: NO GROWTH

## 2012-05-14 ENCOUNTER — Ambulatory Visit: Payer: 59 | Admitting: Radiation Oncology

## 2012-05-15 ENCOUNTER — Ambulatory Visit: Payer: 59

## 2012-05-15 ENCOUNTER — Ambulatory Visit
Admission: RE | Admit: 2012-05-15 | Discharge: 2012-05-15 | Disposition: A | Discharge status: Home / Self Care | Payer: 59 | Source: Ambulatory Visit | Attending: Radiation Oncology | Admitting: Radiation Oncology

## 2012-05-15 ENCOUNTER — Encounter: Payer: Self-pay | Admitting: Radiation Oncology

## 2012-05-15 NOTE — Progress Notes (Signed)
  Radiation Oncology         [phone] ________________________________  Name: Justin Henson MRN: [id number]  Date: 05/15/2012  DOB: 18-Nov-1945  Simulation Verification Note   NARRATIVE: The patient was brought to the treatment unit and placed in the planned treatment position. The clinical setup was verified. Then port films were obtained and uploaded to the radiation oncology medical record software.  The treatment beams were carefully compared against the planned radiation fields. The position, location, and shape of the radiation fields was reviewed. The targeted volume of tissue appears to be appropriately covered by the radiation beams. Based on my personal review, I approved the simulation verification. The patient's treatment will proceed as planned.  ________________________________   Radene Gunning, MD, PhD

## 2012-05-16 ENCOUNTER — Ambulatory Visit
Admission: RE | Admit: 2012-05-16 | Discharge: 2012-05-16 | Disposition: A | Discharge status: Home / Self Care | Payer: 59 | Source: Ambulatory Visit | Attending: Radiation Oncology | Admitting: Radiation Oncology

## 2012-05-16 ENCOUNTER — Ambulatory Visit: Payer: 59

## 2012-05-16 ENCOUNTER — Telehealth: Payer: Self-pay | Admitting: *Deleted

## 2012-05-16 DIAGNOSIS — C7952 Secondary malignant neoplasm of bone marrow: Secondary | ICD-10-CM

## 2012-05-16 DIAGNOSIS — C7951 Secondary malignant neoplasm of bone: Secondary | ICD-10-CM

## 2012-05-16 MED ORDER — MORPHINE SULFATE 15 MG PO TABS: 15.0000 mg | ORAL_TABLET | ORAL | Status: DC | PRN

## 2012-05-16 NOTE — Progress Notes (Signed)
   Department of Radiation Oncology  Phone:  [phone] Fax:        [phone]  Weekly Treatment Note    Name: Justin Henson Date: 05/16/2012 MRN: [id number] DOB: 06/01/1945   Current dose: 6 Gy  Current fraction: 2   MEDICATIONS: Current Outpatient Prescriptions  Medication Sig Dispense Refill  . ALPRAZolam (XANAX) 0.5 MG tablet Take 1 tablet (0.5 mg total) by mouth 3 (three) times daily as needed for anxiety.  30 tablet  0  . methocarbamol (ROBAXIN) 500 MG tablet Take 1 tablet (500 mg total) by mouth every 6 (six) hours as needed.  60 tablet  0  . morphine (MS CONTIN) 30 MG 12 hr tablet Take 1 tablet (30 mg total) by mouth every 8 (eight) hours.  60 tablet  0  . morphine (MSIR) 15 MG tablet Take 1 tablet (15 mg total) by mouth every 4 (four) hours as needed for pain.  60 tablet  0  . oxyCODONE (OXY IR/ROXICODONE) 5 MG immediate release tablet Take 1-2 tablets (5-10 mg total) by mouth every 4 (four) hours as needed.  90 tablet  0  . senna-docusate (SENOKOT-S) 8.6-50 MG per tablet Take 2 tablets by mouth 2 (two) times daily.         ALLERGIES: Review of patient's allergies indicates no known allergies.   LABORATORY DATA:  Lab Results  Component Value Date   WBC 8.3 05/06/2012   HGB 10.3* 05/06/2012   HCT 33.0* 05/06/2012   MCV 71.9* 05/06/2012   PLT 390 05/06/2012   Lab Results  Component Value Date   NA 138 05/06/2012   K 3.7 05/06/2012   CL 102 05/06/2012   CO2 27 05/06/2012   Lab Results  Component Value Date   ALT 12 05/05/2012   AST 19 05/05/2012   ALKPHOS 119* 05/05/2012   BILITOT 0.6 05/05/2012     NARRATIVE: Justin Henson was seen today for weekly treatment management. The chart was checked and the patient's films were reviewed. The patient has been able to begin his treatment. He remains in some discomfort - hopefully this will improve as we proceed with treatment. No difficulties otherwise so far, no acute toxicity.  PHYSICAL EXAMINATION: vitals were  not taken for this visit.     patient is alert, no acute distress  ASSESSMENT: The patient is doing satisfactorily with treatment.  PLAN: We will continue with the patient's radiation treatment as planned.

## 2012-05-16 NOTE — Telephone Encounter (Signed)
Requesting short acting morphine for patient. Radiation appointments have become very painful from lying on table for so long.

## 2012-05-19 ENCOUNTER — Ambulatory Visit: Payer: 59

## 2012-05-19 ENCOUNTER — Ambulatory Visit
Admission: RE | Admit: 2012-05-19 | Discharge: 2012-05-19 | Disposition: A | Discharge status: Home / Self Care | Payer: 59 | Source: Ambulatory Visit | Attending: Radiation Oncology | Admitting: Radiation Oncology

## 2012-05-20 ENCOUNTER — Encounter: Payer: 59 | Attending: Physical Medicine & Rehabilitation | Admitting: Physical Medicine & Rehabilitation

## 2012-05-20 ENCOUNTER — Encounter: Payer: Self-pay | Admitting: Physical Medicine & Rehabilitation

## 2012-05-20 ENCOUNTER — Ambulatory Visit
Admission: RE | Admit: 2012-05-20 | Discharge: 2012-05-20 | Disposition: A | Discharge status: Home / Self Care | Payer: 59 | Source: Ambulatory Visit | Attending: Radiation Oncology | Admitting: Radiation Oncology

## 2012-05-20 ENCOUNTER — Ambulatory Visit: Payer: 59

## 2012-05-20 VITALS — BP 97/64 | HR 99 | Resp 14 | Ht 75.0 in | Wt 220.0 lb

## 2012-05-20 DIAGNOSIS — G8929 Other chronic pain: Secondary | ICD-10-CM | POA: Insufficient documentation

## 2012-05-20 DIAGNOSIS — C801 Malignant (primary) neoplasm, unspecified: Secondary | ICD-10-CM

## 2012-05-20 DIAGNOSIS — M84453A Pathological fracture, unspecified femur, initial encounter for fracture: Secondary | ICD-10-CM

## 2012-05-20 MED ORDER — MORPHINE SULFATE ER 30 MG PO TBCR: 30.0000 mg | EXTENDED_RELEASE_TABLET | Freq: Two times a day (BID) | ORAL | Status: DC

## 2012-05-20 NOTE — Patient Instructions (Signed)
Cal me with questions

## 2012-05-20 NOTE — Progress Notes (Signed)
Subjective:    Patient ID: Justin Henson, male    DOB: 1945-11-11, 67 y.o.   MRN: [id number]  HPI Mr. Guaman is back regarding his chronic pain and pathological right femur fx. He has 10 total radiation treatments scheduled for his fx. He has had 4 treatments so far.   There is no dx as of yet for his primary cancer. He has a PET scan scheduled this thursday.  His pain has been controlled except when he's stretched out on the table for his XRT. He stays at a TDWB level for his right leg currently per ortho recs and to avoid excessive pain.   From a pain standpoint he is using the MS Contin every 12 to 24 hours and uses the morphine IR for breakthrough (usually once per day). He hasn't used the oxycodone since last week.     Pain Inventory Average Pain 2 Pain Right Now 4 My pain is stabbing  In the last 24 hours, has pain interfered with the following? General activity 5 Relation with others 0 Enjoyment of life 5 What TIME of day is your pain at its worst? evening Sleep (in general) Fair  Pain is worse with: some activites Pain improves with: rest and medication Relief from Meds: 8  Mobility ability to climb steps?  no do you drive?  no use a wheelchair transfers alone Do you have any goals in this area?  yes  Function retired I need assistance with the following:  dressing, bathing, toileting, meal prep, household duties and shopping  Neuro/Psych weakness loss of taste or smell  Prior Studies x-rays CT/MRI  Physicians involved in your care Primary care [facility]   History reviewed. No pertinent family history. History   Social History  . Marital Status: Married    Spouse Name: N/A    Number of Children: N/A  . Years of Education: N/A   Social History Main Topics  . Smoking status: Never Smoker   . Smokeless tobacco: Never Used  . Alcohol Use: No  . Drug Use: No  . Sexually Active:    Other Topics Concern   . None   Social History Narrative  . None   Past Surgical History  Procedure Date  . Femur im nail 04/22/2012    Procedure: INTRAMEDULLARY (IM) NAIL FEMORAL;  Surgeon: Harvie Junior, MD;  Location: [facility] OR;  Service: Orthopedics;  Laterality: Right;  PROPHYLACTIC  IM ROD RIGHT FEMUR   . Colonoscopy 04/25/2012    Procedure: COLONOSCOPY;  Surgeon: Petra Kuba, MD;  Location: [facility] ENDOSCOPY;  Service: Endoscopy;  Laterality: N/A;  . Esophagogastroduodenoscopy 04/30/2012    Procedure: ESOPHAGOGASTRODUODENOSCOPY (EGD);  Surgeon: Petra Kuba, MD;  Location: [facility] ENDOSCOPY;  Service: Endoscopy;  Laterality: N/A;   Past Medical History  Diagnosis Date  . Cancer 04/24/2012    lesions currently under diagnosis  . Shortness of breath     on exertion  . Arthritis   . Syncope   . Hypotension    BP 97/64  Pulse 99  Resp 14  Ht 6\' 3"  (1.905 m)  Wt 220 lb (99.791 kg)  BMI 27.50 kg/m2  SpO2 96%     Review of Systems  Constitutional: Positive for appetite change and unexpected weight change.  Gastrointestinal: Positive for constipation.  Neurological: Positive for weakness.  All other systems reviewed and are negative.       Objective:   Physical Exam   General: Alert and oriented x  3, a little anxious. Appears uncomfortable. In wheelchair. HEENT: Head is normocephalic, atraumatic, PERRLA, EOMI, sclera anicteric, oral mucosa pink and moist, dentition intact, ext ear canals clear,  Neck: Supple without JVD or lymphadenopathy Heart: Reg rate and rhythm. No murmurs rubs or gallops Chest: CTA bilaterally without wheezes, rales, or rhonchi; no distress Abdomen: Soft, non-tender, non-distended, bowel sounds positive. Extremities: No clubbing, cyanosis, or edema. Pulses are 2+ Skin: Clean and intact without signs of breakdown Neuro: Pt alert and oriented but with short term memory deficits. Cranial nerves 2-12 are intact. Sensory exam is normal. Reflexes are 2+ in all 4's. Fine motor  coordination is intact. No tremors. Motor function is grossly 5/5 except for right HF. No sensory deficits.  Musculoskeletal: Continue severe pain at the right hip. Unable to flex right hip agst gravity Psych: Pt's affect is appropriate. Pt is cooperative        Assessment & Plan:  1. Pathological right femur fx.  Unknown primary source  -I would be happy to help him further with pain mgt, but he will need to sign a CSA with me to continue receiving them here. He will discuss with Dr. Myrle Sheng regarding ongoing follow up and rx of his narcotics.  - I wrote him another rx for his MS contin, he may take it up to 3  X per day for pain control. As it stands, he's only using it once or twice per day. Only #60 were rx'ed.   -continue XRT per rad onc team.  2. I will see him back as needed. 15 minutes of face to face patient care time were spent during this visit. All questions were encouraged and answered.

## 2012-05-21 ENCOUNTER — Ambulatory Visit: Payer: 59

## 2012-05-21 ENCOUNTER — Ambulatory Visit
Admission: RE | Admit: 2012-05-21 | Discharge: 2012-05-21 | Disposition: A | Discharge status: Home / Self Care | Payer: 59 | Source: Ambulatory Visit | Attending: Radiation Oncology | Admitting: Radiation Oncology

## 2012-05-22 ENCOUNTER — Ambulatory Visit: Payer: 59

## 2012-05-22 ENCOUNTER — Encounter (HOSPITAL_COMMUNITY)
Admission: RE | Admit: 2012-05-22 | Discharge: 2012-05-22 | Disposition: A | Discharge status: Home / Self Care | Payer: 59 | Source: Ambulatory Visit | Attending: Oncology | Admitting: Oncology

## 2012-05-22 DIAGNOSIS — C801 Malignant (primary) neoplasm, unspecified: Secondary | ICD-10-CM | POA: Insufficient documentation

## 2012-05-22 MED ORDER — FLUDEOXYGLUCOSE F - 18 (FDG) INJECTION
18.6000 | Freq: Once | INTRAVENOUS | Status: AC | PRN
  Administered 2012-05-22: 18.6 via INTRAVENOUS

## 2012-05-23 ENCOUNTER — Ambulatory Visit: Payer: 59

## 2012-05-26 ENCOUNTER — Ambulatory Visit: Payer: 59

## 2012-05-26 ENCOUNTER — Ambulatory Visit
Admission: RE | Admit: 2012-05-26 | Discharge: 2012-05-26 | Disposition: A | Discharge status: Home / Self Care | Payer: 59 | Source: Ambulatory Visit | Attending: Radiation Oncology | Admitting: Radiation Oncology

## 2012-05-27 ENCOUNTER — Ambulatory Visit: Payer: 59

## 2012-05-27 ENCOUNTER — Ambulatory Visit
Admission: RE | Admit: 2012-05-27 | Discharge: 2012-05-27 | Disposition: A | Discharge status: Home / Self Care | Payer: 59 | Source: Ambulatory Visit | Attending: Radiation Oncology | Admitting: Radiation Oncology

## 2012-05-28 ENCOUNTER — Ambulatory Visit: Payer: 59

## 2012-05-29 ENCOUNTER — Ambulatory Visit: Payer: 59

## 2012-05-30 ENCOUNTER — Ambulatory Visit: Payer: 59

## 2012-05-31 ENCOUNTER — Other Ambulatory Visit: Payer: Self-pay

## 2012-06-02 ENCOUNTER — Ambulatory Visit
Admission: RE | Admit: 2012-06-02 | Discharge: 2012-06-02 | Disposition: A | Discharge status: Home / Self Care | Payer: 59 | Source: Ambulatory Visit | Attending: Radiation Oncology | Admitting: Radiation Oncology

## 2012-06-02 ENCOUNTER — Ambulatory Visit: Payer: 59

## 2012-06-03 ENCOUNTER — Ambulatory Visit
Admission: RE | Admit: 2012-06-03 | Discharge: 2012-06-03 | Disposition: A | Discharge status: Home / Self Care | Payer: 59 | Source: Ambulatory Visit | Attending: Radiation Oncology | Admitting: Radiation Oncology

## 2012-06-03 ENCOUNTER — Ambulatory Visit: Payer: 59

## 2012-06-03 VITALS — BP 104/60 | HR 92

## 2012-06-03 DIAGNOSIS — C7951 Secondary malignant neoplasm of bone: Secondary | ICD-10-CM

## 2012-06-03 MED ORDER — MORPHINE SULFATE 4 MG/ML IJ SOLN
4.0000 mg | Freq: Once | INTRAMUSCULAR | Status: AC
  Administered 2012-06-03: 4 mg via INTRAMUSCULAR
  Filled 2012-06-03: qty 1

## 2012-06-03 NOTE — Progress Notes (Signed)
Justin Henson given a injection of Morphine 4 mg Im in right deltoid prior to treatment. He is unable to quantify pain at this time, but states while sitting it is not as bad as it was on yesterday. He is up to a W/C and is holding his head. He revealed that he did not take any of his short-acting pain medication at all today because he did not know how he would rect after receivng the injection.  Explained that it is okay to take as ordered and since he is concerned about overlap he can take his short-acting at least 2-3 hours prior to treatment on tomorrow in case he requires additional medication via an injection on tomorrow.  This information was relayed to his son. Justin Henson states he may not take the injection since it burned "so much".  "I am tired of pain".

## 2012-06-03 NOTE — Progress Notes (Signed)
[facility]    Radiation Oncology [address] [place]     Maryln Gottron, M.D. [place], [place] [address]               Billie Lade, M.D., Ph.D. Phone: [phone]      Molli Hazard A. Kathrynn Running, M.D. Fax: [phone]      Radene Gunning, M.D., Ph.D.         Lurline Hare, M.D.         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  Current Dose: 27 Gy  Current Fraction: 9/10  Planned Dose: 30 Gy  Narrative: Justin Henson was seen today for weekly treatment management. The chart was checked and port films  were reviewed. His pain in the right proximal leg is improving with this palliative radiation therapy. He however has severe pain when lying on the treatment table. The patient will be given a 4 mg injection of morphine prior to his treatment today for this issue.  Review of patient's allergies indicates no known allergies.  Current Outpatient Prescriptions  Medication Sig Dispense Refill  . ALPRAZolam (XANAX) 0.5 MG tablet Take 1 tablet (0.5 mg total) by mouth 3 (three) times daily as needed for anxiety.  30 tablet  0  . methocarbamol (ROBAXIN) 500 MG tablet Take 1 tablet (500 mg total) by mouth every 6 (six) hours as needed.  60 tablet  0  . morphine (MS CONTIN) 30 MG 12 hr tablet Take 1 tablet (30 mg total) by mouth 2 (two) times daily.  60 tablet  0  . morphine (MSIR) 15 MG tablet Take 1 tablet (15 mg total) by mouth every 4 (four) hours as needed for pain.  60 tablet  0  . senna-docusate (SENOKOT-S) 8.6-50 MG per tablet Take 2 tablets by mouth 2 (two) times daily.       Current Facility-Administered Medications  Medication Dose Route Frequency Provider Last Rate Last Dose  .  morphine 4 MG/ML injection 4 mg  4 mg Intramuscular Once Billie Lade, MD      Physical Examination:  blood pressure is 104/60 and his pulse is 92.    Wt Readings from Last 3 Encounters:  05/20/12 220 lb (99.791 kg)  05/09/12 217 lb 6.4 oz (98.612 kg)  05/05/12 237 lb (107.502 kg)     Lungs - Normal respiratory effort, chest expands symmetrically. Lungs are clear to auscultation, no crackles or wheezes.  Heart has regular rhythm and rate  Abdomen is soft and non tender with normal bowel sounds  Assessment:  Patient tolerating treatments well  Plan: Continue treatment per original radiation prescription

## 2012-06-03 NOTE — Addendum Note (Signed)
Encounter addended by: Delynn Flavin, RN on: 06/03/2012  4:41 PM<BR>     Documentation filed: Clinical Notes, Inpatient MAR

## 2012-06-04 ENCOUNTER — Telehealth: Payer: Self-pay | Admitting: Oncology

## 2012-06-04 ENCOUNTER — Ambulatory Visit
Admission: RE | Admit: 2012-06-04 | Discharge: 2012-06-04 | Disposition: A | Discharge status: Home / Self Care | Payer: 59 | Source: Ambulatory Visit | Attending: Radiation Oncology | Admitting: Radiation Oncology

## 2012-06-04 ENCOUNTER — Ambulatory Visit (HOSPITAL_BASED_OUTPATIENT_CLINIC_OR_DEPARTMENT_OTHER): Payer: 59 | Admitting: Oncology

## 2012-06-04 VITALS — BP 115/69 | HR 111 | Temp 97.0°F | Resp 20 | Ht 75.0 in | Wt 201.9 lb

## 2012-06-04 DIAGNOSIS — C7951 Secondary malignant neoplasm of bone: Secondary | ICD-10-CM

## 2012-06-04 DIAGNOSIS — D490 Neoplasm of unspecified behavior of digestive system: Secondary | ICD-10-CM

## 2012-06-04 DIAGNOSIS — C801 Malignant (primary) neoplasm, unspecified: Secondary | ICD-10-CM

## 2012-06-04 DIAGNOSIS — R911 Solitary pulmonary nodule: Secondary | ICD-10-CM

## 2012-06-04 MED ORDER — METHOCARBAMOL 500 MG PO TABS: 500.0000 mg | ORAL_TABLET | Freq: Four times a day (QID) | ORAL | Status: DC | PRN

## 2012-06-04 NOTE — Progress Notes (Signed)
    [facility]    OFFICE PROGRESS NOTE   INTERVAL HISTORY:   He returns as scheduled. He will complete radiation today. He reports significant improvement in the right hip/leg pain. He only takes 1 MSIR or oxycodone tablet daily. Justin Henson is ambulating with a walker. No other complaint. His appetite has improved.  Objective:  Vital signs in last 24 hours:  Blood pressure 115/69, pulse 111, temperature 97 F (36.1 C), resp. rate 20, height 6\' 3"  (1.905 m), weight 201 lb 14.4 oz (91.581 kg).    HEENT: Neck without mass Lymphatics: No cervical, supraventricular, axillary, or inguinal nodes Resp: Lungs clear bilaterally Cardio: Regular rate and rhythm GI: No hepatomegaly Vascular: No leg edema  Skin: Healed incisions at the right thigh    Lab Results:  Lab Results  Component Value Date   WBC 8.3 05/06/2012   HGB 10.3* 05/06/2012   HCT 33.0* 05/06/2012   MCV 71.9* 05/06/2012   PLT 390 05/06/2012   X-rays: PET scan 05/22/2012-2 suspicious left liver lesions, hypermetabolic soft tissue mass in the proximal transverse colon corresponding to the known adenoma. No hypermetabolic lymph nodes in the abdomen or pelvis. Hypermetabolic struck her right proximal femur lesion, multiple additional hypermetabolic bone metastases including the right skull, left sternal, multiple ribs, multiple vertebral metastases and multiple pelvic metastases.   Medications: I have reviewed the patient's current medications.  Assessment/Plan: 1.Destructive lesion in the proximal right femur, status post intramedullary rodding of the right femur 04/23/2011, the pathology confirmed metastatic carcinoma. The immunohistochemical profile is not consistent with colon cancer.  - Staging CT evaluation confirming a transverse colon mass, multiple small lung nodules, a lytic lesion at the sternum, and an expansile lesion at the proximal right femur  -Staging PET scan 05/22/2012 with 2 hypermetabolic  left liver lesions and multiple bone metastases. Hypermetabolic transverse colon lesion. No apparent primary tumor site. 2. Red cell microcytosis -we will recommend he began iron therapy  3. Pain secondary to the right femur lesion -improved 4. 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 Upper endoscopy 04/30/2012 with no evidence of malignancy  6. Syncope event 05/05/2012-negative noncontrast brain CT 05/05/2012   Disposition:  He has metastatic carcinoma of unknown primary. An extensive staging evaluation has not identified a primary tumor site. He will complete palliative radiation today.  I discussed the PET scan with Mr. Saunders and his wife. I will ask radiology to perform a needle biopsy of one of the liver or bone lesions. Hopefully this will yield diagnostic tissue for an unknown primary gene expression assay.  Mr. Pfluger will continue MSIR and oxycodone as needed for pain. He will return for an office visit in 2 weeks. We will discuss systemic chemotherapy options based on the unknown primary assay.  He has lost a significant amount of weight over the past few weeks, but he reports a good appetite at present.   Thornton Papas, MD  06/04/2012  11:30 AM

## 2012-06-05 ENCOUNTER — Other Ambulatory Visit: Payer: Self-pay | Admitting: Radiology

## 2012-06-09 ENCOUNTER — Encounter (HOSPITAL_COMMUNITY): Payer: Self-pay | Admitting: Pharmacy Technician

## 2012-06-09 ENCOUNTER — Encounter: Payer: Self-pay | Admitting: Oncology

## 2012-06-09 ENCOUNTER — Telehealth: Payer: Self-pay | Admitting: *Deleted

## 2012-06-09 NOTE — Telephone Encounter (Signed)
Called patient who describes pain after meals that "feels like someone's stuck a fist in my stomach.  It's right in the middle of my stomach.  Last about ninety minutes and eventually goes away.  I burp sometimes.  I've tried kaopectate and Pepto-bis mal and they don't touch it.  Can I have something called to [facility] on [address] to help with this.  My mom uses to talk of Paregoric.  Is this still available?"  LBM was yesterday morning and is emptying well.  Denies n/v.  Will notify providers.  Patient can be reached at [phone].

## 2012-06-10 ENCOUNTER — Ambulatory Visit (HOSPITAL_COMMUNITY)
Admission: RE | Admit: 2012-06-10 | Discharge: 2012-06-10 | Disposition: A | Discharge status: Home / Self Care | Payer: 59 | Source: Ambulatory Visit | Attending: Oncology | Admitting: Oncology

## 2012-06-10 ENCOUNTER — Encounter (HOSPITAL_COMMUNITY): Payer: Self-pay

## 2012-06-10 ENCOUNTER — Telehealth: Payer: Self-pay | Admitting: *Deleted

## 2012-06-10 DIAGNOSIS — Z79899 Other long term (current) drug therapy: Secondary | ICD-10-CM | POA: Insufficient documentation

## 2012-06-10 DIAGNOSIS — C801 Malignant (primary) neoplasm, unspecified: Secondary | ICD-10-CM

## 2012-06-10 DIAGNOSIS — C7951 Secondary malignant neoplasm of bone: Secondary | ICD-10-CM | POA: Insufficient documentation

## 2012-06-10 DIAGNOSIS — Z923 Personal history of irradiation: Secondary | ICD-10-CM | POA: Insufficient documentation

## 2012-06-10 HISTORY — DX: Reserved for concepts with insufficient information to code with codable children: IMO0002

## 2012-06-10 HISTORY — DX: Reserved for inherently not codable concepts without codable children: IMO0001

## 2012-06-10 HISTORY — DX: Hepatomegaly, not elsewhere classified: R16.0

## 2012-06-10 LAB — CBC
HCT: 43 % (ref 39.0–52.0)
MCH: 22.5 pg — ABNORMAL LOW (ref 26.0–34.0)
MCV: 70.7 fL — ABNORMAL LOW (ref 78.0–100.0)
Platelets: 264 10*3/uL (ref 150–400)
RDW: 18.6 % — ABNORMAL HIGH (ref 11.5–15.5)

## 2012-06-10 MED ORDER — FENTANYL CITRATE 0.05 MG/ML IJ SOLN
INTRAMUSCULAR | Status: AC | PRN
  Administered 2012-06-10: 50 ug via INTRAVENOUS
  Administered 2012-06-10: 100 ug via INTRAVENOUS
  Administered 2012-06-10: 50 ug via INTRAVENOUS

## 2012-06-10 MED ORDER — SODIUM CHLORIDE 0.9 % IV SOLN
INTRAVENOUS | Status: DC
  Administered 2012-06-10: 09:00:00 via INTRAVENOUS

## 2012-06-10 MED ORDER — MIDAZOLAM HCL 2 MG/2ML IJ SOLN
INTRAMUSCULAR | Status: AC
  Filled 2012-06-10: qty 6

## 2012-06-10 MED ORDER — MIDAZOLAM HCL 2 MG/2ML IJ SOLN
INTRAMUSCULAR | Status: AC | PRN
  Administered 2012-06-10 (×2): 1 mg via INTRAVENOUS

## 2012-06-10 MED ORDER — FENTANYL CITRATE 0.05 MG/ML IJ SOLN
INTRAMUSCULAR | Status: AC
  Filled 2012-06-10: qty 6

## 2012-06-10 NOTE — H&P (Signed)
Justin Henson is an 67 y.o. male.   Chief Complaint: "I'm here for a biopsy" HPI: Patient with history of metastatic carcinoma of unknown primary presents today for CT guided biopsy of a right iliac crest lesion.  Past Medical History  Diagnosis Date  . Cancer 04/24/2012    lesions currently under diagnosis  . Shortness of breath     on exertion  . Arthritis   . Syncope   . Hypotension   . Radiation     completed treatment  . Liver mass     Past Surgical History  Procedure Laterality Date  . Femur im nail  04/22/2012    Procedure: INTRAMEDULLARY (IM) NAIL FEMORAL;  Surgeon: Harvie Junior, MD;  Location: [facility] OR;  Service: Orthopedics;  Laterality: Right;  PROPHYLACTIC  IM ROD RIGHT FEMUR   . Colonoscopy  04/25/2012    Procedure: COLONOSCOPY;  Surgeon: Petra Kuba, MD;  Location: [facility] ENDOSCOPY;  Service: Endoscopy;  Laterality: N/A;  . Esophagogastroduodenoscopy  04/30/2012    Procedure: ESOPHAGOGASTRODUODENOSCOPY (EGD);  Surgeon: Petra Kuba, MD;  Location: [facility] ENDOSCOPY;  Service: Endoscopy;  Laterality: N/A;    No family history on file. Social History:  reports that he has never smoked. He has never used smokeless tobacco. He reports that he does not drink alcohol or use illicit drugs.  Allergies: No Known Allergies  Current outpatient prescriptions:ALPRAZolam (XANAX) 0.5 MG tablet, Take 0.5 mg by mouth 3 (three) times daily as needed for sleep or anxiety., Disp: , Rfl: ;  levofloxacin (LEVAQUIN) 500 MG tablet, Take 500 mg by mouth daily., Disp: , Rfl: ;  methocarbamol (ROBAXIN) 500 MG tablet, Take 500 mg by mouth every 6 (six) hours as needed (muscle spasms)., Disp: , Rfl:  morphine (MSIR) 15 MG tablet, Take 15 mg by mouth every 4 (four) hours as needed for pain., Disp: , Rfl: ;  oxyCODONE (OXY IR/ROXICODONE) 5 MG immediate release tablet, 10 mg every 6 (six) hours as needed for pain. , Disp: , Rfl: ;  senna-docusate (SENOKOT-S) 8.6-50 MG per tablet, Take 2 tablets by mouth 2 (two)  times daily., Disp: , Rfl: ;  Multiple Vitamin (MULTIVITAMIN WITH MINERALS) TABS, Take 1 tablet by mouth daily., Disp: , Rfl:  Current facility-administered medications:0.9 %  sodium chloride infusion, , Intravenous, Continuous, Brayton El, PA, Last Rate: 20 mL/hr at 06/10/12 9811   Results for orders placed during the hospital encounter of 06/10/12 (from the past 48 hour(s))  APTT     Status: None   Collection Time    06/10/12  9:04 AM      Result Value Range   aPTT 32  24 - 37 seconds  CBC     Status: Abnormal   Collection Time    06/10/12  9:04 AM      Result Value Range   WBC 8.3  4.0 - 10.5 K/uL   RBC 6.08 (*) 4.22 - 5.81 MIL/uL   Hemoglobin 13.7  13.0 - 17.0 g/dL   HCT 91.4  78.2 - 95.6 %   MCV 70.7 (*) 78.0 - 100.0 fL   MCH 22.5 (*) 26.0 - 34.0 pg   MCHC 31.9  30.0 - 36.0 g/dL   RDW 21.3 (*) 08.6 - 57.8 %   Platelets 264  150 - 400 K/uL  PROTIME-INR     Status: None   Collection Time    06/10/12  9:04 AM      Result Value Range   Prothrombin Time 13.1  11.6 - 15.2 seconds   INR 1.00  0.00 - 1.49   No results found.  Review of Systems  Constitutional: Negative for fever and chills.  Respiratory: Negative for cough and shortness of breath.   Cardiovascular: Negative for chest pain.  Gastrointestinal: Positive for abdominal pain. Negative for nausea and vomiting.  Musculoskeletal: Negative for back pain.       Rt leg pain  Neurological: Negative for headaches.  Endo/Heme/Allergies: Does not bruise/bleed easily.   Filed Vitals:   06/10/12 1008  BP: 123/78  Pulse: 79  Temp: 98.7 F (37.1 C)  Resp: 20  SpO2: 98%     Physical Exam  Constitutional: He is oriented to person, place, and time. He appears well-developed and well-nourished.  Cardiovascular: Normal rate and regular rhythm.   Respiratory: Effort normal and breath sounds normal.  GI: Soft. Bowel sounds are normal. There is no tenderness.  Musculoskeletal: He exhibits no edema.  Neurological: He is  alert and oriented to person, place, and time.     Assessment/Plan Pt with hx metastatic carcinoma of unknown primary. Plan is for CT guided biopsy of a right iliac crest lesion today. Details/risks of procedure d/w pt/wife with their understanding and consent.  ALLRED,D KEVIN 06/10/2012, 9:53 AM

## 2012-06-10 NOTE — H&P (Signed)
Agree with PA note.    Signed,  Heath K. McCullough, MD Vascular & Interventional Radiologist  Radiology  

## 2012-06-10 NOTE — Telephone Encounter (Signed)
[name] returned call, he is no longer having this abdominal pain. Appetite has been improving. Had biopsy today, went well.

## 2012-06-10 NOTE — Procedures (Signed)
Interventional Radiology Procedure Note  Procedure: FNA and core biopsy of lytic lesion right iliac wing Complications: None Recommendations: - Bedrest x 1 hr - Pathology pending  Signed,  Sterling Big, MD Vascular & Interventional Radiologist [facility] Radiology

## 2012-06-10 NOTE — Telephone Encounter (Signed)
Per Dr. Truett Perna: Have pt try antacid to see if this relieves pain. Paregoric wouldn't help-- this is for diarrhea. May need to see Dr. Ewing Schlein if this persists.

## 2012-06-16 NOTE — Progress Notes (Signed)
  Radiation Oncology         [phone] ________________________________  Name: Justin Henson MRN: [id number]  Date: 07/02/2012  DOB: Oct 12, 1945  End of Treatment Note  Diagnosis:   Metastatic cancer with osseous metastasis     Indication for treatment:  Palliative       Radiation treatment dates:   05/15/2012 through 06/04/2012  Site/dose:   The patient was treated to the right femur postoperatively to a dose of 30 gray in 10 fractions. This was accomplished with AP and PA fields.  Narrative: The patient tolerated radiation treatment relatively well.   The patient did not exhibit any significant issues in terms of acute toxicity during his course of palliative radiotherapy.  Plan: The patient has completed radiation treatment. The patient will return to radiation oncology clinic for routine followup in one month. I advised the patient to call or return sooner if they have any questions or concerns related to their recovery or treatment. ________________________________  Radene Gunning, M.D., Ph.D.

## 2012-06-17 ENCOUNTER — Telehealth: Payer: Self-pay

## 2012-06-17 NOTE — Telephone Encounter (Signed)
Enrique Sack with [facility] called and wanted an extension on patients care.  Verbal order given to continue care.

## 2012-06-20 ENCOUNTER — Telehealth: Payer: Self-pay | Admitting: *Deleted

## 2012-06-20 ENCOUNTER — Ambulatory Visit: Payer: 59 | Admitting: Oncology

## 2012-06-20 NOTE — Telephone Encounter (Signed)
CALLED PATIENT TO ALTER FU VISIT FOR 07-03-12 DUE TO DR. MOODY BEING ON VACATION, RESCHEDULED FOR 07-10-12 AT 3:00 PM, SPOKE WITH PATIENT AND HE IS AWARE OF THE  CHANGE AND O.K. WITH IT.

## 2012-06-24 ENCOUNTER — Telehealth: Payer: Self-pay | Admitting: *Deleted

## 2012-06-24 NOTE — Telephone Encounter (Signed)
Message from pt's wife requesting biopsy results, and to reschedule appt missed due to weather. Per Dr. Truett Perna: not resulted yet, sent out for additional testing. Appt to be rescheduled to 3/17 at 4:15. Called pt with this information. He voiced frustration at not having a diagnosis yet. Confirmed appt time.

## 2012-06-30 ENCOUNTER — Ambulatory Visit (HOSPITAL_BASED_OUTPATIENT_CLINIC_OR_DEPARTMENT_OTHER): Payer: 59 | Admitting: Oncology

## 2012-06-30 VITALS — BP 101/63 | HR 93 | Temp 97.6°F | Resp 18 | Ht 75.0 in

## 2012-06-30 DIAGNOSIS — C7951 Secondary malignant neoplasm of bone: Secondary | ICD-10-CM

## 2012-06-30 DIAGNOSIS — R42 Dizziness and giddiness: Secondary | ICD-10-CM

## 2012-06-30 DIAGNOSIS — R718 Other abnormality of red blood cells: Secondary | ICD-10-CM

## 2012-06-30 DIAGNOSIS — C801 Malignant (primary) neoplasm, unspecified: Secondary | ICD-10-CM

## 2012-06-30 MED ORDER — ONDANSETRON HCL 8 MG PO TABS
8.0000 mg | ORAL_TABLET | Freq: Once | ORAL | Status: AC
  Administered 2012-06-30: 8 mg via ORAL

## 2012-06-30 MED ORDER — PROMETHAZINE HCL 12.5 MG PO TABS: 12.5000 mg | ORAL_TABLET | Freq: Four times a day (QID) | ORAL | Status: DC | PRN

## 2012-06-30 NOTE — Progress Notes (Signed)
Patient presents to office in W/C holding wet cloth to his head and emesis basin in his lap. Reporting extreme dizziness and pain in right side of head and ear.

## 2012-06-30 NOTE — Progress Notes (Signed)
[facility]    OFFICE PROGRESS NOTE   INTERVAL HISTORY:   He returns as scheduled. He underwent a biopsy of a lytic right iliac lesion on 06/10/2012. He reports tolerating the procedure well. The biopsy confirmed metastatic carcinoma. Tissue has been submitted for an unknown primary gene array assay and the result is pending.  [name] has noted resolution of the right hip discomfort. He reports pain at the right occiput radiating around the upper aspect of the right ear toward the right temple area. This began approximately 2 weeks ago. He reports being evaluated at [facility] medicine and prescribed an antibiotic for a "ear infection ". The symptoms initially improved, but he developed more severe pain on 06/28/2012. He was seen at the [facility] urgent care and prescribed gabapentin. He was given a dose of Toradol. These medications did not help the pain.  He became "dizzy "when he arrived to the office today. The pain persist. No vomiting. No vertigo. He is ambulating in the home and remains non-weightbearing on the right leg .  Objective:  Vital signs in last 24 hours:  Blood pressure 101/63, pulse 93, temperature 97.6 F (36.4 C), temperature source Oral, resp. rate 18, height 6\' 3"  (1.905 m).    HEENT: Both external canals and tympanic membranes appear clear. Mild tenderness at the right occiput. No sinus tenderness. Pharynx without erythema. Mild discomfort with neck extension Lymphatics: No cervical, supraclavicular, axillary, or inguinal nodes Resp: Lungs clear bilaterally Cardio: Regular rate and rhythm GI: No hepatosplenomegaly Vascular: No leg edema Neuro: Alert and oriented. The face is symmetric. The extraocular movements are intact. Finger to nose testing is normal. He is able to ambulate to the examination table without assistance.  Skin: Healed incisions at the right thigh. No rash over the right neck, face, or scalp   Lab Results:  Lab Results    Component Value Date   WBC 8.3 06/10/2012   HGB 13.7 06/10/2012   HCT 43.0 06/10/2012   MCV 70.7* 06/10/2012   PLT 264 06/10/2012      Medications: I have reviewed the patient's current medications.  Assessment/Plan: 1.Destructive lesion in the proximal right femur, status post intramedullary rodding of the right femur 04/23/2011, the pathology confirmed metastatic carcinoma. The immunohistochemical profile is not consistent with colon cancer.  - Staging CT evaluation confirming a transverse colon mass, multiple small lung nodules, a lytic lesion at the sternum, and an expansile lesion at the proximal right femur  -Staging PET scan 05/22/2012 with 2 hypermetabolic left liver lesions and multiple bone metastases. Hypermetabolic transverse colon lesion.     No apparent primary tumor site.  -Right iliac biopsy 06/10/2012 confirming metastatic carcinoma-unknown primary gene array assay is pending 2. Red cell microcytosis -we will recommend he began iron therapy  3. Pain secondary to the right femur lesion -improved  4. 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 Upper endoscopy 04/30/2012 with no evidence of malignancy  6. Syncope event 05/05/2012-negative noncontrast brain CT 05/05/2012  7. Pain at the right side of the skull and right year with associated "dizziness "-persistent over the past 2 weeks. No rash to suggest a zoster infection.    Disposition:  We are waiting on the results from an unknown primary assay to decide on systemic therapy. He presents today with pain at the right side of the head. There is no rash to suggest a  zoster infection. I doubt the pain is related 20 or infection. I am concerned he has developed a skull or intracranial metastasis. He underwent a negative CT of the brain 05/05/2012. He will be referred for an MRI of the brain within the next few  days.   [name] had mild nausea in the office today. He was prescribed Phenergan to use as needed for nausea. He will return for an office visit on 06/10/2012.   Thornton Papas, MD  06/30/2012  5:53 PM

## 2012-07-01 ENCOUNTER — Other Ambulatory Visit: Payer: Self-pay | Admitting: *Deleted

## 2012-07-01 ENCOUNTER — Telehealth: Payer: Self-pay | Admitting: Oncology

## 2012-07-01 DIAGNOSIS — C801 Malignant (primary) neoplasm, unspecified: Secondary | ICD-10-CM

## 2012-07-01 NOTE — Telephone Encounter (Signed)
S/w wife re appt for 3/20 and 3/25.

## 2012-07-02 ENCOUNTER — Telehealth: Payer: Self-pay | Admitting: *Deleted

## 2012-07-02 ENCOUNTER — Encounter: Payer: Self-pay | Admitting: Radiation Oncology

## 2012-07-02 NOTE — Telephone Encounter (Signed)
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 Will discuss treatment plan next week. Will call her about MRI results tomorrow.

## 2012-07-03 ENCOUNTER — Other Ambulatory Visit (HOSPITAL_BASED_OUTPATIENT_CLINIC_OR_DEPARTMENT_OTHER): Payer: 59 | Admitting: Lab

## 2012-07-03 ENCOUNTER — Ambulatory Visit: Payer: 59 | Admitting: Radiation Oncology

## 2012-07-03 ENCOUNTER — Ambulatory Visit (HOSPITAL_COMMUNITY)
Admission: RE | Admit: 2012-07-03 | Discharge: 2012-07-03 | Disposition: A | Discharge status: Home / Self Care | Payer: 59 | Source: Ambulatory Visit | Attending: Oncology | Admitting: Oncology

## 2012-07-03 DIAGNOSIS — G930 Cerebral cysts: Secondary | ICD-10-CM | POA: Insufficient documentation

## 2012-07-03 DIAGNOSIS — C801 Malignant (primary) neoplasm, unspecified: Secondary | ICD-10-CM

## 2012-07-03 DIAGNOSIS — R51 Headache: Secondary | ICD-10-CM | POA: Insufficient documentation

## 2012-07-03 LAB — BASIC METABOLIC PANEL (CC13)
BUN: 10.1 mg/dL (ref 7.0–26.0)
Calcium: 10.1 mg/dL (ref 8.4–10.4)
Glucose: 104 mg/dl — ABNORMAL HIGH (ref 70–99)
Sodium: 140 mEq/L (ref 136–145)

## 2012-07-03 MED ORDER — GADOBENATE DIMEGLUMINE 529 MG/ML IV SOLN
20.0000 mL | Freq: Once | INTRAVENOUS | Status: AC | PRN
  Administered 2012-07-03: 19 mL via INTRAVENOUS

## 2012-07-04 ENCOUNTER — Telehealth: Payer: Self-pay | Admitting: *Deleted

## 2012-07-04 NOTE — Telephone Encounter (Signed)
Message copied by Wandalee Ferdinand on Fri Jul 04, 2012  3:06 PM ------      Message from: Thornton Papas B      Created: Thu Jul 03, 2012  8:39 PM       Please call patient, mri is negative for cancer, f/u as scheduled.?pain related to zoster infection-even though he has no rash ------

## 2012-07-04 NOTE — Telephone Encounter (Signed)
Notified son that MRI was negative for cancer. He reports headache is better today. Wife was at work-he will let her know.

## 2012-07-08 ENCOUNTER — Other Ambulatory Visit: Payer: Self-pay | Admitting: *Deleted

## 2012-07-08 ENCOUNTER — Encounter: Payer: Self-pay | Admitting: Radiation Oncology

## 2012-07-08 ENCOUNTER — Encounter: Payer: Self-pay | Admitting: *Deleted

## 2012-07-08 ENCOUNTER — Telehealth: Payer: Self-pay | Admitting: Oncology

## 2012-07-08 ENCOUNTER — Other Ambulatory Visit: Payer: 59

## 2012-07-08 ENCOUNTER — Ambulatory Visit (HOSPITAL_BASED_OUTPATIENT_CLINIC_OR_DEPARTMENT_OTHER): Payer: 59 | Admitting: Oncology

## 2012-07-08 ENCOUNTER — Telehealth: Payer: Self-pay | Admitting: Dietician

## 2012-07-08 ENCOUNTER — Telehealth: Payer: Self-pay | Admitting: *Deleted

## 2012-07-08 VITALS — BP 111/79 | HR 100 | Temp 97.7°F | Resp 18 | Ht 75.0 in | Wt 188.6 lb

## 2012-07-08 DIAGNOSIS — C7952 Secondary malignant neoplasm of bone marrow: Secondary | ICD-10-CM

## 2012-07-08 DIAGNOSIS — C801 Malignant (primary) neoplasm, unspecified: Secondary | ICD-10-CM

## 2012-07-08 DIAGNOSIS — F411 Generalized anxiety disorder: Secondary | ICD-10-CM

## 2012-07-08 DIAGNOSIS — C7951 Secondary malignant neoplasm of bone: Secondary | ICD-10-CM

## 2012-07-08 MED ORDER — OXYCODONE HCL 5 MG PO TABS: 10.0000 mg | ORAL_TABLET | Freq: Four times a day (QID) | ORAL | Status: DC | PRN

## 2012-07-08 NOTE — Telephone Encounter (Signed)
Brief Outpatient Oncology Nutrition Note  Patient has been identified to be at risk on malnutrition screen.   Wt Readings from Last 10 Encounters:  07/08/12 188 lb 9.6 oz (85.548 kg)  06/04/12 201 lb 14.4 oz (91.581 kg)  05/20/12 220 lb (99.791 kg)  05/09/12 217 lb 6.4 oz (98.612 kg)  05/05/12 237 lb (107.502 kg)  04/21/12 237 lb (107.502 kg)  04/21/12 237 lb (107.502 kg)  04/21/12 237 lb (107.502 kg)    Spoke with patient's wife who stated that patient is not eating well at all.  Weight loss of 30 lbs in the last 2 months.  Patient with Shingles and also problems with decreased taste and increased smell.  Ensure made him vomit. Wife receptive to [facility].  Will arrange.    Oran Rein, RD, LDN

## 2012-07-08 NOTE — Telephone Encounter (Signed)
Per staff phone call and POF I have scheduled appts. I have notified the scheduler that appts need to be earlier in the day due to treatment length.  JMW

## 2012-07-08 NOTE — Progress Notes (Signed)
[facility]    OFFICE PROGRESS NOTE   INTERVAL HISTORY:   He returns as scheduled. The brain MRI on 07/03/2012 revealed no acute abnormality. No evidence of metastatic disease.  The right-sided had pain has improved. He has malaise and anorexia. He stays in the bed or a chair the majority of the day. He relates the inability to be nonweightbearing status on the right leg. The right leg pain has resolved. 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  Objective:  Vital signs in last 24 hours:  Blood pressure 111/79, pulse 100, temperature 97.7 F (36.5 C), temperature source Oral, resp. rate 18, height 6\' 3"  (1.905 m), weight 188 lb 9.6 oz (85.548 kg).    HEENT: no scalp tenderness, neck without mass Lymphatics: no cervical, supraclavicular, axillary, or inguinal nodes Resp: lungs clear bilaterally Cardio: regular rate and rhythm GI: no hepatomegaly, nontender, no mass Vascular: no leg edema Neuro:alert and oriented  Skin:no rash over the right head    Medications: I have reviewed the patient's current medications.  Assessment/Plan: 1.Destructive lesion in the proximal right femur, status post intramedullary rodding of the right femur 04/23/2011, the pathology confirmed metastatic carcinoma. The immunohistochemical profile is not consistent with colon cancer.  - Staging CT evaluation confirming a transverse colon mass, multiple small lung nodules, a lytic lesion at the sternum, and an expansile lesion at the proximal right femur  -Staging PET scan 05/22/2012 with 2 hypermetabolic left liver lesions and multiple bone metastases. Hypermetabolic transverse colon lesion. No apparent primary tumor site.  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 Red cell microcytosis -we  will recommend he began iron therapy  3. Pain secondary to the right femur lesion -improved  4. 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 Upper endoscopy 04/30/2012 with no evidence of malignancy  6. Syncope event 05/05/2012-negative noncontrast brain CT 05/05/2012  7. Pain at the right side of the skull and right year with associated "dizziness "-persistent over the past 2 weeks. No rash to suggest a zoster infection. Negative brain MRI. His symptoms have improved.  Disposition:  The right-sided head pain has improved. It is possible he had a zoster infection despite the absence of a rash.  I discussed the unknown primary assay with Justin Henson and his wife. He understands this is not a definitive test. He has an unknown primary carcinoma without a clear primary in the pancreas or biliary tract identified on an extensive staging evaluation.  He would like to proceed with a trial of systemic therapy. Based on the unknown primary assay and the lack of a clearly defined primary I recommend proceeding with gemcitabine-based therapy. We discussed single agent gemcitabine and gemcitabine/cisplatin. I recommend proceeding with gemcitabine/cisplatin to be given on a day 1, day 8 schedule.  He has a borderline performance status. The immobility is in part related to the right leg surgery.  Justin Henson is anxious to proceed with a trial of systemic therapy. He understands no therapy will be curative. It will be difficult to follow his response to therapy in the absence of easily measured disease. Serum tumor markers were normal at presentation.  We reviewed the potential toxicities associated with the gemcitabine/cisplatin regimen including the chance for nausea/vomiting, mucositis, alopecia, and hematologic toxicity.  We discussed the allergic reaction , fever,and pneumonitis associated  with gemcitabine. We reviewed the neuropathy, ototoxicity, and renal toxicity associated with cisplatin. He will attend a chemotherapy teaching class today. The plan is to begin with peripheral IV access. He understands peripheral IV administration can be difficult with gemcitabine.  The plan is to begin chemotherapy on 07/03/2012. He will return for an office visit and the second week of chemotherapy on 07/18/2012.   Justin Papas, MD  07/08/2012  9:12 PM

## 2012-07-10 ENCOUNTER — Ambulatory Visit
Admission: RE | Admit: 2012-07-10 | Discharge: 2012-07-10 | Disposition: A | Discharge status: Home / Self Care | Payer: 59 | Source: Ambulatory Visit | Attending: Radiation Oncology | Admitting: Radiation Oncology

## 2012-07-10 ENCOUNTER — Other Ambulatory Visit: Payer: Self-pay | Admitting: *Deleted

## 2012-07-10 VITALS — BP 122/82 | HR 118 | Temp 97.5°F | Wt 189.6 lb

## 2012-07-10 DIAGNOSIS — C801 Malignant (primary) neoplasm, unspecified: Secondary | ICD-10-CM

## 2012-07-10 DIAGNOSIS — C7952 Secondary malignant neoplasm of bone marrow: Secondary | ICD-10-CM

## 2012-07-10 NOTE — Progress Notes (Signed)
Radiation Oncology         [phone] ________________________________  Name: Justin Henson MRN: [id number]  Date: 07/10/2012  DOB: 11-19-1945  Follow-Up Visit Note  CC: Lolita Patella, MD  Ladene Artist, MD  Diagnosis:   Metastatic carcinoma with bony metastasis  Interval Since Last Radiation:  One month   Narrative:  The patient returns today for routine follow-up.  The patient's pain in the right leg continues to be much improved. He really denies any significant pain in this area. The patient states that he has some pain across the lower back but he feels that this is muscular and he states that this feels similar to muscle soreness which he has experienced in the past. He relates this to his activity level and he does not feel that this is related to bony disease. The patient did have a number of hypermetabolic areas of bony disease seen on PET scan and I discussed with him that we can follow his symptoms to see if any other areas required palliative radiotherapy at some point. The patient certainly responded well to his initial course of radiation. The patient's gene assay suggested possible pancreaticobiliary origin. He is beginning chemotherapy tomorrow. The patient had some pain in the right ear region and this has improved.              ALLERGIES:  has No Known Allergies.  Meds: Current Outpatient Prescriptions  Medication Sig Dispense Refill  . methocarbamol (ROBAXIN) 500 MG tablet Take 500 mg by mouth every 6 (six) hours as needed (muscle spasms).      . Multiple Vitamin (MULTIVITAMIN WITH MINERALS) TABS Take 1 tablet by mouth daily.      Marland Kitchen oxyCODONE (OXY IR/ROXICODONE) 5 MG immediate release tablet Take 2 tablets (10 mg total) by mouth every 6 (six) hours as needed for pain.  75 tablet  0  . senna-docusate (SENOKOT-S) 8.6-50 MG per tablet Take 2 tablets by mouth 2 (two) times daily.      Marland Kitchen ALPRAZolam (XANAX) 0.5 MG tablet Take 0.5 mg by mouth 3 (three) times  daily as needed for sleep or anxiety.      Marland Kitchen morphine (MSIR) 15 MG tablet Take 15 mg by mouth every 4 (four) hours as needed for pain.      . promethazine (PHENERGAN) 12.5 MG tablet Take 1 tablet (12.5 mg total) by mouth every 6 (six) hours as needed for nausea.  20 tablet  2   No current facility-administered medications for this encounter.    Physical Findings: The patient is in no acute distress. Patient is alert and oriented.  weight is 189 lb 9.6 oz (86.002 kg). His temperature is 97.5 F (36.4 C). His blood pressure is 122/82 and his pulse is 118. His oxygen saturation is 98%. .   General: Well-developed, in no acute distress, sitting in a wheelchair HEENT: Normocephalic, atraumatic Cardiovascular: Regular rate and rhythm Respiratory: Clear to auscultation bilaterally GI: Soft, nontender, normal bowel sounds Extremities: No edema present; the back and pelvic region was nontender to palpation   Lab Findings: Lab Results  Component Value Date   WBC 8.3 06/10/2012   HGB 13.7 06/10/2012   HCT 43.0 06/10/2012   MCV 70.7* 06/10/2012   PLT 264 06/10/2012     Radiographic Findings: Mr Laqueta Jean NW Contrast  07/03/2012  *RADIOLOGY REPORT*  Clinical Data: 67 year old male with dizziness and right side headache.  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  MRI HEAD  WITHOUT AND WITH CONTRAST  Technique:  Multiplanar, multiecho pulse sequences of the brain and surrounding structures were obtained according to standard protocol without and with intravenous contrast  Contrast: 19mL MULTIHANCE GADOBENATE DIMEGLUMINE 529 MG/ML IV SOLN  Comparison: Head CT without contrast 05/05/2012.  Findings: Small CSF isointense area along the anterior left frontal lobe compatible with arachnoid cyst is associated with mild scalloping of the inner table of the skull and measures 11 x 14 x 20 mm.  No associated enhancement.  No other extra-axial collection. No restricted diffusion  to suggest acute infarction.  No abnormal enhancement identified.  No midline shift.  No other intracranial mass lesion.  Degenerative changes at the tip of the odontoid.  Otherwise negative visualized cervicomedullary junction and cervical spine. Negative pituitary. Visualized bone marrow signal is within normal limits.  No ventriculomegaly. Major intracranial vascular flow voids are preserved, dominant distal right vertebral artery.  No cortical encephalomalacia.  Normal for age gray and white matter signal. No acute intracranial hemorrhage identified.  Visualized orbit soft tissues are within normal limits.  Minor posterior ethmoid sinus mucosal thickening on the right. Small volume fluid or mucosal thickening in the left sphenoid sinus. Grossly normal visualized internal auditory structures.  Small volume of right petrous apex fluid.  Mastoids are clear.  Negative scalp soft tissues.  Negative visualized nasopharynx.  IMPRESSION: 1. No acute or metastatic intracranial abnormality. Incidental small left anterior frontal convexity arachnoid cyst. 2.  Small volume right petrous air cell effusion.  Mild right ethmoid and left sphenoid sinus fluid or mucosal thickening.   Original Report Authenticated By: Erskine Speed, M.D.     Impression:    The patient did very well with his course of palliative radiotherapy to the right knee number. No pain in this area at this time. The patient is beginning chemotherapy tomorrow.  Plan:  I discussed possible followup options with him. He indicated that he wished to not schedule a definite followup with [place], but rather run his overall care through medical oncology as not to complicate things too much. He is well plugged in with them and I am fine with this plan. The patient may require palliative radiotherapy to additional areas in the future and I would be happy to see him if this is necessary.   Radene Gunning, M.D., Ph.D.

## 2012-07-10 NOTE — Progress Notes (Signed)
Patient here for routine one month follow up completion of radiation to right femur.No right groin pain but new back pain which he attributes to lying in bed after diagnosis of shingles in right ear.Takes oxy ir for pain.Diminshed appetite and taste.Generalized fatigue.

## 2012-07-11 ENCOUNTER — Other Ambulatory Visit: Payer: 59 | Admitting: Lab

## 2012-07-11 ENCOUNTER — Ambulatory Visit (HOSPITAL_BASED_OUTPATIENT_CLINIC_OR_DEPARTMENT_OTHER): Payer: 59

## 2012-07-11 ENCOUNTER — Other Ambulatory Visit: Payer: Self-pay | Admitting: Oncology

## 2012-07-11 ENCOUNTER — Ambulatory Visit: Payer: 59 | Admitting: Nutrition

## 2012-07-11 ENCOUNTER — Other Ambulatory Visit (HOSPITAL_BASED_OUTPATIENT_CLINIC_OR_DEPARTMENT_OTHER): Payer: 59 | Admitting: Lab

## 2012-07-11 DIAGNOSIS — C7951 Secondary malignant neoplasm of bone: Secondary | ICD-10-CM

## 2012-07-11 DIAGNOSIS — Z5111 Encounter for antineoplastic chemotherapy: Secondary | ICD-10-CM

## 2012-07-11 DIAGNOSIS — C7952 Secondary malignant neoplasm of bone marrow: Secondary | ICD-10-CM

## 2012-07-11 DIAGNOSIS — C801 Malignant (primary) neoplasm, unspecified: Secondary | ICD-10-CM

## 2012-07-11 LAB — CBC WITH DIFFERENTIAL/PLATELET
BASO%: 0.6 % (ref 0.0–2.0)
HCT: 45.8 % (ref 38.4–49.9)
MCHC: 32.3 g/dL (ref 32.0–36.0)
MONO#: 0.9 10*3/uL (ref 0.1–0.9)
RBC: 6.33 10*6/uL — ABNORMAL HIGH (ref 4.20–5.82)
WBC: 8.9 10*3/uL (ref 4.0–10.3)
lymph#: 0.9 10*3/uL (ref 0.9–3.3)
nRBC: 0 % (ref 0–0)

## 2012-07-11 MED ORDER — SODIUM CHLORIDE 0.9 % IV SOLN
150.0000 mg | Freq: Once | INTRAVENOUS | Status: AC
  Administered 2012-07-11: 150 mg via INTRAVENOUS
  Filled 2012-07-11: qty 5

## 2012-07-11 MED ORDER — SODIUM CHLORIDE 0.9 % IV SOLN
Freq: Once | INTRAVENOUS | Status: AC
  Administered 2012-07-11: 12:00:00 via INTRAVENOUS

## 2012-07-11 MED ORDER — PALONOSETRON HCL INJECTION 0.25 MG/5ML
0.2500 mg | Freq: Once | INTRAVENOUS | Status: AC
  Administered 2012-07-11: 0.25 mg via INTRAVENOUS

## 2012-07-11 MED ORDER — SODIUM CHLORIDE 0.9 % IV SOLN
50.0000 mg/m2 | Freq: Once | INTRAVENOUS | Status: AC
  Administered 2012-07-11: 107 mg via INTRAVENOUS
  Filled 2012-07-11: qty 107

## 2012-07-11 MED ORDER — SODIUM CHLORIDE 0.9 % IV SOLN
800.0000 mg/m2 | Freq: Once | INTRAVENOUS | Status: AC
  Administered 2012-07-11: 1710 mg via INTRAVENOUS
  Filled 2012-07-11: qty 45

## 2012-07-11 MED ORDER — DEXAMETHASONE SODIUM PHOSPHATE 4 MG/ML IJ SOLN
12.0000 mg | Freq: Once | INTRAMUSCULAR | Status: AC
  Administered 2012-07-11: 12 mg via INTRAVENOUS

## 2012-07-11 MED ORDER — POTASSIUM CHLORIDE 2 MEQ/ML IV SOLN
Freq: Once | INTRAVENOUS | Status: AC
  Administered 2012-07-11: 10:00:00 via INTRAVENOUS
  Filled 2012-07-11: qty 10

## 2012-07-11 NOTE — Patient Instructions (Addendum)
[facility] Discharge Instructions for Patients Receiving Chemotherapy  Today you received the following chemotherapy agents: Gemzar and Cisplatin.  To help prevent nausea and vomiting after your treatment, we encourage you to take your nausea medication.   If you develop nausea and vomiting that is not controlled by your nausea medication, call the clinic.    BELOW ARE SYMPTOMS THAT SHOULD BE REPORTED IMMEDIATELY:  *FEVER GREATER THAN 100.5 F  *CHILLS WITH OR WITHOUT FEVER  NAUSEA AND VOMITING THAT IS NOT CONTROLLED WITH YOUR NAUSEA MEDICATION  *UNUSUAL SHORTNESS OF BREATH  *UNUSUAL BRUISING OR BLEEDING  TENDERNESS IN MOUTH AND THROAT WITH OR WITHOUT PRESENCE OF ULCERS  *URINARY PROBLEMS  *BOWEL PROBLEMS  UNUSUAL RASH Items with * indicate a potential emergency and should be followed up as soon as possible.  One of the nurses will contact you on Monday. Please let the nurse know about any problems that you may have experienced. Feel free to call the clinic you have any questions or concerns. The clinic phone number is [phone].

## 2012-07-11 NOTE — Progress Notes (Signed)
Pt voided 300cc pre-Cisplatin.

## 2012-07-11 NOTE — Progress Notes (Signed)
Patient is a 67 year old male diagnosed with an unknown primary cancer. He is a patient of Dr. Truett Perna.  Past medical history includes hypertension.  Medications include Xanax, multivitamin, Phenergan, and Senokot S.  Labs include glucose of 104 on March 20.  Height: 6 feet 3 inches. Weight: 188.6 pounds March 25. Usual body weight 237 pounds January of 2014. BMI: 23.57.  Patient complains of severe taste alterations and anorexia during radiation therapy. He is 3 weeks out from treatment and states taste is improving slightly. States he can eat anything he wants however the taste decreases total intake. He did not care for ensure. He stated it caused vomiting. Patient also complaining of pain.  Nutrition diagnosis: Unintended weight loss related to poor appetite and inadequate oral intake as evidenced by 20% weight loss over the past 2 months.  Intervention: Patient was educated to increase calories and protein in small amounts of food throughout the day. Patient was educated that it is important to try to increase oral nutrition supplements to at least once daily. I've given him recipes and encouraged him to try milkshakes or Carnation breakfast essentials for improved tolerance. I have educated him on strategies for improving taste alterations. I provided fact sheets, recipes, and my contact information. Teach back method used.  Monitoring, evaluation, goals: Patient will tolerate increased calories and protein to minimize further weight loss.  Next visit: Friday, April 4, during chemotherapy.

## 2012-07-14 ENCOUNTER — Telehealth: Payer: Self-pay | Admitting: *Deleted

## 2012-07-14 ENCOUNTER — Encounter: Payer: Self-pay | Admitting: *Deleted

## 2012-07-14 NOTE — Telephone Encounter (Signed)
PT. HAD A PROBLEM WITH HICCOUGHS OVER THE WEEKEND. HE NOTIFIED THE ON CALL PHYSICIAN. EVENTUALLY THE HICCOUGHS STOPPED. PT. DOES NOT HAVE MUCH OF AN APPETITE. HE HAS MET WITH THE DIETICIAN, BARBARA NEFF. PT. HAS AN APPOINTMENT WITH HER AGAIN ON Friday. DISCUSSED CONSTIPATION ISSUES AND THE IMPORTANCE OF HAVING A GOOD BOWEL MOVEMENT EVERY THREE DAYS. PT.'S WIFE HAS THE CHEMOTHERAPY DRUG INFORMATION SHEETS FOR REFERENCE. NO OTHER PROBLEMS OR QUESTIONS AT THIS TIME. PT. WILL CALL THIS OFFICE OR THE ON CALL PHYSICIAN IF THE NEED ARISES.

## 2012-07-14 NOTE — Progress Notes (Signed)
Faxed copy of molecular diagnosis to dr Jonny Ruiz [facility]. [phone]

## 2012-07-16 ENCOUNTER — Other Ambulatory Visit: Payer: Self-pay | Admitting: Oncology

## 2012-07-18 ENCOUNTER — Telehealth: Payer: Self-pay | Admitting: Oncology

## 2012-07-18 ENCOUNTER — Ambulatory Visit (HOSPITAL_BASED_OUTPATIENT_CLINIC_OR_DEPARTMENT_OTHER): Payer: 59 | Admitting: Oncology

## 2012-07-18 ENCOUNTER — Other Ambulatory Visit: Payer: 59 | Admitting: Lab

## 2012-07-18 ENCOUNTER — Ambulatory Visit: Payer: 59 | Admitting: Nutrition

## 2012-07-18 ENCOUNTER — Ambulatory Visit: Payer: 59 | Admitting: Oncology

## 2012-07-18 ENCOUNTER — Ambulatory Visit (HOSPITAL_BASED_OUTPATIENT_CLINIC_OR_DEPARTMENT_OTHER): Payer: 59

## 2012-07-18 ENCOUNTER — Other Ambulatory Visit (HOSPITAL_BASED_OUTPATIENT_CLINIC_OR_DEPARTMENT_OTHER): Payer: 59

## 2012-07-18 VITALS — BP 108/70 | HR 116 | Temp 97.8°F | Resp 20 | Ht 75.0 in | Wt 182.9 lb

## 2012-07-18 DIAGNOSIS — Z5111 Encounter for antineoplastic chemotherapy: Secondary | ICD-10-CM

## 2012-07-18 DIAGNOSIS — C801 Malignant (primary) neoplasm, unspecified: Secondary | ICD-10-CM

## 2012-07-18 DIAGNOSIS — C7951 Secondary malignant neoplasm of bone: Secondary | ICD-10-CM

## 2012-07-18 DIAGNOSIS — R718 Other abnormality of red blood cells: Secondary | ICD-10-CM

## 2012-07-18 DIAGNOSIS — C7952 Secondary malignant neoplasm of bone marrow: Secondary | ICD-10-CM

## 2012-07-18 LAB — COMPREHENSIVE METABOLIC PANEL (CC13)
ALT: 74 U/L — ABNORMAL HIGH (ref 0–55)
BUN: 11.5 mg/dL (ref 7.0–26.0)
CO2: 29 mEq/L (ref 22–29)
Calcium: 10.3 mg/dL (ref 8.4–10.4)
Chloride: 99 mEq/L (ref 98–107)
Creatinine: 0.9 mg/dL (ref 0.7–1.3)
Glucose: 106 mg/dl — ABNORMAL HIGH (ref 70–99)

## 2012-07-18 LAB — CBC WITH DIFFERENTIAL/PLATELET
HCT: 45.4 % (ref 38.4–49.9)
HGB: 15.1 g/dL (ref 13.0–17.1)
MCH: 23.9 pg — ABNORMAL LOW (ref 27.2–33.4)
MCV: 71.8 fL — ABNORMAL LOW (ref 79.3–98.0)
Platelets: 242 10*3/uL (ref 140–400)
RDW: 19 % — ABNORMAL HIGH (ref 11.0–14.6)
WBC: 4 10*3/uL (ref 4.0–10.3)

## 2012-07-18 LAB — MANUAL DIFFERENTIAL
ANC (CHCC manual diff): 2.7 10*3/uL (ref 1.5–6.5)
Band Neutrophils: 0 % (ref 0–10)
Basophil: 4 % — ABNORMAL HIGH (ref 0–2)
Blasts: 0 % (ref 0–0)
PROMYELO: 0 % (ref 0–0)
SEG: 67 % (ref 38–77)
nRBC: 0 % (ref 0–0)

## 2012-07-18 LAB — MAGNESIUM (CC13): Magnesium: 2.4 mg/dl (ref 1.5–2.5)

## 2012-07-18 MED ORDER — PROCHLORPERAZINE MALEATE 10 MG PO TABS
10.0000 mg | ORAL_TABLET | Freq: Once | ORAL | Status: AC
  Administered 2012-07-18: 10 mg via ORAL

## 2012-07-18 MED ORDER — SODIUM CHLORIDE 0.9 % IV SOLN
800.0000 mg/m2 | Freq: Once | INTRAVENOUS | Status: AC
  Administered 2012-07-18: 1710 mg via INTRAVENOUS
  Filled 2012-07-18: qty 45

## 2012-07-18 MED ORDER — SODIUM CHLORIDE 0.9 % IV SOLN
Freq: Once | INTRAVENOUS | Status: AC
  Administered 2012-07-18: 12:00:00 via INTRAVENOUS

## 2012-07-18 NOTE — Progress Notes (Signed)
1145 am - Pt c/o extreme nausea after IV inserted - he attributed to pain.    1254 - NS hung with gemzar - runing NS 330 and Gemzar 330 mL/hr.     1315 - Pt c/o burning along vein 6/10.  Gemzar to 300 mL/hr, increased NS to 400 mL/hr.  Heat pack applied.   1322 - pt reports burning at vein 6/10.  Slowed Gemzar to 220 mL/hr.    1345 - Pulled IV.  Patient sat up quickly and reported "seeing spots".   1350 - BP 88/61, HR 96.  Laid pt back with feet up. 1405 - BP 105/73, HR 86.  Moved pt to sitting position with feet up.  1415 - transferred patient to wheelchair.  Pt reports "seeing spots - attributes problems to pain.  Son provided him with home pain medication. 1420 - BP 100/68.  Patient discharged in wheelchair with son.

## 2012-07-18 NOTE — Progress Notes (Signed)
I met with patient and wife in the chemotherapy area. Patient has not felt well for 3 days. He has had practically no food intake and very little fluid intake over the last 3 days. Patient's weight has declined to 182.9 pounds April 4 from 188.6 pounds March 25. Patient's wife reports she has been offering cold foods.  Patient has been taking nausea medication as prescribed. He is sensitive to food odors.  Nutrition diagnosis: Unintended weight loss continues.  Intervention: I've educated patient and wife once again on strategies for increasing oral intake with nausea. I've encouraged her to fix patient milkshakes or Carnation Instant Breakfast based on tolerance. I have enforced importance of adequate oral intake with patient.  Monitoring, evaluation, goals: Patient has been unable to tolerate increased oral intake and protein to minimize weight loss. He will work to add oral nutrition supplements to his daily regimen.  Next visit: Friday, April 18, during chemotherapy.

## 2012-07-18 NOTE — Patient Instructions (Addendum)
[facility] Discharge Instructions for Patients Receiving Chemotherapy  Today you received the following chemotherapy agents: gemzar   To help prevent nausea and vomiting after your treatment, we encourage you to take your nausea medication.  Take it as often as prescribed.     If you develop nausea and vomiting that is not controlled by your nausea medication, call the clinic. If it is after clinic hours your family physician or the after hours number for the clinic or go to the Emergency Department.   BELOW ARE SYMPTOMS THAT SHOULD BE REPORTED IMMEDIATELY:  *FEVER GREATER THAN 100.5 F  *CHILLS WITH OR WITHOUT FEVER  NAUSEA AND VOMITING THAT IS NOT CONTROLLED WITH YOUR NAUSEA MEDICATION  *UNUSUAL SHORTNESS OF BREATH  *UNUSUAL BRUISING OR BLEEDING  TENDERNESS IN MOUTH AND THROAT WITH OR WITHOUT PRESENCE OF ULCERS  *URINARY PROBLEMS  *BOWEL PROBLEMS  UNUSUAL RASH Items with * indicate a potential emergency and should be followed up as soon as possible.  Feel free to call the clinic you have any questions or concerns. The clinic phone number is [phone].   I have been informed and understand all the instructions given to me. I know to contact the clinic, my physician, or go to the Emergency Department if any problems should occur. I do not have any questions at this time, but understand that I may call the clinic during office hours   should I have any questions or need assistance in obtaining follow up care.    __________________________________________  _____________  __________ Signature of Patient or Authorized Representative            Date                   Time    __________________________________________ Nurse's Signature   Dehydration, Adult Dehydration is when you lose more fluids from the body than you take in. Vital organs like the kidneys, brain, and heart cannot function without a proper amount of fluids and salt. Any loss of fluids  from the body can cause dehydration.  CAUSES   Vomiting.  Diarrhea.  Excessive sweating.  Excessive urine output.  Fever. SYMPTOMS  Mild dehydration  Thirst.  Dry lips.  Slightly dry mouth. Moderate dehydration  Very dry mouth.  Sunken eyes.  Skin does not bounce back quickly when lightly pinched and released.  Dark urine and decreased urine production.  Decreased tear production.  Headache. Severe dehydration  Very dry mouth.  Extreme thirst.  Rapid, weak pulse (more than 100 beats per minute at rest).  Cold hands and feet.  Not able to sweat in spite of heat and temperature.  Rapid breathing.  Blue lips.  Confusion and lethargy.  Difficulty being awakened.  Minimal urine production.  No tears. DIAGNOSIS  Your caregiver will diagnose dehydration based on your symptoms and your exam. Blood and urine tests will help confirm the diagnosis. The diagnostic evaluation should also identify the cause of dehydration. TREATMENT  Treatment of mild or moderate dehydration can often be done at home by increasing the amount of fluids that you drink. It is best to drink small amounts of fluid more often. Drinking too much at one time can make vomiting worse. Refer to the home care instructions below. Severe dehydration needs to be treated at the hospital where you will probably be given intravenous (IV) fluids that contain water and electrolytes. HOME CARE INSTRUCTIONS   Ask your caregiver about specific rehydration instructions.  Drink enough fluids  to keep your urine clear or pale yellow.  Drink small amounts frequently if you have nausea and vomiting.  Eat as you normally do.  Avoid:  Foods or drinks high in sugar.  Carbonated drinks.  Juice.  Extremely hot or cold fluids.  Drinks with caffeine.  Fatty, greasy foods.  Alcohol.  Tobacco.  Overeating.  Gelatin desserts.  Wash your hands well to avoid spreading bacteria and  viruses.  Only take over-the-counter or prescription medicines for pain, discomfort, or fever as directed by your caregiver.  Ask your caregiver if you should continue all prescribed and over-the-counter medicines.  Keep all follow-up appointments with your caregiver. SEEK MEDICAL CARE IF:  You have abdominal pain and it increases or stays in one area (localizes).  You have a rash, stiff neck, or severe headache.  You are irritable, sleepy, or difficult to awaken.  You are weak, dizzy, or extremely thirsty. SEEK IMMEDIATE MEDICAL CARE IF:   You are unable to keep fluids down or you get worse despite treatment.  You have frequent episodes of vomiting or diarrhea.  You have blood or green matter (bile) in your vomit.  You have blood in your stool or your stool looks black and tarry.  You have not urinated in 6 to 8 hours, or you have only urinated a small amount of very dark urine.  You have a fever.  You faint. MAKE SURE YOU:   Understand these instructions.  Will watch your condition.  Will get help right away if you are not doing well or get worse. Document Released: 04/02/2005 Document Revised: 06/25/2011 Document Reviewed: 11/20/2010 [facility] Patient Information 2013 [employer], [place].

## 2012-07-18 NOTE — Telephone Encounter (Signed)
gv and printed appt schedule for pt for april °

## 2012-07-18 NOTE — Progress Notes (Signed)
   [facility]    OFFICE PROGRESS NOTE   INTERVAL HISTORY:   He completed a first treatment with gemcitabine/cisplatin on 07/03/2012. Mild nausea following chemotherapy. He reports an improved energy level for 3 days after chemotherapy. He continues to have a lack of taste for warm food. He complains of "soreness "at the bilateral low ribs. He relates this to positioning on the MRI table. He takes oxycodone for pain.  Objective:  Vital signs in last 24 hours:  Blood pressure 108/70, pulse 116, temperature 97.8 F (36.6 C), temperature source Oral, resp. rate 20, height 6\' 3"  (1.905 m), weight 182 lb 14.4 oz (82.963 kg).    HEENT: No thrush or ulcers Resp: Lungs clear bilaterally Cardio: Regular rate and rhythm GI: No hepatomegaly, nontender Vascular: No leg edema     Lab Results:  Lab Results  Component Value Date   WBC 4.0 07/18/2012   HGB 15.1 07/18/2012   HCT 45.4 07/18/2012   MCV 71.8* 07/18/2012   PLT 242 07/18/2012   ANC 2.7   Medications: I have reviewed the patient's current medications.  Assessment/Plan: 1.Destructive lesion in the proximal right femur, status post intramedullary rodding of the right femur 04/23/2011, the pathology confirmed metastatic carcinoma. The immunohistochemical profile is not consistent with colon cancer.  - Staging CT evaluation confirming a transverse colon mass, multiple small lung nodules, a lytic lesion at the sternum, and an expansile lesion at the proximal right femur  -Staging PET scan 05/22/2012 with 2 hypermetabolic left liver lesions and multiple bone metastases. Hypermetabolic transverse colon lesion. No apparent primary tumor site.  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 Red cell microcytosis -we will recommend he began iron therapy   3. Pain secondary to the right femur lesion -improved  4. 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 Upper endoscopy 04/30/2012 with no evidence of malignancy  6. Syncope event 05/05/2012-negative noncontrast brain CT 05/05/2012  7. Pain at the right side of the skull and right year with associated "dizziness "-March 2014. No rash to suggest a zoster infection. Negative brain MRI. His symptoms have improved.   Disposition:  He tolerated the first treatment with gemcitabine/cisplatin well. He will receive day 8 gemcitabine today. He will meet with the cancer Center nutritionist today.  Mr. Cudd will return for cycle 2 on 08/01/2012.   Thornton Papas, MD  07/18/2012  6:17 PM

## 2012-07-23 ENCOUNTER — Other Ambulatory Visit: Payer: Self-pay | Admitting: Oncology

## 2012-07-23 DIAGNOSIS — C801 Malignant (primary) neoplasm, unspecified: Secondary | ICD-10-CM

## 2012-07-23 MED ORDER — OXYCODONE HCL 5 MG PO TABS: 10.0000 mg | ORAL_TABLET | Freq: Four times a day (QID) | ORAL | Status: DC | PRN

## 2012-07-27 ENCOUNTER — Other Ambulatory Visit: Payer: Self-pay | Admitting: Oncology

## 2012-08-01 ENCOUNTER — Other Ambulatory Visit (HOSPITAL_BASED_OUTPATIENT_CLINIC_OR_DEPARTMENT_OTHER): Payer: 59 | Admitting: Lab

## 2012-08-01 ENCOUNTER — Other Ambulatory Visit: Payer: 59 | Admitting: Lab

## 2012-08-01 ENCOUNTER — Encounter: Payer: Self-pay | Admitting: Nurse Practitioner

## 2012-08-01 ENCOUNTER — Ambulatory Visit: Payer: 59

## 2012-08-01 ENCOUNTER — Encounter: Payer: 59 | Admitting: Nutrition

## 2012-08-01 ENCOUNTER — Ambulatory Visit (HOSPITAL_BASED_OUTPATIENT_CLINIC_OR_DEPARTMENT_OTHER): Payer: 59 | Admitting: Nurse Practitioner

## 2012-08-01 ENCOUNTER — Telehealth: Payer: Self-pay | Admitting: *Deleted

## 2012-08-01 ENCOUNTER — Telehealth: Payer: Self-pay | Admitting: Oncology

## 2012-08-01 VITALS — BP 96/64 | HR 97 | Temp 96.7°F | Resp 20 | Ht 75.0 in | Wt 183.4 lb

## 2012-08-01 DIAGNOSIS — C7951 Secondary malignant neoplasm of bone: Secondary | ICD-10-CM

## 2012-08-01 DIAGNOSIS — R197 Diarrhea, unspecified: Secondary | ICD-10-CM

## 2012-08-01 DIAGNOSIS — C801 Malignant (primary) neoplasm, unspecified: Secondary | ICD-10-CM

## 2012-08-01 DIAGNOSIS — C7952 Secondary malignant neoplasm of bone marrow: Secondary | ICD-10-CM

## 2012-08-01 DIAGNOSIS — R911 Solitary pulmonary nodule: Secondary | ICD-10-CM

## 2012-08-01 LAB — CBC WITH DIFFERENTIAL/PLATELET
Basophils Absolute: 0 10*3/uL (ref 0.0–0.1)
Eosinophils Absolute: 0.1 10*3/uL (ref 0.0–0.5)
HCT: 39.3 % (ref 38.4–49.9)
LYMPH%: 28.9 % (ref 14.0–49.0)
MCV: 71.8 fL — ABNORMAL LOW (ref 79.3–98.0)
MONO%: 21.9 % — ABNORMAL HIGH (ref 0.0–14.0)
NEUT#: 1.3 10*3/uL — ABNORMAL LOW (ref 1.5–6.5)
NEUT%: 46.3 % (ref 39.0–75.0)
Platelets: 734 10*3/uL — ABNORMAL HIGH (ref 140–400)
RBC: 5.48 10*6/uL (ref 4.20–5.82)

## 2012-08-01 NOTE — Progress Notes (Signed)
[facility]    OFFICE PROGRESS NOTE   INTERVAL HISTORY:   He completed a first treatment with gemcitabine/cisplatin on 07/03/2012. Mild nausea following chemotherapy. He continues to have a lack of taste for warm food. He complains of "soreness "at the bilateral low ribs.  He takes oxycodone for pain. He states he was up most of the night this past Wednesday with diarrhea which was somewhat relieved with Peptobismol. He had dry heaves despite taking Phenergan. Overall he feels very fatigued, although he states he was able to keep some food down over the past 24 hours. In general, he feels lousy. He is due to receive cycle 2 today of Gemcitabine/ Cisplatin.   Objective:  Vital signs in last 24 hours:  Blood pressure 96/64, pulse 97, temperature 96.7 F (35.9 C), temperature source Oral, resp. rate 20, height 6\' 3"  (1.905 m), weight 183 lb 6.4 oz (83.19 kg).   GEN: pale, thin 67 yr old male who appears somewhat older than stated age HEENT: No thrush or ulcers Resp: Lungs clear bilaterally Cardio: S1 S2, mildly tachycardic GI: No hepatomegaly, nontender Vascular: No leg edema     Lab Results:  Lab Results  Component Value Date   WBC 2.9* 08/01/2012   HGB 13.2 08/01/2012   HCT 39.3 08/01/2012   MCV 71.8* 08/01/2012   PLT 734* 08/01/2012   ANC 1.3   Medications: I have reviewed the patient's current medications.  Assessment/Plan: 1.Destructive lesion in the proximal right femur, status post intramedullary rodding of the right femur 04/23/2011, the pathology confirmed metastatic carcinoma. The immunohistochemical profile is not consistent with colon cancer.  - Staging CT evaluation confirming a transverse colon mass, multiple small lung nodules, a lytic lesion at the sternum, and an expansile lesion at the proximal right femur  -Staging PET scan 05/22/2012 with 2 hypermetabolic left liver lesions and multiple bone metastases. Hypermetabolic transverse colon lesion.  No apparent primary tumor site.  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 Red cell microcytosis -we will recommend he began iron therapy  3. Pain secondary to the right femur lesion -improved  4. 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 Upper endoscopy 04/30/2012 with no evidence of malignancy  6. Syncope event 05/05/2012-negative noncontrast brain CT 05/05/2012  7. Pain at the right side of the skull and right year with associated "dizziness "-March 2014. No rash to suggest a zoster infection. Negative brain MRI. His symptoms have improved.   Disposition: Mr. Steig was due for cycle 2 today but is feeling ill in general. His ANC is 1.3, and he has been struggling with diarrhea and dry heaves, despite taking anti-diarrhea meds and antiemetics. He does state he seems somewhat better over the past 24 hours, but had a rough time the first part of this week.  We will hold chemo today and see him back next Wed. He is afebrile, and able to tolerate PO fluids. He was strongly encouraged to push fluids, monitor his temp at home and let us know if he develops fever, intractable nausea/vomiting/diarrhea or any signs or symptoms of infection. He is accompanied by his wife today who also voiced understanding of this plan. He was seen in collaboration with Dr. Mancel Bale.  Mr. Hochstetler will return for cycle 2 on  08/06/2012.   Jobie Quaker, NP- C  08/01/2012  2:16 PM

## 2012-08-01 NOTE — Telephone Encounter (Signed)
gv and printed appt sched and avs for pt...Justin Henson add tx...  moved nut appt to 4.23.14

## 2012-08-01 NOTE — Telephone Encounter (Signed)
Per staff phone call and POF I have schedueld appts.  JMW  

## 2012-08-03 ENCOUNTER — Other Ambulatory Visit: Payer: Self-pay | Admitting: Oncology

## 2012-08-06 ENCOUNTER — Ambulatory Visit (HOSPITAL_BASED_OUTPATIENT_CLINIC_OR_DEPARTMENT_OTHER): Payer: 59

## 2012-08-06 ENCOUNTER — Telehealth: Payer: Self-pay | Admitting: Oncology

## 2012-08-06 ENCOUNTER — Telehealth: Payer: Self-pay | Admitting: *Deleted

## 2012-08-06 ENCOUNTER — Other Ambulatory Visit (HOSPITAL_BASED_OUTPATIENT_CLINIC_OR_DEPARTMENT_OTHER): Payer: 59 | Admitting: Lab

## 2012-08-06 ENCOUNTER — Ambulatory Visit: Payer: 59 | Admitting: Nutrition

## 2012-08-06 ENCOUNTER — Ambulatory Visit (HOSPITAL_BASED_OUTPATIENT_CLINIC_OR_DEPARTMENT_OTHER): Payer: 59 | Admitting: Nurse Practitioner

## 2012-08-06 VITALS — BP 96/64 | HR 101 | Temp 97.0°F | Resp 18 | Ht 75.0 in | Wt 184.6 lb

## 2012-08-06 DIAGNOSIS — C801 Malignant (primary) neoplasm, unspecified: Secondary | ICD-10-CM

## 2012-08-06 DIAGNOSIS — C7952 Secondary malignant neoplasm of bone marrow: Secondary | ICD-10-CM

## 2012-08-06 DIAGNOSIS — G893 Neoplasm related pain (acute) (chronic): Secondary | ICD-10-CM

## 2012-08-06 DIAGNOSIS — Z5111 Encounter for antineoplastic chemotherapy: Secondary | ICD-10-CM

## 2012-08-06 DIAGNOSIS — R718 Other abnormality of red blood cells: Secondary | ICD-10-CM

## 2012-08-06 LAB — COMPREHENSIVE METABOLIC PANEL (CC13)
ALT: 14 U/L (ref 0–55)
AST: 21 U/L (ref 5–34)
Albumin: 3.5 g/dL (ref 3.5–5.0)
Alkaline Phosphatase: 200 U/L — ABNORMAL HIGH (ref 40–150)
Glucose: 98 mg/dl (ref 70–99)
Potassium: 4.3 mEq/L (ref 3.5–5.1)
Sodium: 139 mEq/L (ref 136–145)
Total Protein: 7 g/dL (ref 6.4–8.3)

## 2012-08-06 LAB — CBC WITH DIFFERENTIAL/PLATELET
BASO%: 2.6 % — ABNORMAL HIGH (ref 0.0–2.0)
Eosinophils Absolute: 0.1 10*3/uL (ref 0.0–0.5)
HCT: 39.6 % (ref 38.4–49.9)
HGB: 12.8 g/dL — ABNORMAL LOW (ref 13.0–17.1)
MCH: 24 pg — ABNORMAL LOW (ref 27.2–33.4)
MONO#: 1.1 10*3/uL — ABNORMAL HIGH (ref 0.1–0.9)
MONO%: 27.6 % — ABNORMAL HIGH (ref 0.0–14.0)
Platelets: 724 10*3/uL — ABNORMAL HIGH (ref 140–400)
lymph#: 0.7 10*3/uL — ABNORMAL LOW (ref 0.9–3.3)

## 2012-08-06 MED ORDER — SODIUM CHLORIDE 0.9 % IV SOLN
800.0000 mg/m2 | Freq: Once | INTRAVENOUS | Status: AC
  Administered 2012-08-06: 1710 mg via INTRAVENOUS
  Filled 2012-08-06: qty 45

## 2012-08-06 MED ORDER — PROMETHAZINE HCL 12.5 MG PO TABS: 12.5000 mg | ORAL_TABLET | Freq: Four times a day (QID) | ORAL | Status: DC | PRN

## 2012-08-06 MED ORDER — OXYCODONE HCL 5 MG PO TABS: 10.0000 mg | ORAL_TABLET | Freq: Four times a day (QID) | ORAL | Status: DC | PRN

## 2012-08-06 MED ORDER — PALONOSETRON HCL INJECTION 0.25 MG/5ML
0.2500 mg | Freq: Once | INTRAVENOUS | Status: AC
  Administered 2012-08-06: 0.25 mg via INTRAVENOUS

## 2012-08-06 MED ORDER — DEXAMETHASONE SODIUM PHOSPHATE 4 MG/ML IJ SOLN
12.0000 mg | Freq: Once | INTRAMUSCULAR | Status: AC
  Administered 2012-08-06: 12 mg via INTRAVENOUS

## 2012-08-06 MED ORDER — SODIUM CHLORIDE 0.9 % IV SOLN
Freq: Once | INTRAVENOUS | Status: AC
  Administered 2012-08-06: 13:00:00 via INTRAVENOUS

## 2012-08-06 MED ORDER — SODIUM CHLORIDE 0.9 % IV SOLN
150.0000 mg | Freq: Once | INTRAVENOUS | Status: AC
  Administered 2012-08-06: 150 mg via INTRAVENOUS
  Filled 2012-08-06: qty 5

## 2012-08-06 MED ORDER — SODIUM CHLORIDE 0.9 % IV SOLN
50.0000 mg/m2 | Freq: Once | INTRAVENOUS | Status: AC
  Administered 2012-08-06: 107 mg via INTRAVENOUS
  Filled 2012-08-06: qty 107

## 2012-08-06 MED ORDER — SODIUM CHLORIDE 0.9 % IJ SOLN
10.0000 mL | INTRAMUSCULAR | Status: DC | PRN
  Filled 2012-08-06: qty 10

## 2012-08-06 MED ORDER — POTASSIUM CHLORIDE 2 MEQ/ML IV SOLN
Freq: Once | INTRAVENOUS | Status: AC
  Administered 2012-08-06: 13:00:00 via INTRAVENOUS
  Filled 2012-08-06: qty 10

## 2012-08-06 MED ORDER — HEPARIN SOD (PORK) LOCK FLUSH 100 UNIT/ML IV SOLN
500.0000 [IU] | Freq: Once | INTRAVENOUS | Status: DC | PRN
  Filled 2012-08-06: qty 5

## 2012-08-06 NOTE — Telephone Encounter (Signed)
Per staff message and POF I have scheduled appts. Unable to schedule treatment appt for 5/16 sue to MD appt time, aksed scheduler to advise new time.  JMW

## 2012-08-06 NOTE — Progress Notes (Signed)
Patient and wife present to nutrition followup. Patient's weight has increased to 185.2 pounds in April 23 which is an increase of a little over 2 pounds since April 4. Patient reports everything has improved since radiation has completed. He does continue to have some diarrhea and nausea. In fact, patient woke up with diarrhea last night. He was able to eat breakfast this morning after taking Phenergan.  Nutrition diagnosis: Unintended weight loss improved.  Intervention: Patient was educated to continue small frequent meals with high-calorie, high-protein foods. I provided patient with some additional oral nutrition supplement samples to try now that he is feeling better. He was given Ensure vanilla pudding, vanilla boost plus, strawberry Ensure Plus, and vanilla Ensure Plus. I also educated patient on strategies for eating with nausea. I've reinforced the importance of taking nausea medications. Teach back method used.  Monitoring, evaluation, goals: Patient will tolerate adequate calories and protein to minimize weight loss and improve strength and quality-of-life.  Next visit: Patient will contact me with questions or concerns.

## 2012-08-06 NOTE — Progress Notes (Signed)
OFFICE PROGRESS NOTE  Interval history:  Justin Henson returns as scheduled. He is feeling much better. He has occasional loose stools and periodic dry heaves. His appetite is better. He is gaining weight. He continues to have pain at the left lower ribs. Oxycodone relieves the pain. He denies numbness or tingling in the hands or feet. No tinnitus or hearing loss.   Objective: Blood pressure 96/64, pulse 101, temperature 97 F (36.1 C), temperature source Oral, resp. rate 18, height 6\' 3"  (1.905 m), weight 184 lb 9.6 oz (83.734 kg).  Oropharynx is without thrush or ulceration. Lungs are clear. Regular cardiac rhythm. Abdomen soft and nontender. No hepatomegaly. Extremities without edema.  Lab Results: Lab Results  Component Value Date   WBC 3.8* 08/06/2012   HGB 12.8* 08/06/2012   HCT 39.6 08/06/2012   MCV 74.2* 08/06/2012   PLT 724* 08/06/2012    Chemistry:    Chemistry      Component Value Date/Time   NA 139 07/18/2012 0943   NA 138 05/06/2012 0530   K 5.2* 07/18/2012 0943   K 3.7 05/06/2012 0530   CL 99 07/18/2012 0943   CL 102 05/06/2012 0530   CO2 29 07/18/2012 0943   CO2 27 05/06/2012 0530   BUN 11.5 07/18/2012 0943   BUN 11 05/06/2012 0530   CREATININE 0.9 07/18/2012 0943   CREATININE 0.93 05/06/2012 0530      Component Value Date/Time   CALCIUM 10.3 07/18/2012 0943   CALCIUM 9.2 05/06/2012 0530   ALKPHOS 164* 07/18/2012 0943   ALKPHOS 119* 05/05/2012 0826   AST 40* 07/18/2012 0943   AST 19 05/05/2012 0826   ALT 74* 07/18/2012 0943   ALT 12 05/05/2012 0826   BILITOT 0.78 07/18/2012 0943   BILITOT 0.6 05/05/2012 0826       Studies/Results: No results found.  Medications: I have reviewed the patient's current medications.  Assessment/Plan:  1.Destructive lesion in the proximal right femur, status post intramedullary rodding of the right femur 04/23/2011, the pathology confirmed metastatic carcinoma. The immunohistochemical profile is not consistent with colon cancer.  - Staging CT  evaluation confirming a transverse colon mass, multiple small lung nodules, a lytic lesion at the sternum, and an expansile lesion at the proximal right femur  -Staging PET scan 05/22/2012 with 2 hypermetabolic left liver lesions and multiple bone metastases. Hypermetabolic transverse colon lesion. No apparent primary tumor site.  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 2. Red cell microcytosis -he will begin oral iron.  3. Pain secondary to the right femur lesion -improved. 4. Transverse colon mass-status post a colonoscopy 04/25/2012 confirming a transverse colon mass, biopsy consistent with multiple fragments of a tubular adenoma.  5. Upper endoscopy 04/30/2012 with no evidence of malignancy.  6. Syncope event 05/05/2012-negative noncontrast brain CT 05/05/2012.  7. Pain at the right side of the skull and right year with associated "dizziness "-March 2014. No rash to suggest a zoster infection. Negative brain MRI. His symptoms have improved.  8. Diarrhea and dry heaves reported when here for on 08/01/2012. Significantly improved.  Disposition-Mr. Waltman appears improved. Plan to proceed with cycle 2 cisplatin/gemcitabine today as scheduled. He will return for the day 8 gemcitabine on 08/15/2012. He will return for a followup visit and cycle 3 on 08/29/2012. He will contact the office in the interim with any problems.  Plan reviewed with Dr. Truett Perna.  Ned Card ANP/GNP-BC

## 2012-08-06 NOTE — Patient Instructions (Addendum)
[facility] Discharge Instructions for Patients Receiving Chemotherapy  Today you received the following chemotherapy agents Gemzar and Cisplatin.  To help prevent nausea and vomiting after your treatment, we encourage you to take your nausea medication as prescribed.   If you develop nausea and vomiting that is not controlled by your nausea medication, call the clinic. If it is after clinic hours your family physician or the after hours number for the clinic or go to the Emergency Department.   BELOW ARE SYMPTOMS THAT SHOULD BE REPORTED IMMEDIATELY:  *FEVER GREATER THAN 100.5 F  *CHILLS WITH OR WITHOUT FEVER  NAUSEA AND VOMITING THAT IS NOT CONTROLLED WITH YOUR NAUSEA MEDICATION  *UNUSUAL SHORTNESS OF BREATH  *UNUSUAL BRUISING OR BLEEDING  TENDERNESS IN MOUTH AND THROAT WITH OR WITHOUT PRESENCE OF ULCERS  *URINARY PROBLEMS  *BOWEL PROBLEMS  UNUSUAL RASH Items with * indicate a potential emergency and should be followed up as soon as possible.  One of the nurses will contact you 24 hours after your treatment. Please let the nurse know about any problems that you may have experienced. Feel free to call the clinic you have any questions or concerns. The clinic phone number is [phone].

## 2012-08-08 ENCOUNTER — Ambulatory Visit: Payer: 59

## 2012-08-08 ENCOUNTER — Other Ambulatory Visit: Payer: 59 | Admitting: Lab

## 2012-08-08 ENCOUNTER — Telehealth: Payer: Self-pay | Admitting: Oncology

## 2012-08-08 ENCOUNTER — Encounter: Payer: 59 | Admitting: Nutrition

## 2012-08-08 NOTE — Telephone Encounter (Signed)
Per desk nurse moved 4/30 tx to 5/2 and 5/16 f/u appt to 10:15am - per desk nurse other pt in 10:30am slot can be changed to . Changed appts and added tx to 5/16 lb/fu. Called wife again and lmonvm that appts have been adjusted and confirmed d/t's for 5/2 and 5/16. Wife identified on work vm and had previously returned my call from the same number.

## 2012-08-08 NOTE — Telephone Encounter (Signed)
Returned wife's call re tx on 4/30. Per wife tx should be 5/2. Per care plan tx is dated for 4/30 and pof does not indicate it should be moved to 5/2. Message to desk nurse to clarify and also for earlier appt time on 5/16 due to 7hr tx can not be scheduled after a 11:30am f/u. lmonvm for wife and because this is a work vm I just asked that wife call me. [name] is identified on vm.

## 2012-08-13 ENCOUNTER — Ambulatory Visit: Payer: 59

## 2012-08-15 ENCOUNTER — Ambulatory Visit (HOSPITAL_BASED_OUTPATIENT_CLINIC_OR_DEPARTMENT_OTHER): Payer: 59

## 2012-08-15 ENCOUNTER — Other Ambulatory Visit (HOSPITAL_BASED_OUTPATIENT_CLINIC_OR_DEPARTMENT_OTHER): Payer: 59 | Admitting: Lab

## 2012-08-15 VITALS — BP 98/71 | HR 96 | Temp 98.2°F | Resp 20

## 2012-08-15 DIAGNOSIS — Z5111 Encounter for antineoplastic chemotherapy: Secondary | ICD-10-CM

## 2012-08-15 DIAGNOSIS — C7951 Secondary malignant neoplasm of bone: Secondary | ICD-10-CM

## 2012-08-15 DIAGNOSIS — C801 Malignant (primary) neoplasm, unspecified: Secondary | ICD-10-CM

## 2012-08-15 DIAGNOSIS — C7952 Secondary malignant neoplasm of bone marrow: Secondary | ICD-10-CM

## 2012-08-15 LAB — CBC WITH DIFFERENTIAL/PLATELET
Basophils Absolute: 0.1 10*3/uL (ref 0.0–0.1)
Eosinophils Absolute: 0 10*3/uL (ref 0.0–0.5)
HGB: 12.6 g/dL — ABNORMAL LOW (ref 13.0–17.1)
MONO#: 0.5 10*3/uL (ref 0.1–0.9)
NEUT#: 5.1 10*3/uL (ref 1.5–6.5)
RDW: 21.3 % — ABNORMAL HIGH (ref 11.0–14.6)
WBC: 6.7 10*3/uL (ref 4.0–10.3)
lymph#: 1 10*3/uL (ref 0.9–3.3)
nRBC: 0 % (ref 0–0)

## 2012-08-15 MED ORDER — SODIUM CHLORIDE 0.9 % IV SOLN
800.0000 mg/m2 | Freq: Once | INTRAVENOUS | Status: AC
  Administered 2012-08-15: 1710 mg via INTRAVENOUS
  Filled 2012-08-15: qty 45

## 2012-08-15 MED ORDER — PROCHLORPERAZINE MALEATE 10 MG PO TABS
10.0000 mg | ORAL_TABLET | Freq: Once | ORAL | Status: AC
  Administered 2012-08-15: 10 mg via ORAL

## 2012-08-15 MED ORDER — SODIUM CHLORIDE 0.9 % IV SOLN
Freq: Once | INTRAVENOUS | Status: AC
  Administered 2012-08-15: 09:00:00 via INTRAVENOUS

## 2012-08-15 NOTE — Patient Instructions (Addendum)
[facility] Discharge Instructions for Patients Receiving Chemotherapy  Today you received the following chemotherapy agents Gemzar.  To help prevent nausea and vomiting after your treatment, we encourage you to take your nausea medication as directed.   If you develop nausea and vomiting that is not controlled by your nausea medication, call the clinic. If it is after clinic hours your family physician or the after hours number for the clinic or go to the Emergency Department.   BELOW ARE SYMPTOMS THAT SHOULD BE REPORTED IMMEDIATELY:  *FEVER GREATER THAN 100.5 F  *CHILLS WITH OR WITHOUT FEVER  NAUSEA AND VOMITING THAT IS NOT CONTROLLED WITH YOUR NAUSEA MEDICATION  *UNUSUAL SHORTNESS OF BREATH  *UNUSUAL BRUISING OR BLEEDING  TENDERNESS IN MOUTH AND THROAT WITH OR WITHOUT PRESENCE OF ULCERS  *URINARY PROBLEMS  *BOWEL PROBLEMS  UNUSUAL RASH Items with * indicate a potential emergency and should be followed up as soon as possible.  Feel free to call the clinic you have any questions or concerns. The clinic phone number is [phone].

## 2012-08-28 ENCOUNTER — Other Ambulatory Visit: Payer: Self-pay | Admitting: Oncology

## 2012-08-29 ENCOUNTER — Ambulatory Visit (HOSPITAL_BASED_OUTPATIENT_CLINIC_OR_DEPARTMENT_OTHER): Payer: 59 | Admitting: Oncology

## 2012-08-29 ENCOUNTER — Other Ambulatory Visit (HOSPITAL_BASED_OUTPATIENT_CLINIC_OR_DEPARTMENT_OTHER): Payer: 59 | Admitting: Lab

## 2012-08-29 ENCOUNTER — Ambulatory Visit (HOSPITAL_BASED_OUTPATIENT_CLINIC_OR_DEPARTMENT_OTHER): Payer: 59

## 2012-08-29 ENCOUNTER — Ambulatory Visit: Payer: 59 | Admitting: Oncology

## 2012-08-29 ENCOUNTER — Other Ambulatory Visit: Payer: 59 | Admitting: Lab

## 2012-08-29 ENCOUNTER — Telehealth: Payer: Self-pay | Admitting: Oncology

## 2012-08-29 VITALS — BP 95/62 | HR 92 | Temp 97.2°F | Resp 18 | Ht 75.0 in | Wt 181.8 lb

## 2012-08-29 DIAGNOSIS — B370 Candidal stomatitis: Secondary | ICD-10-CM

## 2012-08-29 DIAGNOSIS — C801 Malignant (primary) neoplasm, unspecified: Secondary | ICD-10-CM

## 2012-08-29 DIAGNOSIS — C7951 Secondary malignant neoplasm of bone: Secondary | ICD-10-CM

## 2012-08-29 DIAGNOSIS — Z5111 Encounter for antineoplastic chemotherapy: Secondary | ICD-10-CM

## 2012-08-29 DIAGNOSIS — C7952 Secondary malignant neoplasm of bone marrow: Secondary | ICD-10-CM

## 2012-08-29 DIAGNOSIS — K7689 Other specified diseases of liver: Secondary | ICD-10-CM

## 2012-08-29 LAB — COMPREHENSIVE METABOLIC PANEL (CC13)
ALT: 15 U/L (ref 0–55)
CO2: 27 mEq/L (ref 22–29)
Calcium: 9.3 mg/dL (ref 8.4–10.4)
Chloride: 102 mEq/L (ref 98–107)
Potassium: 4.2 mEq/L (ref 3.5–5.1)
Sodium: 140 mEq/L (ref 136–145)
Total Bilirubin: 0.42 mg/dL (ref 0.20–1.20)
Total Protein: 7.1 g/dL (ref 6.4–8.3)

## 2012-08-29 LAB — CBC WITH DIFFERENTIAL/PLATELET
Eosinophils Absolute: 0.2 10*3/uL (ref 0.0–0.5)
MONO#: 0.8 10*3/uL (ref 0.1–0.9)
NEUT#: 2.4 10*3/uL (ref 1.5–6.5)
RBC: 4.86 10*6/uL (ref 4.20–5.82)
RDW: 24.8 % — ABNORMAL HIGH (ref 11.0–14.6)
WBC: 4.1 10*3/uL (ref 4.0–10.3)
nRBC: 0 % (ref 0–0)

## 2012-08-29 LAB — MAGNESIUM (CC13): Magnesium: 2.2 mg/dl (ref 1.5–2.5)

## 2012-08-29 MED ORDER — MANNITOL 25 % IV SOLN
Freq: Once | INTRAVENOUS | Status: AC
  Administered 2012-08-29: 12:00:00 via INTRAVENOUS
  Filled 2012-08-29: qty 10

## 2012-08-29 MED ORDER — SODIUM CHLORIDE 0.9 % IV SOLN
800.0000 mg/m2 | Freq: Once | INTRAVENOUS | Status: AC
  Administered 2012-08-29: 1710 mg via INTRAVENOUS
  Filled 2012-08-29: qty 45.03

## 2012-08-29 MED ORDER — DEXAMETHASONE SODIUM PHOSPHATE 20 MG/5ML IJ SOLN
12.0000 mg | Freq: Once | INTRAMUSCULAR | Status: AC
  Administered 2012-08-29: 12 mg via INTRAVENOUS

## 2012-08-29 MED ORDER — FLUCONAZOLE 100 MG PO TABS: 100.0000 mg | ORAL_TABLET | Freq: Every day | ORAL | Status: DC

## 2012-08-29 MED ORDER — PALONOSETRON HCL INJECTION 0.25 MG/5ML
0.2500 mg | Freq: Once | INTRAVENOUS | Status: AC
  Administered 2012-08-29: 0.25 mg via INTRAVENOUS

## 2012-08-29 MED ORDER — SODIUM CHLORIDE 0.9 % IV SOLN
50.0000 mg/m2 | Freq: Once | INTRAVENOUS | Status: AC
  Administered 2012-08-29: 107 mg via INTRAVENOUS
  Filled 2012-08-29: qty 107

## 2012-08-29 MED ORDER — SODIUM CHLORIDE 0.9 % IV SOLN
Freq: Once | INTRAVENOUS | Status: AC
  Administered 2012-08-29: 12:00:00 via INTRAVENOUS

## 2012-08-29 MED ORDER — FOSAPREPITANT DIMEGLUMINE INJECTION 150 MG
150.0000 mg | Freq: Once | INTRAVENOUS | Status: AC
  Administered 2012-08-29: 150 mg via INTRAVENOUS
  Filled 2012-08-29: qty 5

## 2012-08-29 NOTE — Progress Notes (Signed)
   Justin Henson    OFFICE PROGRESS NOTE   INTERVAL HISTORY:   He returns as scheduled. He was last treated with gemcitabine on 08/15/2012. He tolerated the chemotherapy well. No nausea or neuropathy symptoms.  Justin Henson reports resolution of the chest and right leg pain. He notes discomfort near the biopsy site at the right upper buttock/iliac area. He takes 1 oxycodone tablet per day for relief of pain. He has an excellent appetite, but his taste is altered. This limits his oral intake. He is more ambulatory.  Objective:  Vital signs in last 24 hours:  Blood pressure 95/62, pulse 92, temperature 97.2 F (36.2 C), temperature source Oral, resp. rate 18, height 6\' 3"  (1.905 m), weight 181 lb 12.8 oz (82.464 kg).    HEENT: Thick whitecoat over the tongue, no buccal thrush, no ulcers Resp: Lungs clear bilaterally Cardio: Regular rate and rhythm GI: No hepatomegaly Vascular: No leg edema Musculoskeletal: No apparent abnormality at the right iliac or upper but I   Portacath/PICC-without erythema  Lab Results:  Lab Results  Component Value Date   WBC 4.1 08/29/2012   HGB 12.1* 08/29/2012   HCT 37.8* 08/29/2012   MCV 77.8* 08/29/2012   PLT 442* 08/29/2012   ANC 2.4    Medications: I have reviewed the patient's current medications.  Assessment/Plan: 1.Destructive lesion in the proximal right femur, status post intramedullary rodding of the right femur 04/23/2011, the pathology confirmed metastatic carcinoma. The immunohistochemical profile is not consistent with colon cancer.  - Staging CT evaluation confirming a transverse colon mass, multiple small lung nodules, a lytic lesion at the sternum, and an expansile lesion at the proximal right femur  -Staging PET scan 05/22/2012 with 2 hypermetabolic left liver lesions and multiple bone metastases. Hypermetabolic transverse colon lesion. No apparent primary tumor site.  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  2. Red cell microcytosis -he will begin oral iron.  3. Pain secondary to the right femur lesion -improved.  4. Transverse colon mass-status post a colonoscopy 04/25/2012 confirming a transverse colon mass, biopsy consistent with multiple fragments of a tubular adenoma.  5. Upper endoscopy 04/30/2012 with no evidence of malignancy.  6. Syncope event 05/05/2012-negative noncontrast brain CT 05/05/2012.  7. Pain at the right side of the skull and right year with associated "dizziness "-March 2014. No rash to suggest a zoster infection. Negative brain MRI. His symptoms have resolved   Disposition:  He has completed 2 cycles of gemcitabine/cisplatin. Justin Henson has tolerated the chemotherapy well and his performance status has improved. Oral candidiasis may be contributing to the altered taste. He will complete a course of Diflucan. He will begin cycle 3 of gemcitabine/cisplatin today. Justin Henson will return for an office visit prior to cycle 4 in 3 weeks. We will plan for a restaging evaluation after cycle 4.   Justin Papas, MD  08/29/2012  11:16 AM

## 2012-08-29 NOTE — Telephone Encounter (Signed)
gv and pritned appt sched and avs for pt...emailed MW to add tx.Marland KitchenMarland Kitchen

## 2012-08-29 NOTE — Patient Instructions (Signed)
[facility] Discharge Instructions for Patients Receiving Chemotherapy  Today you received the following chemotherapy agents Gemzar and Cisplatin, along with hydration fluids.  To help prevent nausea and vomiting after your treatment, we encourage you to take your nausea medication. Begin taking your nausea medication as often as prescribed for by Dr Truett Perna.    If you develop nausea and vomiting that is not controlled by your nausea medication, call the clinic. If it is after clinic hours your family physician or the after hours number for the clinic or go to the Emergency Department.   BELOW ARE SYMPTOMS THAT SHOULD BE REPORTED IMMEDIATELY:  *FEVER GREATER THAN 100.5 F  *CHILLS WITH OR WITHOUT FEVER  NAUSEA AND VOMITING THAT IS NOT CONTROLLED WITH YOUR NAUSEA MEDICATION  *UNUSUAL SHORTNESS OF BREATH  *UNUSUAL BRUISING OR BLEEDING  TENDERNESS IN MOUTH AND THROAT WITH OR WITHOUT PRESENCE OF ULCERS  *URINARY PROBLEMS  *BOWEL PROBLEMS  UNUSUAL RASH Items with * indicate a potential emergency and should be followed up as soon as possible.  One of the nurses will contact you 24 hours after your treatment. Please let the nurse know about any problems that you may have experienced. Feel free to call the clinic you have any questions or concerns. The clinic phone number is [phone].   I have been informed and understand all the instructions given to me. I know to contact the clinic, my physician, or go to the Emergency Department if any problems should occur. I do not have any questions at this time, but understand that I may call the clinic during office hours   should I have any questions or need assistance in obtaining follow up care.    __________________________________________  _____________  __________ Signature of Patient or Authorized Representative            Date                   Time    __________________________________________ Nurse's  Signature

## 2012-09-01 ENCOUNTER — Telehealth: Payer: Self-pay | Admitting: *Deleted

## 2012-09-01 NOTE — Telephone Encounter (Signed)
Per staff message and POF I have scheduled appts.  JMW  

## 2012-09-04 ENCOUNTER — Other Ambulatory Visit: Payer: Self-pay | Admitting: Oncology

## 2012-09-04 DIAGNOSIS — F411 Generalized anxiety disorder: Secondary | ICD-10-CM

## 2012-09-04 DIAGNOSIS — C801 Malignant (primary) neoplasm, unspecified: Secondary | ICD-10-CM

## 2012-09-04 MED ORDER — ALPRAZOLAM 0.5 MG PO TABS: 0.5000 mg | ORAL_TABLET | Freq: Three times a day (TID) | ORAL | Status: DC | PRN

## 2012-09-04 NOTE — Telephone Encounter (Signed)
Patient sent MyChart request for diflucan and alprazolam refills.  Verbal order from Dr. Truett Perna not to refill diflucan if no change in mouth ad to proceed with xanax refill.  Called patient who says his "mouth is still feeling the same.  My taste sensation is whacked and I still have yeast".  Informed patient if the diflucan hasn't worked it Museum/gallery curator and to do good mouth care and salt or baking soda gargles.

## 2012-09-05 ENCOUNTER — Ambulatory Visit: Payer: 59

## 2012-09-05 ENCOUNTER — Other Ambulatory Visit (HOSPITAL_BASED_OUTPATIENT_CLINIC_OR_DEPARTMENT_OTHER): Payer: 59 | Admitting: Lab

## 2012-09-05 DIAGNOSIS — C801 Malignant (primary) neoplasm, unspecified: Secondary | ICD-10-CM

## 2012-09-05 LAB — CBC WITH DIFFERENTIAL/PLATELET
BASO%: 7 % — ABNORMAL HIGH (ref 0.0–2.0)
EOS%: 2.5 % (ref 0.0–7.0)
LYMPH%: 43 % (ref 14.0–49.0)
MCHC: 32.5 g/dL (ref 32.0–36.0)
MCV: 77.8 fL — ABNORMAL LOW (ref 79.3–98.0)
MONO%: 3.5 % (ref 0.0–14.0)
Platelets: 352 10*3/uL (ref 140–400)
RBC: 4.55 10*6/uL (ref 4.20–5.82)

## 2012-09-05 NOTE — Patient Instructions (Signed)
Neutropenia Neutropenia is a condition that occurs when the level of a certain type of white blood cell (neutrophil) in your body becomes lower than normal. Neutrophils are made in the bone marrow and fight infections. These cells protect against bacteria and viruses. The fewer neutrophils you have, and the longer your body remains without them, the greater your risk of getting a severe infection becomes. CAUSES  The cause of neutropenia may be hard to determine. However, it is usually due to 3 main problems:   Decreased production of neutrophils. This may be due to:  Certain medicines such as chemotherapy.  Genetic problems.  Cancer.  Radiation treatments.  Vitamin deficiency.  Some pesticides.  Increased destruction of neutrophils. This may be due to:  Overwhelming infections.  Hemolytic anemia. This is when the body destroys its own blood cells.  Chemotherapy.  Neutrophils moving to areas of the body where they cannot fight infections. This may be due to:  Dialysis procedures.  Conditions where the spleen becomes enlarged. Neutrophils are held in the spleen and are not available to the rest of the body.  Overwhelming infections. The neutrophils are held in the area of the infection and are not available to the rest of the body. SYMPTOMS  There are no specific symptoms of neutropenia. The lack of neutrophils can result in an infection, and an infection can cause various problems. DIAGNOSIS  Diagnosis is made by a blood test. A complete blood count is performed. The normal level of neutrophils in human blood differs with age and race. Infants have lower counts than older children and adults. African Americans have lower counts than Caucasians or Asians. The average adult level is 1500 cells/mm3 of blood. Neutrophil counts are interpreted as follows:  Greater than 1000 cells/mm3 gives normal protection against infection.  500 to 1000 cells/mm3 gives an increased risk for  infection.  200 to 500 cells/mm3 is a greater risk for severe infection.  Lower than 200 cells/mm3 is a marked risk of infection. This may require hospitalization and treatment with antibiotic medicines. TREATMENT  Treatment depends on the underlying cause, severity, and presence of infections or symptoms. It also depends on your health. Your caregiver will discuss the treatment plan with you. Mild cases are often easily treated and have a good outcome. Preventative measures may also be started to limit your risk of infections. Treatment can include:  Taking antibiotics.  Stopping medicines that are known to cause neutropenia.  Correcting nutritional deficiencies by eating green vegetables to supply folic acid and taking vitamin B supplements.  Stopping exposure to pesticides if your neutropenia is related to pesticide exposure.  Taking a blood growth factor called sargramostim, pegfilgrastim, or filgrastim if you are undergoing chemotherapy for cancer. This stimulates white blood cell production.  Removal of the spleen if you have Felty's syndrome and have repeated infections. HOME CARE INSTRUCTIONS   Follow your caregiver's instructions about when you need to have blood work done.  Wash your hands often. Make sure others who come in contact with you also wash their hands.  Wash raw fruits and vegetables before eating them. They can carry bacteria and fungi.  Avoid people with colds or spreadable (contagious) diseases (chickenpox, herpes zoster, influenza).  Avoid large crowds.  Avoid construction areas. The dust can release fungus into the air.  Be cautious around children in daycare or school environments.  Take care of your respiratory system by coughing and deep breathing.  Bathe daily.  Protect your skin from cuts and   burns.  Do not work in the garden or with flowers and plants.  Care for the mouth before and after meals by brushing with a soft toothbrush. If you have  mucositis, do not use mouthwash. Mouthwash contains alcohol and can dry out the mouth even more.  Clean the area between the genitals and the anus (perineal area) after urination and bowel movements. Women need to wipe from front to back.  Use a water soluble lubricant during sexual intercourse and practice good hygiene after. Do not have intercourse if you are severely neutropenic. Check with your caregiver for guidelines.  Exercise daily as tolerated.  Avoid people who were vaccinated with a live vaccine in the past 30 days. You should not receive live vaccines (polio, typhoid).  Do not provide direct care for pets. Avoid animal droppings. Do not clean litter boxes and bird cages.  Do not share food utensils.  Do not use tampons, enemas, or rectal suppositories unless directed by your caregiver.  Use an electric razor to remove hair.  Wash your hands after handling magazines, letters, and newspapers. SEEK IMMEDIATE MEDICAL CARE IF:   You have a fever.  You have chills or start to shake.  You feel nauseous or vomit.  You develop mouth sores.  You develop aches and pains.  You have redness and swelling around open wounds.  Your skin is warm to the touch.  You have pus coming from your wounds.  You develop swollen lymph nodes.  You feel weak or fatigued.  You develop red streaks on the skin. MAKE SURE YOU:  Understand these instructions.  Will watch your condition.  Will get help right away if you are not doing well or get worse. Document Released: 09/22/2001 Document Revised: 06/25/2011 Document Reviewed: 10/20/2010 [employer] Patient Information 2014 [employer].  

## 2012-09-05 NOTE — Progress Notes (Signed)
Hold treatment for ANC 0.9 per Dr. Truett Perna. Neutropenic precautions discussed, patient verbalized understanding. Neutropenic precaution hand out given.

## 2012-09-10 ENCOUNTER — Telehealth: Payer: Self-pay | Admitting: Dietician

## 2012-09-10 NOTE — Telephone Encounter (Signed)
Brief Outpatient Oncology Nutrition Note  Patient has been identified to be at risk on malnutrition screen.  Wt Readings from Last 10 Encounters:  08/29/12 181 lb 12.8 oz (82.464 kg)  08/06/12 184 lb 9.6 oz (83.734 kg)  08/06/12 185 lb 3.2 oz (84.006 kg)  08/01/12 183 lb 6.4 oz (83.19 kg)  07/18/12 182 lb 14.4 oz (82.963 kg)  07/10/12 189 lb 9.6 oz (86.002 kg)  07/08/12 188 lb 9.6 oz (85.548 kg)  06/04/12 201 lb 14.4 oz (91.581 kg)  05/20/12 220 lb (99.791 kg)  05/09/12 217 lb 6.4 oz (98.612 kg)     Patient with cancer of unknown primary.  [facility] seen [facility] on 4/23.  Current difficulty with taste alterations but patient reports rinsing mouth with salt water and mouthwash and taste is improving.  Thinks that he is gaining weight.  Patient to call [facility] with any questions.  Oran Rein, RD, LDN

## 2012-09-14 ENCOUNTER — Other Ambulatory Visit: Payer: Self-pay | Admitting: Oncology

## 2012-09-15 ENCOUNTER — Telehealth: Payer: Self-pay | Admitting: Dietician

## 2012-09-19 ENCOUNTER — Ambulatory Visit (HOSPITAL_BASED_OUTPATIENT_CLINIC_OR_DEPARTMENT_OTHER): Payer: 59

## 2012-09-19 ENCOUNTER — Telehealth: Payer: Self-pay | Admitting: Oncology

## 2012-09-19 ENCOUNTER — Other Ambulatory Visit (HOSPITAL_BASED_OUTPATIENT_CLINIC_OR_DEPARTMENT_OTHER): Payer: 59

## 2012-09-19 ENCOUNTER — Ambulatory Visit (HOSPITAL_BASED_OUTPATIENT_CLINIC_OR_DEPARTMENT_OTHER): Payer: 59 | Admitting: Oncology

## 2012-09-19 ENCOUNTER — Telehealth: Payer: Self-pay | Admitting: *Deleted

## 2012-09-19 VITALS — BP 104/66 | HR 107 | Resp 18 | Ht 75.0 in | Wt 186.2 lb

## 2012-09-19 DIAGNOSIS — C801 Malignant (primary) neoplasm, unspecified: Secondary | ICD-10-CM

## 2012-09-19 DIAGNOSIS — R911 Solitary pulmonary nodule: Secondary | ICD-10-CM

## 2012-09-19 DIAGNOSIS — C7952 Secondary malignant neoplasm of bone marrow: Secondary | ICD-10-CM

## 2012-09-19 DIAGNOSIS — D371 Neoplasm of uncertain behavior of stomach: Secondary | ICD-10-CM

## 2012-09-19 DIAGNOSIS — Z5111 Encounter for antineoplastic chemotherapy: Secondary | ICD-10-CM

## 2012-09-19 DIAGNOSIS — C7951 Secondary malignant neoplasm of bone: Secondary | ICD-10-CM

## 2012-09-19 LAB — COMPREHENSIVE METABOLIC PANEL (CC13)
ALT: 10 U/L (ref 0–55)
AST: 18 U/L (ref 5–34)
Calcium: 9.2 mg/dL (ref 8.4–10.4)
Chloride: 103 mEq/L (ref 98–107)
Creatinine: 0.9 mg/dL (ref 0.7–1.3)
Potassium: 4.5 mEq/L (ref 3.5–5.1)
Sodium: 138 mEq/L (ref 136–145)

## 2012-09-19 LAB — CBC WITH DIFFERENTIAL/PLATELET
BASO%: 1 % (ref 0.0–2.0)
LYMPH%: 25.1 % (ref 14.0–49.0)
MCHC: 32.6 g/dL (ref 32.0–36.0)
MONO#: 0.5 10*3/uL (ref 0.1–0.9)
RBC: 4.65 10*6/uL (ref 4.20–5.82)
RDW: 25 % — ABNORMAL HIGH (ref 11.0–14.6)
WBC: 4.2 10*3/uL (ref 4.0–10.3)
lymph#: 1.1 10*3/uL (ref 0.9–3.3)
nRBC: 0 % (ref 0–0)

## 2012-09-19 MED ORDER — SODIUM CHLORIDE 0.9 % IV SOLN
700.0000 mg/m2 | Freq: Once | INTRAVENOUS | Status: AC
  Administered 2012-09-19: 1482 mg via INTRAVENOUS
  Filled 2012-09-19: qty 39.02

## 2012-09-19 MED ORDER — POTASSIUM CHLORIDE 2 MEQ/ML IV SOLN
Freq: Once | INTRAVENOUS | Status: AC
  Administered 2012-09-19: 11:00:00 via INTRAVENOUS
  Filled 2012-09-19: qty 10

## 2012-09-19 MED ORDER — SODIUM CHLORIDE 0.9 % IV SOLN
50.0000 mg/m2 | Freq: Once | INTRAVENOUS | Status: AC
  Administered 2012-09-19: 107 mg via INTRAVENOUS
  Filled 2012-09-19: qty 107

## 2012-09-19 MED ORDER — SODIUM CHLORIDE 0.9 % IV SOLN
150.0000 mg | Freq: Once | INTRAVENOUS | Status: AC
  Administered 2012-09-19: 150 mg via INTRAVENOUS
  Filled 2012-09-19: qty 5

## 2012-09-19 MED ORDER — SODIUM CHLORIDE 0.9 % IV SOLN
Freq: Once | INTRAVENOUS | Status: AC
  Administered 2012-09-19: 11:00:00 via INTRAVENOUS

## 2012-09-19 MED ORDER — ONDANSETRON HCL 4 MG PO TABS: 4.0000 mg | ORAL_TABLET | Freq: Two times a day (BID) | ORAL | Status: DC | PRN

## 2012-09-19 MED ORDER — PALONOSETRON HCL INJECTION 0.25 MG/5ML
0.2500 mg | Freq: Once | INTRAVENOUS | Status: AC
  Administered 2012-09-19: 0.25 mg via INTRAVENOUS

## 2012-09-19 MED ORDER — DEXAMETHASONE SODIUM PHOSPHATE 20 MG/5ML IJ SOLN
12.0000 mg | Freq: Once | INTRAMUSCULAR | Status: AC
  Administered 2012-09-19: 12 mg via INTRAVENOUS

## 2012-09-19 NOTE — Telephone Encounter (Signed)
Per staff phone call and POF I have schedueld appts.  JMW  

## 2012-09-19 NOTE — Patient Instructions (Addendum)
[facility] Discharge Instructions for Patients Receiving Chemotherapy  Today you received the following chemotherapy agents : Gemzar/ Cisplatin  To help prevent nausea and vomiting after your treatment, we encourage you to take your nausea medication : Zofran  If you develop nausea and vomiting that is not controlled by your nausea medication, call the clinic.   BELOW ARE SYMPTOMS THAT SHOULD BE REPORTED IMMEDIATELY:  *FEVER GREATER THAN 100.5 F  *CHILLS WITH OR WITHOUT FEVER  NAUSEA AND VOMITING THAT IS NOT CONTROLLED WITH YOUR NAUSEA MEDICATION  *UNUSUAL SHORTNESS OF BREATH  *UNUSUAL BRUISING OR BLEEDING  TENDERNESS IN MOUTH AND THROAT WITH OR WITHOUT PRESENCE OF ULCERS  *URINARY PROBLEMS  *BOWEL PROBLEMS  UNUSUAL RASH Items with * indicate a potential emergency and should be followed up as soon as possible.  Feel free to call the clinic you have any questions or concerns. The clinic phone number is [phone].

## 2012-09-19 NOTE — Progress Notes (Signed)
Patient verbalized interest in return to work in Fall or Winter 2014 and asking for MD opinion. 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 Reports that promethazine tends to make his nausea worse and last longer. No longer wishes to take this and requested nurse document this as an intolerance on his allergy list.

## 2012-09-19 NOTE — Progress Notes (Signed)
   [facility]    OFFICE PROGRESS NOTE   INTERVAL HISTORY:   Justin Henson returns as scheduled.Justin Henson began cycle 3 of chemotherapy on 08/29/2012. Justin Henson tolerated chemotherapy well. No neuropathy symptoms. Day 8 chemotherapy was held secondary to neutropenia. Justin Henson denies pain. Justin Henson has intermittent nausea after eating. Justin Henson is is now ambulatory in the home. His food intake is still limited by altered taste.  Objective:  Vital signs in last 24 hours:  Blood pressure 104/66, pulse 107, resp. rate 18, height 6\' 3"  (1.905 m), weight 186 lb 3.2 oz (84.46 kg), SpO2 100.00%.    HEENT: no thrush or ulcers Resp: lungs clear bilaterally Cardio: regular rate and rhythm GI: no hepatomegaly, nontender Vascular: no leg edema   Lab Results:  Lab Results  Component Value Date   WBC 4.2 09/19/2012   HGB 12.5* 09/19/2012   HCT 38.3* 09/19/2012   MCV 82.4 09/19/2012   PLT 296 09/19/2012  ANC 2.5    Medications: I have reviewed the patient's current medications.  Assessment/Plan: 1.Destructive lesion in the proximal right femur, status post intramedullary rodding of the right femur 04/23/2011, the pathology confirmed metastatic carcinoma. The immunohistochemical profile is not consistent with colon cancer.  - Staging CT evaluation confirming a transverse colon mass, multiple small lung nodules, a lytic lesion at the sternum, and an expansile lesion at the proximal right femur  -Staging PET scan 05/22/2012 with 2 hypermetabolic left liver lesions and multiple bone metastases. Hypermetabolic transverse colon lesion. No apparent primary tumor site.  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  2. History ofRed cell microcytosis - 3. Pain secondary to the right femur lesion -improved.  4. Transverse colon mass-status post  a colonoscopy 04/25/2012 confirming a transverse colon mass, biopsy consistent with multiple fragments of a tubular adenoma.  5. Upper endoscopy 04/30/2012 with no evidence of malignancy.  6. Syncope event 05/05/2012-negative noncontrast brain CT 05/05/2012.  7. Pain at the right side of the skull and right year with associated "dizziness "-March 2014. No rash to suggest a zoster infection. Negative brain MRI. His symptoms have resolved    Disposition:  Justin Henson has completed 3 cycles of gemcitabine. [name] continues to tolerate the chemotherapy well. The plan is to proceed with cycle 4 today. The gemcitabine will be dose reduced with this. His performance status is much improved.  Justin Henson will undergo a restaging PET scan prior to a return office visit on 10/10/2012.   Thornton Papas, MD  09/19/2012  9:43 PM

## 2012-09-19 NOTE — Telephone Encounter (Signed)
Gave pt appt for lab, MD and chemo for June and July 2014

## 2012-09-26 ENCOUNTER — Ambulatory Visit: Payer: 59

## 2012-09-26 ENCOUNTER — Encounter: Payer: Self-pay | Admitting: Oncology

## 2012-09-26 ENCOUNTER — Other Ambulatory Visit (HOSPITAL_BASED_OUTPATIENT_CLINIC_OR_DEPARTMENT_OTHER): Payer: 59 | Admitting: Lab

## 2012-09-26 DIAGNOSIS — C801 Malignant (primary) neoplasm, unspecified: Secondary | ICD-10-CM

## 2012-09-26 LAB — CBC WITH DIFFERENTIAL/PLATELET
Basophils Absolute: 0.1 10*3/uL (ref 0.0–0.1)
Eosinophils Absolute: 0 10*3/uL (ref 0.0–0.5)
HGB: 11.9 g/dL — ABNORMAL LOW (ref 13.0–17.1)
LYMPH%: 44.9 % (ref 14.0–49.0)
MCV: 83.5 fL (ref 79.3–98.0)
MONO%: 5.1 % (ref 0.0–14.0)
NEUT#: 0.9 10*3/uL — ABNORMAL LOW (ref 1.5–6.5)
NEUT%: 43.9 % (ref 39.0–75.0)
Platelets: 175 10*3/uL (ref 140–400)
RBC: 4.3 10*6/uL (ref 4.20–5.82)

## 2012-09-26 NOTE — Progress Notes (Signed)
1610 Per  Dr Darrold Span, MD on call,  hold tx today d/t ANC @ 0.9. Patient informed and Neutropenic education given. Patient states no fevers, "some upset stomach for a few days", no other complaints. Patient verbalized understanding, knows to call MD with any questions or concerns. AVS provided.

## 2012-09-26 NOTE — Patient Instructions (Signed)
Neutropenia Neutropenia is a condition that occurs when the level of a certain type of white blood cell (neutrophil) in your body becomes lower than normal. Neutrophils are made in the bone marrow and fight infections. These cells protect against bacteria and viruses. The fewer neutrophils you have, and the longer your body remains without them, the greater your risk of getting a severe infection becomes. CAUSES  The cause of neutropenia may be hard to determine. However, it is usually due to 3 main problems:   Decreased production of neutrophils. This may be due to:  Certain medicines such as chemotherapy.  Genetic problems.  Cancer.  Radiation treatments.  Vitamin deficiency.  Some pesticides.  Increased destruction of neutrophils. This may be due to:  Overwhelming infections.  Hemolytic anemia. This is when the body destroys its own blood cells.  Chemotherapy.  Neutrophils moving to areas of the body where they cannot fight infections. This may be due to:  Dialysis procedures.  Conditions where the spleen becomes enlarged. Neutrophils are held in the spleen and are not available to the rest of the body.  Overwhelming infections. The neutrophils are held in the area of the infection and are not available to the rest of the body. SYMPTOMS  There are no specific symptoms of neutropenia. The lack of neutrophils can result in an infection, and an infection can cause various problems. DIAGNOSIS  Diagnosis is made by a blood test. A complete blood count is performed. The normal level of neutrophils in human blood differs with age and race. Infants have lower counts than older children and adults. African Americans have lower counts than Caucasians or Asians. The average adult level is 1500 cells/mm3 of blood. Neutrophil counts are interpreted as follows:  Greater than 1000 cells/mm3 gives normal protection against infection.  500 to 1000 cells/mm3 gives an increased risk for  infection.  200 to 500 cells/mm3 is a greater risk for severe infection.  Lower than 200 cells/mm3 is a marked risk of infection. This may require hospitalization and treatment with antibiotic medicines. TREATMENT  Treatment depends on the underlying cause, severity, and presence of infections or symptoms. It also depends on your health. Your caregiver will discuss the treatment plan with you. Mild cases are often easily treated and have a good outcome. Preventative measures may also be started to limit your risk of infections. Treatment can include:  Taking antibiotics.  Stopping medicines that are known to cause neutropenia.  Correcting nutritional deficiencies by eating green vegetables to supply folic acid and taking vitamin B supplements.  Stopping exposure to pesticides if your neutropenia is related to pesticide exposure.  Taking a blood growth factor called sargramostim, pegfilgrastim, or filgrastim if you are undergoing chemotherapy for cancer. This stimulates white blood cell production.  Removal of the spleen if you have Felty's syndrome and have repeated infections. HOME CARE INSTRUCTIONS   Follow your caregiver's instructions about when you need to have blood work done.  Wash your hands often. Make sure others who come in contact with you also wash their hands.  Wash raw fruits and vegetables before eating them. They can carry bacteria and fungi.  Avoid people with colds or spreadable (contagious) diseases (chickenpox, herpes zoster, influenza).  Avoid large crowds.  Avoid construction areas. The dust can release fungus into the air.  Be cautious around children in daycare or school environments.  Take care of your respiratory system by coughing and deep breathing.  Bathe daily.  Protect your skin from cuts and   burns.  Do not work in the garden or with flowers and plants.  Care for the mouth before and after meals by brushing with a soft toothbrush. If you have  mucositis, do not use mouthwash. Mouthwash contains alcohol and can dry out the mouth even more.  Clean the area between the genitals and the anus (perineal area) after urination and bowel movements. Women need to wipe from front to back.  Use a water soluble lubricant during sexual intercourse and practice good hygiene after. Do not have intercourse if you are severely neutropenic. Check with your caregiver for guidelines.  Exercise daily as tolerated.  Avoid people who were vaccinated with a live vaccine in the past 30 days. You should not receive live vaccines (polio, typhoid).  Do not provide direct care for pets. Avoid animal droppings. Do not clean litter boxes and bird cages.  Do not share food utensils.  Do not use tampons, enemas, or rectal suppositories unless directed by your caregiver.  Use an electric razor to remove hair.  Wash your hands after handling magazines, letters, and newspapers. SEEK IMMEDIATE MEDICAL CARE IF:   You have a fever.  You have chills or start to shake.  You feel nauseous or vomit.  You develop mouth sores.  You develop aches and pains.  You have redness and swelling around open wounds.  Your skin is warm to the touch.  You have pus coming from your wounds.  You develop swollen lymph nodes.  You feel weak or fatigued.  You develop red streaks on the skin. MAKE SURE YOU:  Understand these instructions.  Will watch your condition.  Will get help right away if you are not doing well or get worse. Document Released: 09/22/2001 Document Revised: 06/25/2011 Document Reviewed: 10/20/2010 [employer] Patient Information 2014 [employer].  

## 2012-10-01 ENCOUNTER — Telehealth: Payer: Self-pay | Admitting: *Deleted

## 2012-10-01 NOTE — Telephone Encounter (Signed)
Per Dr. Truett Perna : Will hold on further chemo until PET scan results are seen. OK to schedule his dental work now-counts should be adequate. Will see 10/10/12 as scheduled to plan further treatment.  He will make patient and wife aware.

## 2012-10-07 ENCOUNTER — Encounter (HOSPITAL_COMMUNITY)
Admission: RE | Admit: 2012-10-07 | Discharge: 2012-10-07 | Disposition: A | Discharge status: Home / Self Care | Payer: 59 | Source: Ambulatory Visit | Attending: Oncology | Admitting: Oncology

## 2012-10-07 DIAGNOSIS — C801 Malignant (primary) neoplasm, unspecified: Secondary | ICD-10-CM | POA: Insufficient documentation

## 2012-10-07 LAB — GLUCOSE, CAPILLARY: Glucose-Capillary: 91 mg/dL (ref 70–99)

## 2012-10-07 MED ORDER — FLUDEOXYGLUCOSE F - 18 (FDG) INJECTION
18.6000 | Freq: Once | INTRAVENOUS | Status: AC | PRN
  Administered 2012-10-07: 18.6 via INTRAVENOUS

## 2012-10-08 ENCOUNTER — Other Ambulatory Visit: Payer: Self-pay | Admitting: Oncology

## 2012-10-10 ENCOUNTER — Ambulatory Visit (HOSPITAL_BASED_OUTPATIENT_CLINIC_OR_DEPARTMENT_OTHER): Payer: 59 | Admitting: Oncology

## 2012-10-10 ENCOUNTER — Telehealth: Payer: Self-pay | Admitting: Oncology

## 2012-10-10 ENCOUNTER — Other Ambulatory Visit: Payer: Self-pay | Admitting: Oncology

## 2012-10-10 ENCOUNTER — Other Ambulatory Visit (HOSPITAL_BASED_OUTPATIENT_CLINIC_OR_DEPARTMENT_OTHER): Payer: 59 | Admitting: Lab

## 2012-10-10 ENCOUNTER — Telehealth: Payer: Self-pay | Admitting: *Deleted

## 2012-10-10 ENCOUNTER — Ambulatory Visit (HOSPITAL_BASED_OUTPATIENT_CLINIC_OR_DEPARTMENT_OTHER): Payer: 59

## 2012-10-10 VITALS — BP 103/63 | HR 96 | Temp 97.6°F | Resp 20 | Ht 75.0 in | Wt 190.6 lb

## 2012-10-10 DIAGNOSIS — C801 Malignant (primary) neoplasm, unspecified: Secondary | ICD-10-CM

## 2012-10-10 DIAGNOSIS — C7951 Secondary malignant neoplasm of bone: Secondary | ICD-10-CM

## 2012-10-10 DIAGNOSIS — Z5111 Encounter for antineoplastic chemotherapy: Secondary | ICD-10-CM

## 2012-10-10 LAB — COMPREHENSIVE METABOLIC PANEL (CC13)
Albumin: 3.2 g/dL — ABNORMAL LOW (ref 3.5–5.0)
BUN: 13.1 mg/dL (ref 7.0–26.0)
CO2: 25 mEq/L (ref 22–29)
Glucose: 101 mg/dl (ref 70–140)
Potassium: 4.6 mEq/L (ref 3.5–5.1)
Sodium: 140 mEq/L (ref 136–145)
Total Bilirubin: 0.32 mg/dL (ref 0.20–1.20)
Total Protein: 6.4 g/dL (ref 6.4–8.3)

## 2012-10-10 LAB — CBC WITH DIFFERENTIAL/PLATELET
Basophils Absolute: 0.1 10*3/uL (ref 0.0–0.1)
EOS%: 3.2 % (ref 0.0–7.0)
HCT: 35.8 % — ABNORMAL LOW (ref 38.4–49.9)
HGB: 11.7 g/dL — ABNORMAL LOW (ref 13.0–17.1)
LYMPH%: 23.7 % (ref 14.0–49.0)
MCH: 28.6 pg (ref 27.2–33.4)
MCHC: 32.7 g/dL (ref 32.0–36.0)
MCV: 87.5 fL (ref 79.3–98.0)
NEUT%: 56.6 % (ref 39.0–75.0)
Platelets: 356 10*3/uL (ref 140–400)
lymph#: 0.8 10*3/uL — ABNORMAL LOW (ref 0.9–3.3)

## 2012-10-10 MED ORDER — SODIUM CHLORIDE 0.9 % IV SOLN
Freq: Once | INTRAVENOUS | Status: AC
  Administered 2012-10-10: 10:00:00 via INTRAVENOUS

## 2012-10-10 MED ORDER — SODIUM CHLORIDE 0.9 % IV SOLN
40.0000 mg/m2 | Freq: Once | INTRAVENOUS | Status: AC
  Administered 2012-10-10: 85 mg via INTRAVENOUS
  Filled 2012-10-10: qty 85

## 2012-10-10 MED ORDER — POTASSIUM CHLORIDE 2 MEQ/ML IV SOLN
Freq: Once | INTRAVENOUS | Status: AC
  Administered 2012-10-10: 11:00:00 via INTRAVENOUS
  Filled 2012-10-10: qty 10

## 2012-10-10 MED ORDER — DEXAMETHASONE SODIUM PHOSPHATE 20 MG/5ML IJ SOLN
12.0000 mg | Freq: Once | INTRAMUSCULAR | Status: AC
  Administered 2012-10-10: 12 mg via INTRAVENOUS

## 2012-10-10 MED ORDER — PALONOSETRON HCL INJECTION 0.25 MG/5ML
0.2500 mg | Freq: Once | INTRAVENOUS | Status: AC
  Administered 2012-10-10: 0.25 mg via INTRAVENOUS

## 2012-10-10 MED ORDER — GEMCITABINE HCL CHEMO INJECTION 1 GM/26.3ML
630.0000 mg/m2 | Freq: Once | INTRAVENOUS | Status: AC
  Administered 2012-10-10: 1330 mg via INTRAVENOUS
  Filled 2012-10-10: qty 35

## 2012-10-10 MED ORDER — SODIUM CHLORIDE 0.9 % IV SOLN
150.0000 mg | Freq: Once | INTRAVENOUS | Status: AC
  Administered 2012-10-10: 150 mg via INTRAVENOUS
  Filled 2012-10-10: qty 5

## 2012-10-10 NOTE — Telephone Encounter (Signed)
Per staff phone call and POF I have schedueld appts.  JMW  

## 2012-10-10 NOTE — Patient Instructions (Signed)
[facility] Discharge Instructions for Patients Receiving Chemotherapy  Today you received the following chemotherapy agents Gemzar/Cisplatin  To help prevent nausea and vomiting after your treatment, we encourage you to take your nausea medication as needed   If you develop nausea and vomiting that is not controlled by your nausea medication, call the clinic.   BELOW ARE SYMPTOMS THAT SHOULD BE REPORTED IMMEDIATELY:  *FEVER GREATER THAN 100.5 F  *CHILLS WITH OR WITHOUT FEVER  NAUSEA AND VOMITING THAT IS NOT CONTROLLED WITH YOUR NAUSEA MEDICATION  *UNUSUAL SHORTNESS OF BREATH  *UNUSUAL BRUISING OR BLEEDING  TENDERNESS IN MOUTH AND THROAT WITH OR WITHOUT PRESENCE OF ULCERS  *URINARY PROBLEMS  *BOWEL PROBLEMS  UNUSUAL RASH Items with * indicate a potential emergency and should be followed up as soon as possible.  Feel free to call the clinic you have any questions or concerns. The clinic phone number is [phone].    

## 2012-10-10 NOTE — Telephone Encounter (Signed)
Gave pt appt for lab, chemo and MD for July 2014

## 2012-10-10 NOTE — Progress Notes (Signed)
   [facility]    OFFICE PROGRESS NOTE   INTERVAL HISTORY:   He returns as scheduled. No pain. He reports anorexia for several days after chemotherapy. No nausea/vomiting or neuropathy symptoms. He is more ambulatory.  Objective:  Vital signs in last 24 hours:  Blood pressure 103/63, pulse 96, temperature 97.6 F (36.4 C), temperature source Oral, resp. rate 20, height 6\' 3"  (1.905 m), weight 190 lb 9.6 oz (86.456 kg).    HEENT: No thrush or ulcers Resp: Lungs clear bilaterally Cardio: Regular rate and rhythm GI: No hepatomegaly Vascular: Trace pitting edema at the foot bilaterally   Lab Results:  Lab Results  Component Value Date   WBC 3.2* 10/10/2012   HGB 11.7* 10/10/2012   HCT 35.8* 10/10/2012   MCV 87.5 10/10/2012   PLT 356 10/10/2012   ANC 1.8  X-rays: Restaging PET scan 10/07/2012-compared to 05/22/2012, the hypermetabolism in the left hepatic lobe has improved. No well-defined CT abnormality in this area. Soft tissue fullness in hypermetabolism was again identified in the transverse colon. Improvement in osseous hypermetabolism. Similar scattered nodules in the lungs below the resolution of PET. New pathologic fracture at the left sixth rib. Increased visualization of the proximal femoral metastasis favored to reflect healing.   Medications: I have reviewed the patient's current medications.  Assessment/Plan: 1.Destructive lesion in the proximal right femur, status post intramedullary rodding of the right femur 04/23/2011, the pathology confirmed metastatic carcinoma. The immunohistochemical profile is not consistent with colon cancer.  - Staging CT evaluation confirming a transverse colon mass, multiple small lung nodules, a lytic lesion at the sternum, and an expansile lesion at the proximal right femur  -Staging PET scan 05/22/2012 with 2 hypermetabolic left liver lesions and multiple bone metastases. Hypermetabolic transverse colon lesion. No  apparent primary tumor site.  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  -Restaging PET scan 10/07/2012 after 4 cycles of gemcitabine/cisplatin confirmed improvement in the liver/bone metastases 2. History of Red cell microcytosis -  3. Pain secondary to the right femur lesion -improved.  4. Transverse colon mass-status post a colonoscopy 04/25/2012 confirming a transverse colon mass, biopsy consistent with multiple fragments of a tubular adenoma.  5. Upper endoscopy 04/30/2012 with no evidence of malignancy.  6. Syncope event 05/05/2012-negative noncontrast brain CT 05/05/2012.  7. Pain at the right side of the skull and right year with associated "dizziness "-March 2014. No rash to suggest a zoster infection. Negative brain MRI. His symptoms have resolved    Disposition:  He has completed 4 cycles of gemcitabine/cisplatin. His performance status is improved and the restaging PET scan indicates a clinical response to therapy. The plan is to proceed with at least 2 more cycles of gemcitabine/cisplatin. The day 8 gemcitabine was held with cycle 3 and 4 secondary to neutropenia. Chemotherapy will be dose reduced beginning with cycle 5 today. He will return for an office visit in 3 weeks.   Thornton Papas, MD  10/10/2012  8:55 AM

## 2012-10-16 ENCOUNTER — Ambulatory Visit: Payer: 59

## 2012-10-16 ENCOUNTER — Other Ambulatory Visit: Payer: Self-pay

## 2012-10-16 ENCOUNTER — Encounter (HOSPITAL_COMMUNITY): Payer: Self-pay | Admitting: *Deleted

## 2012-10-16 ENCOUNTER — Emergency Department (HOSPITAL_COMMUNITY)
Admission: EM | Admit: 2012-10-16 | Discharge: 2012-10-16 | Disposition: A | Discharge status: Home / Self Care | Payer: 59 | Attending: Emergency Medicine | Admitting: Emergency Medicine

## 2012-10-16 ENCOUNTER — Other Ambulatory Visit (HOSPITAL_BASED_OUTPATIENT_CLINIC_OR_DEPARTMENT_OTHER): Payer: 59 | Admitting: Lab

## 2012-10-16 DIAGNOSIS — E869 Volume depletion, unspecified: Secondary | ICD-10-CM | POA: Insufficient documentation

## 2012-10-16 DIAGNOSIS — C801 Malignant (primary) neoplasm, unspecified: Secondary | ICD-10-CM

## 2012-10-16 DIAGNOSIS — Z923 Personal history of irradiation: Secondary | ICD-10-CM | POA: Insufficient documentation

## 2012-10-16 DIAGNOSIS — Z8739 Personal history of other diseases of the musculoskeletal system and connective tissue: Secondary | ICD-10-CM | POA: Insufficient documentation

## 2012-10-16 DIAGNOSIS — I959 Hypotension, unspecified: Secondary | ICD-10-CM | POA: Insufficient documentation

## 2012-10-16 DIAGNOSIS — Z79899 Other long term (current) drug therapy: Secondary | ICD-10-CM | POA: Insufficient documentation

## 2012-10-16 DIAGNOSIS — C240 Malignant neoplasm of extrahepatic bile duct: Secondary | ICD-10-CM | POA: Insufficient documentation

## 2012-10-16 DIAGNOSIS — Z9221 Personal history of antineoplastic chemotherapy: Secondary | ICD-10-CM | POA: Insufficient documentation

## 2012-10-16 LAB — CBC WITH DIFFERENTIAL/PLATELET
Basophils Absolute: 0 10*3/uL (ref 0.0–0.1)
Eosinophils Absolute: 0 10*3/uL (ref 0.0–0.5)
HCT: 31.7 % — ABNORMAL LOW (ref 39.0–52.0)
HGB: 11.9 g/dL — ABNORMAL LOW (ref 13.0–17.1)
Hemoglobin: 10.5 g/dL — ABNORMAL LOW (ref 13.0–17.0)
Lymphocytes Relative: 28 % (ref 12–46)
Lymphs Abs: 0.5 10*3/uL — ABNORMAL LOW (ref 0.7–4.0)
MONO#: 0.1 10*3/uL (ref 0.1–0.9)
Monocytes Absolute: 0.1 10*3/uL (ref 0.1–1.0)
Monocytes Relative: 4 % (ref 3–12)
NEUT#: 0.7 10*3/uL — ABNORMAL LOW (ref 1.5–6.5)
Neutro Abs: 1 10*3/uL — ABNORMAL LOW (ref 1.7–7.7)
RBC: 4.12 10*6/uL — ABNORMAL LOW (ref 4.20–5.82)
RBC: 3.62 MIL/uL — ABNORMAL LOW (ref 4.22–5.81)
RDW: 19.2 % — ABNORMAL HIGH (ref 11.0–14.6)
WBC: 1.4 10*3/uL — ABNORMAL LOW (ref 4.0–10.3)
WBC: 1.6 10*3/uL — ABNORMAL LOW (ref 4.0–10.5)
nRBC: 0 % (ref 0–0)

## 2012-10-16 LAB — BASIC METABOLIC PANEL
BUN: 14 mg/dL (ref 6–23)
CO2: 27 mEq/L (ref 19–32)
Chloride: 104 mEq/L (ref 96–112)
Creatinine, Ser: 0.89 mg/dL (ref 0.50–1.35)
Glucose, Bld: 95 mg/dL (ref 70–99)

## 2012-10-16 MED ORDER — SODIUM CHLORIDE 0.9 % IV BOLUS (SEPSIS)
1000.0000 mL | Freq: Once | INTRAVENOUS | Status: AC
  Administered 2012-10-16: 1000 mL via INTRAVENOUS

## 2012-10-16 MED ORDER — LORAZEPAM 2 MG/ML IJ SOLN
1.0000 mg | Freq: Once | INTRAMUSCULAR | Status: AC
  Administered 2012-10-16: 1 mg via INTRAVENOUS
  Filled 2012-10-16: qty 1

## 2012-10-16 NOTE — ED Notes (Signed)
Unable to draw blood from pt, RN has attempted as well as phlebotomy in ED. Hospital Phle[facility] called.

## 2012-10-16 NOTE — ED Notes (Signed)
Unable to obtain blood due to pt's agitation. Per MD ok to hold until pt is calmer.

## 2012-10-16 NOTE — ED Notes (Signed)
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 Brought to ED. Pt guest reports pt has not been feeling well last couple of days. Pt hypotensive at present.

## 2012-10-16 NOTE — ED Provider Notes (Signed)
History    CSN: [id number] Arrival date & time 10/16/12  7829  First MD Initiated Contact with Patient 10/16/12 [phone]     Chief Complaint  Patient presents with  . cancer pt, hypotension   . Weakness    HPI Patient was sent from the cancer center after he became weak while he was waiting to receive chemotherapy.  He became lightheaded and had a near syncopal episodes.  He did not lose consciousness.  No preceding palpitations or chest pain.  No shortness of breath.  The patient did not eat breakfast this morning because he thought he was going to eat breakfast after chemotherapy today.  For the past several days his had mild decreased oral intake.  No diarrhea.  No melena or hematochezia.  No fevers or chills.  His last chemotherapy was 6 days ago.  He states he does not like needles and is having difficulty with the nursing team tried to access his veins for blood and IV use.  He and his wife are requesting benzodiazepines which he normally gets prior to his chemotherapy.  Nothing worsens or improves the patient's symptoms.  Symptoms are mild in severity.  Past Medical History  Diagnosis Date  . Cancer 04/24/2012    lesions currently under diagnosis  . Shortness of breath     on exertion  . Arthritis   . Syncope   . Hypotension   . Radiation 05/15/12-06/04/12    completed treatment/30 gray in 10 fx's  . Liver mass    Past Surgical History  Procedure Laterality Date  . Femur im nail  04/22/2012    Procedure: INTRAMEDULLARY (IM) NAIL FEMORAL;  Surgeon: Harvie Junior, MD;  Location: [facility] OR;  Service: Orthopedics;  Laterality: Right;  PROPHYLACTIC  IM ROD RIGHT FEMUR   . Colonoscopy  04/25/2012    Procedure: COLONOSCOPY;  Surgeon: Petra Kuba, MD;  Location: [facility] ENDOSCOPY;  Service: Endoscopy;  Laterality: N/A;  . Esophagogastroduodenoscopy  04/30/2012    Procedure: ESOPHAGOGASTRODUODENOSCOPY (EGD);  Surgeon: Petra Kuba, MD;  Location: [facility] ENDOSCOPY;  Service: Endoscopy;  Laterality: N/A;    No family history on file. History  Substance Use Topics  . Smoking status: Never Smoker   . Smokeless tobacco: Never Used  . Alcohol Use: No    Review of Systems  All other systems reviewed and are negative.    Allergies  Promethazine  Home Medications   Current Outpatient Rx  Name  Route  Sig  Dispense  Refill  . acetaminophen (TYLENOL) 500 MG tablet   Oral   Take 1,000 mg by mouth every 6 (six) hours as needed for pain.         Marland Kitchen ALPRAZolam (XANAX) 0.5 MG tablet   Oral   Take 1 tablet (0.5 mg total) by mouth 3 (three) times daily as needed for sleep or anxiety.   60 tablet   1   . loratadine-pseudoephedrine (CLARITIN-D 12-HOUR) 5-120 MG per tablet   Oral   Take 1 tablet by mouth daily.          . methocarbamol (ROBAXIN) 500 MG tablet   Oral   Take 500 mg by mouth every 6 (six) hours as needed (muscle spasms).         . Multiple Vitamin (MULTIVITAMIN WITH MINERALS) TABS   Oral   Take 1 tablet by mouth daily.         . ondansetron (ZOFRAN) 4 MG tablet   Oral   Take  1 tablet (4 mg total) by mouth every 12 (twelve) hours as needed for nausea.   20 tablet   1   . Polyethylene Glycol 3350 (MIRALAX PO)   Oral   Take 17 g by mouth daily.          BP 90/57  Pulse 78  Temp(Src) 97.9 F (36.6 C) (Oral)  Resp 18  SpO2 98% Physical Exam  Nursing note and vitals reviewed. Constitutional: He is oriented to person, place, and time. He appears well-developed and well-nourished.  HENT:  Head: Normocephalic and atraumatic.  Dry mucous membranes  Eyes: EOM are normal.  Neck: Normal range of motion.  Cardiovascular: Normal rate, regular rhythm, normal heart sounds and intact distal pulses.   Pulmonary/Chest: Effort normal and breath sounds normal. No respiratory distress.  Abdominal: Soft. He exhibits no distension. There is no tenderness.  Genitourinary: Rectum normal.  Musculoskeletal: Normal range of motion.  Neurological: He is alert and  oriented to person, place, and time.  Skin: Skin is warm and dry.  Psychiatric: He has a normal mood and affect. Judgment normal.    ED Course  Procedures (including critical care time)   Date: 10/16/2012  Rate: 77  Rhythm: normal sinus rhythm  QRS Axis: normal  Intervals: normal  ST/T Wave abnormalities: normal  Conduction Disutrbances: none  Narrative Interpretation:   Old EKG Reviewed: No significant changes noted  Apiration of blood/fluid Performed by: Lyanne Co Consent obtained. Required items: required blood products, implants, devices, and special equipment available Patient identity confirmed: verbally with patient Time out: Immediately prior to procedure a "time out" was called to verify the correct patient, procedure, equipment, support staff and site/side marked as required. Preparation: Patient was prepped and draped in the usual sterile fashion. Patient tolerance: Patient tolerated the procedure well with no immediate complications.  Location of aspiration: right basilic vein (blood obtained).       Labs Reviewed  CBC WITH DIFFERENTIAL - Abnormal; Notable for the following:    WBC 1.6 (*)    RBC 3.62 (*)    Hemoglobin 10.5 (*)    HCT 31.7 (*)    RDW 19.2 (*)    Neutro Abs 1.0 (*)    Lymphs Abs 0.5 (*)    All other components within normal limits  BASIC METABOLIC PANEL - Abnormal; Notable for the following:    Potassium 5.3 (*)    GFR calc non Af Amer 87 (*)    All other components within normal limits  CULTURE, BLOOD (ROUTINE X 2)  CULTURE, BLOOD (ROUTINE X 2)  TROPONIN I  CG4 I-STAT (LACTIC ACID)   No results found. 1. Volume depletion     MDM   9:09 AM 100/64  11:56 AM Systolic blood pressure 111.  The patient feels much better at this time after IV fluids.  He would like to go home at this time.  Blood work is without significant abnormality.  There is a mild drop in his hemoglobin however he continues to deny melena and hematochezia.   Discharge home in good condition.  PCP and oncology followup.  He understands return to the ER for new or worsening symptoms.  I suspect this was due to not eating breakfast his morning decreased oral intake over the past 24-48 hours.  I have encouraged oral hydration at home.Marland Kitchen  Lyanne Co, MD 10/16/12 [phone]

## 2012-10-20 ENCOUNTER — Encounter: Payer: Self-pay | Admitting: Oncology

## 2012-10-22 LAB — CULTURE, BLOOD (ROUTINE X 2): Culture: NO GROWTH

## 2012-10-26 ENCOUNTER — Other Ambulatory Visit: Payer: Self-pay | Admitting: Oncology

## 2012-10-29 ENCOUNTER — Encounter: Payer: Self-pay | Admitting: Oncology

## 2012-10-29 ENCOUNTER — Telehealth: Payer: Self-pay | Admitting: *Deleted

## 2012-10-29 NOTE — Telephone Encounter (Signed)
Per Dr. Truett Perna : Will wait and examine him on 7/18 visit. Notified patient that since he is not febrile or having any neuro symptoms will monitor for now. Call for fever or any changes. He understands and agrees.

## 2012-10-29 NOTE — Telephone Encounter (Signed)
Called to follow up on complaint of headache-above right eye down to right ear-worse when he is reclined. Has a pressure feeling in his right ear with sinus pain and pressure. Has had this headache for last 3 days or so & had been treated off and on in past as child for ear infections. Saw Dr. Nicholos Johns on 10/24/12 and was examined, was told he had an inner ear infection, but no antibiotic was ordered for him. Is taking OTC Claritin D every am and prn Tylenol ES and has been taking 1/2 Amoxicillin daily that he has had on hand from prior infection in past (only has #5 pills) since Friday.  Denies any dizziness, visual disturbances, weakness, speech or any other neuro deficits related to this. No fever. Last MRI brain 07/03/12 was negative. Called and left message with Dr. Benjaman Pott nurse to confirm patient's assessment of his 7/11 visit diagnosis was accurate and to report he is calling our office looking for help. Dr. Truett Perna also made aware. Patient is aware he sees him on 10/31/12.

## 2012-10-30 NOTE — Telephone Encounter (Signed)
RECEIVED A TELEPHONE CALL FROM DR.READE'S OFFICE CONCERNING DR.SHERRILL'S NURSE, SUSAN COWARD'S CALL ON 10/29/12. THE ACTION TAKEN REPORT WAS FAXED TO THIS OFFICE AND SHOWN TO DR.SHERRILL. VERBAL ORDER AND READ BACK TO DR.SHERRILL- PT. WILL BE SEEN IN THIS OFFICE TOMORROW. GIVE THE REPORT TO [facility]. TANYA WAS GIVEN THE REPORT.

## 2012-10-31 ENCOUNTER — Other Ambulatory Visit (HOSPITAL_BASED_OUTPATIENT_CLINIC_OR_DEPARTMENT_OTHER): Payer: 59 | Admitting: Lab

## 2012-10-31 ENCOUNTER — Ambulatory Visit (HOSPITAL_BASED_OUTPATIENT_CLINIC_OR_DEPARTMENT_OTHER): Payer: 59 | Admitting: Oncology

## 2012-10-31 ENCOUNTER — Ambulatory Visit (HOSPITAL_BASED_OUTPATIENT_CLINIC_OR_DEPARTMENT_OTHER): Payer: 59

## 2012-10-31 ENCOUNTER — Telehealth: Payer: Self-pay | Admitting: Oncology

## 2012-10-31 VITALS — BP 104/65 | HR 99 | Temp 98.0°F | Resp 20 | Ht 75.0 in | Wt 200.1 lb

## 2012-10-31 DIAGNOSIS — C7951 Secondary malignant neoplasm of bone: Secondary | ICD-10-CM

## 2012-10-31 DIAGNOSIS — C7952 Secondary malignant neoplasm of bone marrow: Secondary | ICD-10-CM

## 2012-10-31 DIAGNOSIS — C801 Malignant (primary) neoplasm, unspecified: Secondary | ICD-10-CM

## 2012-10-31 DIAGNOSIS — Z5111 Encounter for antineoplastic chemotherapy: Secondary | ICD-10-CM

## 2012-10-31 LAB — CBC WITH DIFFERENTIAL/PLATELET
BASO%: 1.7 % (ref 0.0–2.0)
LYMPH%: 22.8 % (ref 14.0–49.0)
MCHC: 32.2 g/dL (ref 32.0–36.0)
MCV: 90.2 fL (ref 79.3–98.0)
MONO#: 0.5 10*3/uL (ref 0.1–0.9)
MONO%: 11.4 % (ref 0.0–14.0)
Platelets: 283 10*3/uL (ref 140–400)
RBC: 3.96 10*6/uL — ABNORMAL LOW (ref 4.20–5.82)
RDW: 18.2 % — ABNORMAL HIGH (ref 11.0–14.6)
WBC: 4 10*3/uL (ref 4.0–10.3)
nRBC: 0 % (ref 0–0)

## 2012-10-31 LAB — MAGNESIUM (CC13): Magnesium: 2 mg/dl (ref 1.5–2.5)

## 2012-10-31 LAB — COMPREHENSIVE METABOLIC PANEL (CC13)
Alkaline Phosphatase: 118 U/L (ref 40–150)
Glucose: 93 mg/dl (ref 70–140)
Sodium: 140 mEq/L (ref 136–145)
Total Bilirubin: 0.25 mg/dL (ref 0.20–1.20)
Total Protein: 6.1 g/dL — ABNORMAL LOW (ref 6.4–8.3)

## 2012-10-31 MED ORDER — PALONOSETRON HCL INJECTION 0.25 MG/5ML
0.2500 mg | Freq: Once | INTRAVENOUS | Status: AC
  Administered 2012-10-31: 0.25 mg via INTRAVENOUS

## 2012-10-31 MED ORDER — POTASSIUM CHLORIDE 2 MEQ/ML IV SOLN: Freq: Once | INTRAVENOUS | Status: DC

## 2012-10-31 MED ORDER — SODIUM CHLORIDE 0.9 % IV SOLN
630.0000 mg/m2 | Freq: Once | INTRAVENOUS | Status: AC
  Administered 2012-10-31: 1330 mg via INTRAVENOUS
  Filled 2012-10-31: qty 35

## 2012-10-31 MED ORDER — SODIUM CHLORIDE 0.9 % IV SOLN: Freq: Once | INTRAVENOUS | Status: DC

## 2012-10-31 MED ORDER — MANNITOL 25 % IV SOLN
INTRAVENOUS | Status: DC
  Administered 2012-10-31: 10:00:00 via INTRAVENOUS
  Filled 2012-10-31: qty 1000

## 2012-10-31 MED ORDER — DEXAMETHASONE SODIUM PHOSPHATE 20 MG/5ML IJ SOLN
12.0000 mg | Freq: Once | INTRAMUSCULAR | Status: AC
  Administered 2012-10-31: 12 mg via INTRAVENOUS

## 2012-10-31 MED ORDER — SODIUM CHLORIDE 0.9 % IV SOLN
40.0000 mg/m2 | Freq: Once | INTRAVENOUS | Status: AC
  Administered 2012-10-31: 85 mg via INTRAVENOUS
  Filled 2012-10-31: qty 85

## 2012-10-31 MED ORDER — SODIUM CHLORIDE 0.9 % IV SOLN
Freq: Once | INTRAVENOUS | Status: AC
  Administered 2012-10-31: 10:00:00 via INTRAVENOUS

## 2012-10-31 MED ORDER — SODIUM CHLORIDE 0.9 % IV SOLN
150.0000 mg | Freq: Once | INTRAVENOUS | Status: AC
  Administered 2012-10-31: 150 mg via INTRAVENOUS
  Filled 2012-10-31: qty 5

## 2012-10-31 NOTE — Progress Notes (Signed)
1300- Pt has 475 ml urine output pre-cisplatin.

## 2012-10-31 NOTE — Telephone Encounter (Signed)
gave pt appt for July and August 2014 lab,, chemo and MD

## 2012-10-31 NOTE — Patient Instructions (Addendum)
[facility] Discharge Instructions for Patients Receiving Chemotherapy  Today you received the following chemotherapy agents: Gemzar and Cisplatin.  To help prevent nausea and vomiting after your treatment, we encourage you to take your nausea medication.   If you develop nausea and vomiting that is not controlled by your nausea medication, call the clinic.   BELOW ARE SYMPTOMS THAT SHOULD BE REPORTED IMMEDIATELY:  *FEVER GREATER THAN 100.5 F  *CHILLS WITH OR WITHOUT FEVER  NAUSEA AND VOMITING THAT IS NOT CONTROLLED WITH YOUR NAUSEA MEDICATION  *UNUSUAL SHORTNESS OF BREATH  *UNUSUAL BRUISING OR BLEEDING  TENDERNESS IN MOUTH AND THROAT WITH OR WITHOUT PRESENCE OF ULCERS  *URINARY PROBLEMS  *BOWEL PROBLEMS  UNUSUAL RASH Items with * indicate a potential emergency and should be followed up as soon as possible.  Feel free to call the clinic you have any questions or concerns. The clinic phone number is [phone].    

## 2012-10-31 NOTE — Progress Notes (Signed)
   Justin Henson    OFFICE PROGRESS NOTE   INTERVAL HISTORY:   He began cycle 5 of chemotherapy on 10/10/2012. Day 8 chemotherapy was held secondary to neutropenia. He reports tolerating the chemotherapy well. No neuropathy symptoms. He has developed mild pain at the distal right thigh. He relates this to increased activity. He is gaining weight.  He recently developed discomfort at the right ear and was evaluated by Dr. Nicholos Johns. Dr. Nicholos Johns recommended he begin Mucinex. Justin Henson reports a history of chronic right ear infections. On 10/16/2012 he developed presyncope prior to planned chemotherapy. He was evaluated in the emergency room and diagnosed with volume depletion. He received IV fluids and felt better.   Objective:  Vital signs in last 24 hours:  Blood pressure 104/65, pulse 99, temperature 98 F (36.7 C), temperature source Oral, resp. rate 20, height 6\' 3"  (1.905 m), weight 200 lb 1.6 oz (90.765 kg).    HEENT: No thrush or ulcers Resp: Lungs clear bilaterally Cardio: Regular rate and rhythm GI: No hepatomegaly, nontender Vascular: Trace low leg/ankle edema bilaterally     Lab Results:  Lab Results  Component Value Date   WBC 4.0 10/31/2012   HGB 11.5* 10/31/2012   HCT 35.7* 10/31/2012   MCV 90.2 10/31/2012   PLT 283 10/31/2012   ANC 2.5    Medications: I have reviewed the patient's current medications.  Assessment/Plan: 1.Destructive lesion in the proximal right femur, status post intramedullary rodding of the right femur 04/23/2011, the pathology confirmed metastatic carcinoma. The immunohistochemical profile is not consistent with colon cancer.  - Staging CT evaluation confirming a transverse colon mass, multiple small lung nodules, a lytic lesion at the sternum, and an expansile lesion at the proximal right femur  -Staging PET scan 05/22/2012 with 2 hypermetabolic left liver lesions and multiple bone metastases. Hypermetabolic transverse colon  lesion. No apparent primary tumor site.  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  -Restaging PET scan 10/07/2012 after 4 cycles of gemcitabine/cisplatin confirmed improvement in the liver/bone metastases  2. History of Red cell microcytosis  3. Pain secondary to the right femur lesion -improved.  4. Transverse colon mass-status post a colonoscopy 04/25/2012 confirming a transverse colon mass, biopsy consistent with multiple fragments of a tubular adenoma.  5. Upper endoscopy 04/30/2012 with no evidence of malignancy.  6. Syncope event 05/05/2012-negative noncontrast brain CT 05/05/2012. Presyncope 10/16/2012 felt to be secondary to volume depletion. 7. Pain at the right side of the skull with associated "dizziness "-March 2014. No rash to suggest a zoster infection. Negative brain MRI. His symptoms  resolved  8. Recent discomfort at the right ear-? Serous otitis   Disposition:  His overall performance status continues to improve. He will complete cycle 6 of gemcitabine/cisplatin beginning today. He will receive day 8 gemcitabine if the white count/platelets are adequate. The plan is to follow him with an observation approach after this cycle of chemotherapy. He will return for an office visit on 12/08/2012. He will contact us in the interim for increased pain or new symptoms.    Thornton Papas, MD  10/31/2012  6:51 PM

## 2012-11-07 ENCOUNTER — Ambulatory Visit: Payer: 59

## 2012-11-07 ENCOUNTER — Other Ambulatory Visit (HOSPITAL_BASED_OUTPATIENT_CLINIC_OR_DEPARTMENT_OTHER): Payer: 59 | Admitting: Lab

## 2012-11-07 DIAGNOSIS — C801 Malignant (primary) neoplasm, unspecified: Secondary | ICD-10-CM

## 2012-11-07 LAB — CBC WITH DIFFERENTIAL/PLATELET
Basophils Absolute: 0.1 10*3/uL (ref 0.0–0.1)
HCT: 33.5 % — ABNORMAL LOW (ref 38.4–49.9)
HGB: 10.9 g/dL — ABNORMAL LOW (ref 13.0–17.1)
MONO#: 0.1 10*3/uL (ref 0.1–0.9)
NEUT%: 50.7 % (ref 39.0–75.0)
Platelets: 174 10*3/uL (ref 140–400)
WBC: 2.2 10*3/uL — ABNORMAL LOW (ref 4.0–10.3)
lymph#: 0.8 10*3/uL — ABNORMAL LOW (ref 0.9–3.3)

## 2012-11-07 NOTE — Progress Notes (Signed)
Offered patient the option to receive treatment today and schedule a Neulasta injection for 7/26 or to hold treatment today and follow up with Dr Truett Perna for plan of care next week.  Patient prefers to hold treatment today and states will notify Dr Kalman Drape office next week for further instructions. Patient's spouse with patient and verbalized understanding of plan. Instructed patient and his spouse to call for any other concerns.

## 2012-11-09 ENCOUNTER — Other Ambulatory Visit: Payer: Self-pay | Admitting: Oncology

## 2012-11-10 ENCOUNTER — Encounter: Payer: Self-pay | Admitting: Oncology

## 2012-11-19 ENCOUNTER — Other Ambulatory Visit: Payer: Self-pay

## 2012-11-25 ENCOUNTER — Telehealth: Payer: Self-pay | Admitting: *Deleted

## 2012-11-25 NOTE — Telephone Encounter (Signed)
PT. HAS A ROD IN HIS FEMUR. VERBAL ORDER AND READ BACK TO LISA THOMAS,NP- NEED TO CALL THE ORTHOPEDIC SURGEON WHO PLACED THE ROD  LEFT TAMMI THE ABOVE MESSAGE ON THE OFFICE VOICE MAIL.

## 2012-12-08 ENCOUNTER — Ambulatory Visit (HOSPITAL_BASED_OUTPATIENT_CLINIC_OR_DEPARTMENT_OTHER): Payer: 59 | Admitting: Oncology

## 2012-12-08 ENCOUNTER — Telehealth: Payer: Self-pay | Admitting: Oncology

## 2012-12-08 VITALS — BP 115/69 | HR 105 | Temp 98.1°F | Resp 19 | Ht 75.0 in | Wt 204.1 lb

## 2012-12-08 DIAGNOSIS — C801 Malignant (primary) neoplasm, unspecified: Secondary | ICD-10-CM

## 2012-12-08 NOTE — Progress Notes (Signed)
   [facility]    OFFICE PROGRESS NOTE   INTERVAL HISTORY:   He returns as scheduled. He completed cycle 6 of gemcitabine/cisplatin on 10/31/2012. The day 8 gemcitabine was held secondary to neutropenia. He reports feeling well. No pain. He continues to have altered taste. He is ambulating with a walker and is beginning to try ambulation without the walker. The right ear pain has improved.  Objective:  Vital signs in last 24 hours:  Blood pressure 115/69, pulse 105, temperature 98.1 F (36.7 C), temperature source Oral, resp. rate 19, height 6\' 3"  (1.905 m), weight 204 lb 1.6 oz (92.579 kg), SpO2 99.00%.    HEENT: No thrush or ulcers Lymph nodes: No cervical, supraclavicular, or axillary node Resp: Lungs with and inspiratory bronchial sounds at the upper posterior chest bilaterally, no respiratory Cardio: Regular rate and rhythm GI: No hepatomegaly, nontender, no mass Vascular: Trace low pretibial/ankle edema on the right greater than left   Lab Results:  Lab Results  Component Value Date   WBC 2.2* 11/07/2012   HGB 10.9* 11/07/2012   HCT 33.5* 11/07/2012   MCV 88.6 11/07/2012   PLT 174 11/07/2012   ANC 1.1    Medications: I have reviewed the patient's current medications.  Assessment/Plan: 1.Destructive lesion in the proximal right femur, status post intramedullary rodding of the right femur 04/23/2011, the pathology confirmed metastatic carcinoma. The immunohistochemical profile is not consistent with colon cancer.  - Staging CT evaluation confirming a transverse colon mass, multiple small lung nodules, a lytic lesion at the sternum, and an expansile lesion at the proximal right femur  -Staging PET scan 05/22/2012 with 2 hypermetabolic left liver lesions and multiple bone metastases. Hypermetabolic transverse colon lesion. No apparent primary tumor site.  -Right iliac biopsy 06/10/2012 confirming metastatic carcinoma-unknown primary gene array returned with a  high probability of a pancreaticobiliary primary  -Cycle 1 gemcitabine/cisplatin chemotherapy initiated on 07/11/2012.  -Restaging PET scan 10/07/2012 after 4 cycles of gemcitabine/cisplatin confirmed improvement in the liver/bone metastases  -Cycle 6 of gemcitabine/cisplatin completed on 10/31/2012 2. History of Red cell microcytosis  3. Pain secondary to the right femur lesion -resolved 4. Transverse colon mass-status post a colonoscopy 04/25/2012 confirming a transverse colon mass, biopsy consistent with multiple fragments of a tubular adenoma.  5. Upper endoscopy 04/30/2012 with no evidence of malignancy.  6. Syncope event 05/05/2012-negative noncontrast brain CT 05/05/2012. Presyncope 10/16/2012 felt to be secondary to volume depletion.  7. Pain at the right side of the skull with associated "dizziness "-March 2014. No rash to suggest a zoster infection. Negative brain MRI. His symptoms resolved    Disposition:  He has completed 6 cycles of gemcitabine/cisplatin. His performance status continues to improve. No pain. The plan is to follow him off of chemotherapy. He will return for an office visit in 2 months. Hopefully the altered taste will improve.   Thornton Papas, MD  12/08/2012  10:31 AM

## 2012-12-08 NOTE — Telephone Encounter (Signed)
gave pt appt for October 2014 lab and MD

## 2012-12-17 ENCOUNTER — Encounter: Payer: Self-pay | Admitting: Oncology

## 2012-12-29 ENCOUNTER — Telehealth: Payer: Self-pay | Admitting: *Deleted

## 2012-12-29 NOTE — Telephone Encounter (Signed)
Call from pt reporting back pain. Began this weekend. Taking Naproxen and applying heat which is helping. Asking if he should be seen in office? Reviewed with Dr. Truett Perna: Monitor for now. Call office if pain persists. Pt voiced understanding. Informed him it was OK to continue Naproxen up to twice daily.

## 2013-01-01 ENCOUNTER — Encounter: Payer: Self-pay | Admitting: Oncology

## 2013-01-01 ENCOUNTER — Ambulatory Visit (HOSPITAL_BASED_OUTPATIENT_CLINIC_OR_DEPARTMENT_OTHER): Payer: 59 | Admitting: Oncology

## 2013-01-01 ENCOUNTER — Telehealth: Payer: Self-pay | Admitting: *Deleted

## 2013-01-01 ENCOUNTER — Telehealth: Payer: Self-pay | Admitting: Oncology

## 2013-01-01 VITALS — BP 126/70 | HR 86 | Temp 97.7°F | Resp 20 | Ht 75.0 in | Wt 205.6 lb

## 2013-01-01 DIAGNOSIS — C801 Malignant (primary) neoplasm, unspecified: Secondary | ICD-10-CM

## 2013-01-01 MED ORDER — TRAMADOL HCL 50 MG PO TABS: 50.0000 mg | ORAL_TABLET | Freq: Four times a day (QID) | ORAL | Status: DC | PRN

## 2013-01-01 MED ORDER — OXYCODONE-ACETAMINOPHEN 5-325 MG PO TABS: 1.0000 | ORAL_TABLET | Freq: Four times a day (QID) | ORAL | Status: DC | PRN

## 2013-01-01 MED ORDER — ONDANSETRON HCL 4 MG PO TABS: 4.0000 mg | ORAL_TABLET | Freq: Two times a day (BID) | ORAL | Status: DC | PRN

## 2013-01-01 NOTE — Telephone Encounter (Signed)
called pt, no answer, nurse notified

## 2013-01-01 NOTE — Progress Notes (Signed)
   [facility]    OFFICE PROGRESS NOTE   INTERVAL HISTORY:   Justin Henson returns prior to a scheduled visit. He complains of pain at the right lower back for the past one week. The pain is constant and there are no associated symptoms. No other pain. No dysuria or new numbness/weakness. Naproxen does not relieve the pain.  He reports a good appetite. He is ambulating in the home.  Objective:  Vital signs in last 24 hours:  Blood pressure 126/70, pulse 86, temperature 97.7 F (36.5 C), temperature source Oral, resp. rate 20, height 6\' 3"  (1.905 m), weight 205 lb 9.6 oz (93.26 kg).   Resp: Lungs clear bilaterally Cardio: Regular rate and rhythm GI: No hepatomegaly, the abdomen is nontender Vascular: No leg edema Neuro: Decreased strength with flexion at the right hip, the left hip flexion and bilateral lower leg/foot strength is  Musculoskeletal: The area of pain is at the right lower flank in the paraspinous region. This is at the level of the upper lumbar spine and superior to the iliac crest. No mass. No spine tenderness.     Medications: I have reviewed the patient's current medications.  Assessment/Plan: 1.Destructive lesion in the proximal right femur, status post intramedullary rodding of the right femur 04/23/2011, the pathology confirmed metastatic carcinoma. The immunohistochemical profile is not consistent with colon cancer.  - Staging CT evaluation confirming a transverse colon mass, multiple small lung nodules, a lytic lesion at the sternum, and an expansile lesion at the proximal right femur  -Staging PET scan 05/22/2012 with 2 hypermetabolic left liver lesions and multiple bone metastases. Hypermetabolic transverse colon lesion. No apparent primary tumor site.  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  -Restaging PET scan 10/07/2012 after 4 cycles of gemcitabine/cisplatin confirmed improvement in the liver/bone metastases  -Cycle 6 of gemcitabine/cisplatin completed on 10/31/2012  2. History of Red cell microcytosis  3. Pain secondary to the right femur lesion -resolved  4. Transverse colon mass-status post a colonoscopy 04/25/2012 confirming a transverse colon mass, biopsy consistent with multiple fragments of a tubular adenoma.  5. Upper endoscopy 04/30/2012 with no evidence of malignancy.  6. Syncope event 05/05/2012-negative noncontrast brain CT 05/05/2012. Presyncope 10/16/2012 felt to be secondary to volume depletion.  7. Pain at the right side of the skull with associated "dizziness "-March 2014. No rash to suggest a zoster infection. Negative brain MRI. His symptoms resolved  8. Pain at the right lower paraspinous area-? Etiology   Disposition:  He has developed severe pain at the right lower back. The pain is most likely related to metastatic carcinoma. I reviewed the 10/07/2012 PET scan. He appears to have tumor involving the lumbar spine. We decided to proceed with an MRI of the lumbar spine today in an attempt to define the source of his pain. He will begin oxycodone for pain. We will followup on the MRI study and make a radiation oncology referral as indicated.   Thornton Papas, MD  01/01/2013  6:03 PM

## 2013-01-01 NOTE — Telephone Encounter (Signed)
Message from pt reporting severe pain in R lower back. Aleve no longer effective. Unable to get any rest. Reviewed with Dr. Truett Perna: Order received to work in for appt today. Called pt, he will come to office this AM.

## 2013-01-01 NOTE — Telephone Encounter (Signed)
, °

## 2013-01-02 ENCOUNTER — Other Ambulatory Visit: Payer: Self-pay | Admitting: *Deleted

## 2013-01-02 DIAGNOSIS — C801 Malignant (primary) neoplasm, unspecified: Secondary | ICD-10-CM

## 2013-01-02 DIAGNOSIS — F411 Generalized anxiety disorder: Secondary | ICD-10-CM

## 2013-01-02 MED ORDER — ALPRAZOLAM 0.5 MG PO TABS: 0.5000 mg | ORAL_TABLET | Freq: Three times a day (TID) | ORAL | Status: DC | PRN

## 2013-01-05 ENCOUNTER — Telehealth: Payer: Self-pay | Admitting: Medical Oncology

## 2013-01-05 NOTE — Telephone Encounter (Signed)
Pt called and left a message that he has not been called for an appointment to get his MRI. I spoke with Rose and she is going to contact central scheduling about getting this set up for the pt. I informed him that he should get a call and if not to please call us back.

## 2013-01-07 ENCOUNTER — Telehealth: Payer: Self-pay | Admitting: *Deleted

## 2013-01-07 ENCOUNTER — Encounter: Payer: Self-pay | Admitting: Oncology

## 2013-01-07 NOTE — Telephone Encounter (Signed)
Returned call to pt's wife, she sent MyChart message re: arm pain. Per Dr. Truett Perna: Do not need to MRI of arm. Will call with results after MRI 9/25. Justin Henson voiced understanding.

## 2013-01-08 ENCOUNTER — Ambulatory Visit (HOSPITAL_COMMUNITY)
Admission: RE | Admit: 2013-01-08 | Discharge: 2013-01-08 | Disposition: A | Discharge status: Home / Self Care | Payer: 59 | Source: Ambulatory Visit | Attending: Oncology | Admitting: Oncology

## 2013-01-08 ENCOUNTER — Telehealth: Payer: Self-pay | Admitting: *Deleted

## 2013-01-08 DIAGNOSIS — M51379 Other intervertebral disc degeneration, lumbosacral region without mention of lumbar back pain or lower extremity pain: Secondary | ICD-10-CM | POA: Insufficient documentation

## 2013-01-08 DIAGNOSIS — K821 Hydrops of gallbladder: Secondary | ICD-10-CM | POA: Insufficient documentation

## 2013-01-08 DIAGNOSIS — C801 Malignant (primary) neoplasm, unspecified: Secondary | ICD-10-CM | POA: Insufficient documentation

## 2013-01-08 DIAGNOSIS — M5137 Other intervertebral disc degeneration, lumbosacral region: Secondary | ICD-10-CM | POA: Insufficient documentation

## 2013-01-08 DIAGNOSIS — C7951 Secondary malignant neoplasm of bone: Secondary | ICD-10-CM | POA: Insufficient documentation

## 2013-01-08 DIAGNOSIS — M8448XA Pathological fracture, other site, initial encounter for fracture: Secondary | ICD-10-CM | POA: Insufficient documentation

## 2013-01-08 NOTE — Telephone Encounter (Signed)
Calling to request results of today's MRI. Was told MD would have results by 4pm.

## 2013-01-09 ENCOUNTER — Ambulatory Visit
Admission: RE | Admit: 2013-01-09 | Discharge: 2013-01-09 | Disposition: A | Discharge status: Home / Self Care | Payer: 59 | Source: Ambulatory Visit | Attending: Radiation Oncology | Admitting: Radiation Oncology

## 2013-01-09 ENCOUNTER — Other Ambulatory Visit: Payer: Self-pay | Admitting: *Deleted

## 2013-01-09 ENCOUNTER — Encounter: Payer: Self-pay | Admitting: *Deleted

## 2013-01-09 ENCOUNTER — Ambulatory Visit: Payer: 59

## 2013-01-09 ENCOUNTER — Encounter: Payer: Self-pay | Admitting: Oncology

## 2013-01-09 ENCOUNTER — Telehealth: Payer: Self-pay | Admitting: Oncology

## 2013-01-09 ENCOUNTER — Ambulatory Visit: Admission: RE | Admit: 2013-01-09 | Payer: 59 | Source: Ambulatory Visit | Admitting: Radiation Oncology

## 2013-01-09 ENCOUNTER — Encounter: Payer: Self-pay | Admitting: Radiation Oncology

## 2013-01-09 DIAGNOSIS — M549 Dorsalgia, unspecified: Secondary | ICD-10-CM | POA: Insufficient documentation

## 2013-01-09 DIAGNOSIS — C7951 Secondary malignant neoplasm of bone: Secondary | ICD-10-CM | POA: Insufficient documentation

## 2013-01-09 DIAGNOSIS — C801 Malignant (primary) neoplasm, unspecified: Secondary | ICD-10-CM

## 2013-01-09 DIAGNOSIS — R110 Nausea: Secondary | ICD-10-CM | POA: Insufficient documentation

## 2013-01-09 DIAGNOSIS — Z510 Encounter for antineoplastic radiation therapy: Secondary | ICD-10-CM | POA: Insufficient documentation

## 2013-01-09 NOTE — Progress Notes (Signed)
Called patient to let him know about a nursing appointment 01/12/13 at 2:30pm so the RO nursing staff can get up to date information in his records prior to his treatment.  Patient confirmed he would be here.

## 2013-01-09 NOTE — Telephone Encounter (Signed)
talked to pt and gave him appt for 01/20/13 r/s per MD

## 2013-01-09 NOTE — Progress Notes (Signed)
Notified Darryl Nestle in radiation oncology via voice mail of referral for Dr. Mitzi Hansen to see patient on 01/12/13 (entered 9/26 in error).

## 2013-01-11 ENCOUNTER — Encounter: Payer: Self-pay | Admitting: Oncology

## 2013-01-12 ENCOUNTER — Ambulatory Visit
Admission: RE | Admit: 2013-01-12 | Discharge: 2013-01-12 | Disposition: A | Discharge status: Home / Self Care | Payer: 59 | Source: Ambulatory Visit | Attending: Radiation Oncology | Admitting: Radiation Oncology

## 2013-01-12 VITALS — BP 125/74 | HR 97 | Temp 98.3°F | Resp 20 | Wt 203.5 lb

## 2013-01-12 DIAGNOSIS — C7951 Secondary malignant neoplasm of bone: Secondary | ICD-10-CM

## 2013-01-12 NOTE — Progress Notes (Signed)
patieent brought in w/c, had fall yesterday at home hurt his right hip side, said he tucked and rooled when attempting to get up recliner chair, fell to his right, no scrapes, bruises, carpeted, out of robaxin needs refill, feet pitting edema, up dated meds 3:02 PM

## 2013-01-12 NOTE — Progress Notes (Signed)
  Radiation Oncology         [phone] ________________________________  Name: [name] MRN: [id number]  Date: 01/12/2013  DOB: 1945-07-12  Simulation Verification Note   NARRATIVE: The patient was brought to the treatment unit and placed in the planned treatment position. The clinical setup was verified. Then port films were obtained and uploaded to the radiation oncology medical record software.  The treatment beams were carefully compared against the planned radiation fields. The position, location, and shape of the radiation fields was reviewed. The targeted volume of tissue appears to be appropriately covered by the radiation beams. Based on my personal review, I approved the simulation verification. The patient's treatment will proceed as planned.  ________________________________   Radene Gunning, MD, PhD

## 2013-01-13 ENCOUNTER — Encounter: Payer: Self-pay | Admitting: Radiation Oncology

## 2013-01-13 ENCOUNTER — Encounter: Payer: Self-pay | Admitting: *Deleted

## 2013-01-13 ENCOUNTER — Ambulatory Visit: Admission: RE | Admit: 2013-01-13 | Payer: 59 | Source: Ambulatory Visit

## 2013-01-13 ENCOUNTER — Ambulatory Visit
Admission: RE | Admit: 2013-01-13 | Discharge: 2013-01-13 | Disposition: A | Discharge status: Home / Self Care | Payer: 59 | Source: Ambulatory Visit | Attending: Radiation Oncology | Admitting: Radiation Oncology

## 2013-01-13 ENCOUNTER — Telehealth: Payer: Self-pay | Admitting: *Deleted

## 2013-01-13 DIAGNOSIS — C801 Malignant (primary) neoplasm, unspecified: Secondary | ICD-10-CM

## 2013-01-13 DIAGNOSIS — C7951 Secondary malignant neoplasm of bone: Secondary | ICD-10-CM

## 2013-01-13 MED ORDER — OXYCODONE-ACETAMINOPHEN 5-325 MG PO TABS: 1.0000 | ORAL_TABLET | Freq: Four times a day (QID) | ORAL | Status: DC | PRN

## 2013-01-13 NOTE — Progress Notes (Signed)
Patient not tx as yet, but pain a 6 on 1-10 scale  t-spine, l rib,  After rad tx yesterday  Laying on hard table for port,  Ithe was in so much pain he couldn't get out of the car at home for 1.5 hours, he had fallen on Sunday night, from recliner on carpeted floor he had attempted to stand but slid down on right side, no brusining or abrasions seen, he daid his side hurts worse today than yesterday, on oxycodone, wife requesting rx for a w/c again,  He defers to be tx today,just wants to go home

## 2013-01-13 NOTE — Telephone Encounter (Signed)
Called patient who is asleep and spoke with spouse.  Reports the script for wheelchair was received yesterday from RT.  Still needs refill on pain medicine.  Will pick up tomorrow morning.  Will notify providers.

## 2013-01-13 NOTE — Progress Notes (Signed)
Dr. Roselind Messier wrote Rx for patient to get a w/c, encouraged pateint to alternate heat pad and ice every 20-30 minutes and take pain medication , and eat 10:02 AM

## 2013-01-13 NOTE — Progress Notes (Signed)
Reviewed with wife and patient patient education, she declined another book on radiation,"i still have the last one, "disccussed skin irritation,fatigie, n,v,d, diet,pain, all questions answered 10:30 AM

## 2013-01-14 ENCOUNTER — Ambulatory Visit: Payer: 59

## 2013-01-14 ENCOUNTER — Telehealth: Payer: Self-pay | Admitting: *Deleted

## 2013-01-14 NOTE — Telephone Encounter (Signed)
After talking with Mardella Layman, therapist , patient cancelled his treatment today, called and spoke with Mr. Rivet, he stated his pain is a 4 now, did do heat pack and ice packs alternately yesterday evening, along with his pain medication and that has helped his pain it is down to a 4 on 1-10 scale",  He stated also no transportation today"everyone went to work" asked if his wife also went to work "Yes she did", I feel like I have been run over with a truck", asked if he needed to get an xray for his side, No, just sore, no bruising or abrasions, declined to also come early tomorrow to see MD 1st"he will try and be here tomorrow has transportation 9:51 AM

## 2013-01-15 ENCOUNTER — Telehealth: Payer: Self-pay | Admitting: *Deleted

## 2013-01-15 ENCOUNTER — Ambulatory Visit
Admission: RE | Admit: 2013-01-15 | Discharge: 2013-01-15 | Disposition: A | Discharge status: Home / Self Care | Payer: 59 | Source: Ambulatory Visit | Attending: Radiation Oncology | Admitting: Radiation Oncology

## 2013-01-15 NOTE — Telephone Encounter (Signed)
Returned call to wife after hearing voice message on my phone  from Hinton Rao, Medical Oncology , patient is getting nauseated after rad tx when he gets home, new for him, asked if he had taken his zofran, "no, should he take this before he comes for treatment then"? Said yes, to take his zofran before coming for treatment tomorrow and Dr.Moody to see him after his rad tx Friday, and if hasn't taken his zofran yet to go ahead, drink clear fluids, stay away from milk products,spicy foods/grerasy foods while nauseated,wife thanked me for the return call, will see them tomorrow, no fever 1:02 PM

## 2013-01-15 NOTE — Telephone Encounter (Signed)
Wife reports N/V every day after his radiation appointment and wants to discuss this with nurse. Forwarded this message to Dr. Joellen Jersey nurse voice mail and called wife back to make her aware she should be hearing from radiation oncology nurse this afternoon.

## 2013-01-16 ENCOUNTER — Ambulatory Visit
Admission: RE | Admit: 2013-01-16 | Discharge: 2013-01-16 | Disposition: A | Discharge status: Home / Self Care | Payer: 59 | Source: Ambulatory Visit | Attending: Radiation Oncology | Admitting: Radiation Oncology

## 2013-01-16 ENCOUNTER — Encounter: Payer: Self-pay | Admitting: Radiation Oncology

## 2013-01-16 VITALS — BP 125/69 | HR 88 | Temp 97.8°F | Resp 20

## 2013-01-16 DIAGNOSIS — C7951 Secondary malignant neoplasm of bone: Secondary | ICD-10-CM

## 2013-01-16 NOTE — Progress Notes (Signed)
   Department of Radiation Oncology  Phone:  [phone] Fax:        [phone]  Weekly Treatment Note    Name: SAJAN CHEATWOOD Date: 01/16/2013 MRN: [id number] DOB: 01/08/1946   Current dose: 9 Gy  Current fraction: 3   MEDICATIONS: Current Outpatient Prescriptions  Medication Sig Dispense Refill  . acetaminophen (TYLENOL) 500 MG tablet Take 1,000 mg by mouth every 6 (six) hours as needed for pain.      Marland Kitchen ALPRAZolam (XANAX) 0.5 MG tablet Take 1 tablet (0.5 mg total) by mouth 3 (three) times daily as needed for sleep or anxiety.  60 tablet  1  . methocarbamol (ROBAXIN) 500 MG tablet TAKE 1 TABLET BY MOUTH EVERY 6 HOURS AS NEEDED  60 tablet  0  . Multiple Vitamin (MULTIVITAMIN WITH MINERALS) TABS Take 1 tablet by mouth daily.      . naproxen sodium (ANAPROX) 220 MG tablet Take 220 mg by mouth 2 (two) times daily with a meal. PRN      . ondansetron (ZOFRAN) 4 MG tablet Take 1 tablet (4 mg total) by mouth every 12 (twelve) hours as needed for nausea.  20 tablet  1  . oxyCODONE-acetaminophen (ROXICET) 5-325 MG per tablet Take 1-2 tablets by mouth every 6 (six) hours as needed for pain.  30 tablet  0  . Polyethylene Glycol 3350 (MIRALAX PO) Take 17 g by mouth daily. Takes about every 3 days       No current facility-administered medications for this encounter.     ALLERGIES: Promethazine   LABORATORY DATA:  Lab Results  Component Value Date   WBC 2.2* 11/07/2012   HGB 10.9* 11/07/2012   HCT 33.5* 11/07/2012   MCV 88.6 11/07/2012   PLT 174 11/07/2012   Lab Results  Component Value Date   NA 140 10/31/2012   K 4.3 10/31/2012   CL 104 10/16/2012   CO2 26 10/31/2012   Lab Results  Component Value Date   ALT 8 10/31/2012   AST 14 10/31/2012   ALKPHOS 118 10/31/2012   BILITOT 0.25 10/31/2012     NARRATIVE: Justin Henson was seen today for weekly treatment management. The chart was checked and the patient's films were reviewed. The patient has had some nausea. We have  recommended that he begin using his nausea medicine prior to treatment each day. The patient complains of fatigue. Also radiating pain at the level of treatment within the T-spine. He describes this currently as severe.  PHYSICAL EXAMINATION: oral temperature is 97.8 F (36.6 C). His blood pressure is 125/69 and his pulse is 88. His respiration is 20.        ASSESSMENT: The patient is doing satisfactorily with treatment.  PLAN: We will continue with the patient's radiation treatment as planned. The patient will take nausea medicine as described above. I indicated to the patient that he could take his pain medicine every 4 hours rather than every 6 hours. We will see how this works in addition to his radiation treatment. He will need a refill at this rate next week and we can see if any further adjustments are necessary.

## 2013-01-16 NOTE — Progress Notes (Signed)
Weekly rad tx, 3//10 txs done, still very fatigued, vomited yesterday, drinks plenty water, this am oj, and coffee even though nauseated, sugessted to stay off those fluids, he did take his zofran this am before coming and pain med, encouraged to eat bland foods, pain mild pain, very fatigued, flat affect depressed 9:49 AM

## 2013-01-16 NOTE — Progress Notes (Signed)
  Radiation Oncology         [phone]) [phone] ________________________________  Name: Justin Henson MRN: [id number]  Date: 01/09/2013  DOB: 07/21/1945  SIMULATION AND TREATMENT PLANNING NOTE  DIAGNOSIS:  Metastatic carcinoma with bony metastasis  NARRATIVE:  The patient was brought to the CT Simulation planning suite.  the patient will be treated to 2 separate target areas: A left rib metastasis in addition to the lower T-spine T9-L1. Identity was confirmed.  All relevant records and images related to the planned course of therapy were reviewed.   Written consent to proceed with treatment was confirmed which was freely given after reviewing the details related to the planned course of therapy had been reviewed with the patient.  Then, the patient was set-up in a stable reproducible  supine position for radiation therapy.  A customized VAC lock bag was constructed to help with patient immobilization. This complex treatment device will be used daily.  CT images were obtained.  Surface markings were placed.    The CT images were loaded into the planning software.  Then the target and avoidance structures were contoured.  Treatment planning then occurred.  The radiation prescription was entered and confirmed.  A total of 4  complex treatment devices were fabricated which relate to the designed radiation treatment fields: 2 customized fields for the left rib metastasis and 2 customized fields for the lower T-spine . Each of these customized fields/ complex treatment devices will be used on a daily basis during the radiation course. I have requested : 3D Simulation  I have requested a DVH of the following structures: Gross tumor volume, spinal cord, kidneys bilaterally.   PLAN:  The patient will receive 30 Gy in 10 fractions to both target areas .  ________________________________   Radene Gunning, MD, PhD

## 2013-01-19 ENCOUNTER — Other Ambulatory Visit: Payer: 59 | Admitting: Lab

## 2013-01-19 ENCOUNTER — Ambulatory Visit
Admission: RE | Admit: 2013-01-19 | Discharge: 2013-01-19 | Disposition: A | Discharge status: Home / Self Care | Payer: 59 | Source: Ambulatory Visit | Attending: Radiation Oncology | Admitting: Radiation Oncology

## 2013-01-19 NOTE — Progress Notes (Signed)
  Radiation Oncology         [phone]) [phone] ________________________________  Name: Justin Henson MRN: [id number]  Date: 01/12/2013  DOB: 1945-12-26  Simulation Verification Note   NARRATIVE: The patient was brought to the treatment unit and placed in the planned treatment position. The clinical setup was verified. Then port films were obtained and uploaded to the radiation oncology medical record software.  The treatment beams were carefully compared against the planned radiation fields. The position, location, and shape of the radiation fields was reviewed. The targeted volume of tissue appears to be appropriately covered by the radiation beams. Based on my personal review, I approved the simulation verification. The patient's treatment will proceed as planned.  ________________________________   Radene Gunning, MD, PhD

## 2013-01-20 ENCOUNTER — Telehealth: Payer: Self-pay | Admitting: Oncology

## 2013-01-20 ENCOUNTER — Ambulatory Visit
Admission: RE | Admit: 2013-01-20 | Discharge: 2013-01-20 | Disposition: A | Discharge status: Home / Self Care | Payer: 59 | Source: Ambulatory Visit | Attending: Radiation Oncology | Admitting: Radiation Oncology

## 2013-01-20 ENCOUNTER — Other Ambulatory Visit (HOSPITAL_BASED_OUTPATIENT_CLINIC_OR_DEPARTMENT_OTHER): Payer: 59 | Admitting: Lab

## 2013-01-20 ENCOUNTER — Other Ambulatory Visit: Payer: Self-pay | Admitting: *Deleted

## 2013-01-20 ENCOUNTER — Ambulatory Visit (HOSPITAL_BASED_OUTPATIENT_CLINIC_OR_DEPARTMENT_OTHER): Payer: 59 | Admitting: Oncology

## 2013-01-20 VITALS — BP 118/66 | HR 79 | Temp 97.8°F | Resp 18

## 2013-01-20 DIAGNOSIS — R110 Nausea: Secondary | ICD-10-CM

## 2013-01-20 DIAGNOSIS — C7951 Secondary malignant neoplasm of bone: Secondary | ICD-10-CM

## 2013-01-20 DIAGNOSIS — C801 Malignant (primary) neoplasm, unspecified: Secondary | ICD-10-CM

## 2013-01-20 DIAGNOSIS — K5900 Constipation, unspecified: Secondary | ICD-10-CM

## 2013-01-20 LAB — CBC WITH DIFFERENTIAL/PLATELET
Basophils Absolute: 0 10*3/uL (ref 0.0–0.1)
Eosinophils Absolute: 0.1 10*3/uL (ref 0.0–0.5)
HCT: 38.1 % — ABNORMAL LOW (ref 38.4–49.9)
HGB: 12.3 g/dL — ABNORMAL LOW (ref 13.0–17.1)
LYMPH%: 5 % — ABNORMAL LOW (ref 14.0–49.0)
MCV: 81.2 fL (ref 79.3–98.0)
MONO#: 0.6 10*3/uL (ref 0.1–0.9)
NEUT#: 7.5 10*3/uL — ABNORMAL HIGH (ref 1.5–6.5)
Platelets: 267 10*3/uL (ref 140–400)
RBC: 4.69 10*6/uL (ref 4.20–5.82)
RDW: 15.1 % — ABNORMAL HIGH (ref 11.0–14.6)
WBC: 8.6 10*3/uL (ref 4.0–10.3)
nRBC: 0 % (ref 0–0)

## 2013-01-20 MED ORDER — MORPHINE SULFATE ER 30 MG PO TBCR: 30.0000 mg | EXTENDED_RELEASE_TABLET | Freq: Two times a day (BID) | ORAL | Status: DC

## 2013-01-20 MED ORDER — LORAZEPAM 1 MG PO TABS
ORAL_TABLET | ORAL | Status: AC
  Filled 2013-01-20: qty 1

## 2013-01-20 MED ORDER — HYDROMORPHONE HCL 4 MG PO TABS: 4.0000 mg | ORAL_TABLET | ORAL | Status: DC | PRN

## 2013-01-20 MED ORDER — PROMETHAZINE HCL 12.5 MG PO TABS: 12.5000 mg | ORAL_TABLET | Freq: Four times a day (QID) | ORAL | Status: AC | PRN

## 2013-01-20 MED ORDER — LORAZEPAM 1 MG PO TABS
0.5000 mg | ORAL_TABLET | Freq: Once | ORAL | Status: AC
  Administered 2013-01-20: 0.5 mg via ORAL

## 2013-01-20 NOTE — Telephone Encounter (Signed)
GAve pt appt for ML 10/14

## 2013-01-20 NOTE — Progress Notes (Signed)
[facility]    OFFICE PROGRESS NOTE   INTERVAL HISTORY:   He returns for scheduled visit. The MRI on 01/08/2013 confirmed diffuse metastatic disease involving the thoracolumbar spine. A T12 compression fracture was noted. Diffuse involvement of T11 with extraosseous extension on the right was noted. There is also a T11 compression fracture.  He began palliative radiation to the T9-L1 levels and a left rib metastasis on 01/12/2013. He continues to have pain at the right lower back. He describes a "band "of pain extending around the lower abdominal wall. The pain is not relieved with Percocet. He is currently taking 2 Percocet tablets every 4 hours. He has a bowel movement every 2-3 days and is now taking MiraLAX daily. He is eating very little. He has developed nausea, not relieved with Zofran. No pain at other sites. He is able to ambulate with a walker. No new leg weakness or neurologic symptoms.  Objective:  Vital signs in last 24 hours:  Blood pressure 118/66, pulse 79, temperature 97.8 F (36.6 C), temperature source Oral, resp. rate 18.    HEENT: No thrush Resp: Lungs clear bilaterally Cardio: Regular rate and rhythm GI: No hepatosplenomegaly, nontender Vascular: No leg edema Neuro: Alert and oriented, mild weakness with flexion at the right hip . The leg strength is otherwise intact.      Lab Results:  Lab Results  Component Value Date   WBC 8.6 01/20/2013   HGB 12.3* 01/20/2013   HCT 38.1* 01/20/2013   MCV 81.2 01/20/2013   PLT 267 01/20/2013      Medications: I have reviewed the patient's current medications.  Assessment/Plan: 1.Destructive lesion in the proximal right femur, status post intramedullary rodding of the right femur 04/23/2011, the pathology confirmed metastatic carcinoma. The immunohistochemical profile is not consistent with colon cancer.  - Staging CT evaluation confirming a transverse colon mass, multiple small lung nodules, a lytic  lesion at the sternum, and an expansile lesion at the proximal right femur  -Staging PET scan 05/22/2012 with 2 hypermetabolic left liver lesions and multiple bone metastases. Hypermetabolic transverse colon lesion. No apparent primary tumor site.  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  -Restaging PET scan 10/07/2012 after 4 cycles of gemcitabine/cisplatin confirmed improvement in the liver/bone metastases  -Cycle 6 of gemcitabine/cisplatin completed on 10/31/2012  2. History of Red cell microcytosis  3. Pain secondary to the right femur lesion -resolved  4. Transverse colon mass-status post a colonoscopy 04/25/2012 confirming a transverse colon mass, biopsy consistent with multiple fragments of a tubular adenoma.  5. Upper endoscopy 04/30/2012 with no evidence of malignancy.  6. Syncope event 05/05/2012-negative noncontrast brain CT 05/05/2012. Presyncope 10/16/2012 felt to be secondary to volume depletion.  7. Pain at the right side of the skull with associated "dizziness "-March 2014. No rash to suggest a zoster infection. Negative brain MRI. His symptoms resolved  8. Pain at the right lower paraspinous area-MRI on 01/08/2013 confirmed extensive tumor involving the thoracolumbar spine with compression fractures at T11, T12-to begin palliative radiation to the T9-L1 levels on 01/12/2013. 9. Nausea-potentially related to radiation, metastatic disease involving the liver/abdomen, or polypharmacy     Disposition:  Mr. Burdin continues to have severe pain at the low back with a radicular component over the abdomen. He knows to contact us for new neurologic symptoms. He will continue MiraLAX daily for the  constipation. We will add Phenergan for nausea.  Percocet is not controlling the pain. We added MS Contin  and Dilaudid today. I instructed Mr. Bozard and his brother on how to use the MS Contin/Dilaudid.  He will return for an office visit on 01/27/2013.  He has progressive metastatic carcinoma of unknown primary. We will consider salvage chemotherapy options if his pain is under better control and he has an adequate performance status.  Approximately 25 minutes were spent with patient today. The majority of the time was used for counseling and coordination of care.   Thornton Papas, MD  01/20/2013  9:39 AM

## 2013-01-21 ENCOUNTER — Ambulatory Visit
Admission: RE | Admit: 2013-01-21 | Discharge: 2013-01-21 | Disposition: A | Discharge status: Home / Self Care | Payer: 59 | Source: Ambulatory Visit | Attending: Radiation Oncology | Admitting: Radiation Oncology

## 2013-01-21 DIAGNOSIS — C7951 Secondary malignant neoplasm of bone: Secondary | ICD-10-CM

## 2013-01-21 MED ORDER — ONDANSETRON HCL 4 MG PO TABS
8.0000 mg | ORAL_TABLET | Freq: Once | ORAL | Status: DC
  Filled 2013-01-21: qty 2

## 2013-01-21 MED ORDER — ONDANSETRON HCL 4 MG PO TABS
8.0000 mg | ORAL_TABLET | Freq: Once | ORAL | Status: AC
  Administered 2013-01-21: 8 mg via ORAL
  Filled 2013-01-21: qty 2

## 2013-01-21 NOTE — Progress Notes (Signed)
Patient brought to nursing after rad tx, he had thrown up 4-5 emesis basins after rad tx, MD in back, orders for 8mg  oral zofran given  To patient,offerered gingeralem,"no just water,I ate at 5am this morning", will not eat before coming tomorrow for rad txs, stated patient, patient okay to go home and rest, patient tolerated swallowing 2 tabs of 4mg  zoran without difficulty,patient declined offer moist cloth and emesis basin when leaving via w/c with care giver 9:30 AM

## 2013-01-21 NOTE — Progress Notes (Signed)
   Department of Radiation Oncology  Phone:  [phone] Fax:        [phone]  Weekly Treatment Note    Name: Justin Henson Date: 01/21/2013 MRN: [id number] DOB: 03-08-1946   Current dose: 18 Gy  Current fraction: 6   MEDICATIONS: Current Outpatient Prescriptions  Medication Sig Dispense Refill  . acetaminophen (TYLENOL) 500 MG tablet Take 1,000 mg by mouth every 6 (six) hours as needed for pain.      Marland Kitchen ALPRAZolam (XANAX) 0.5 MG tablet Take 1 tablet (0.5 mg total) by mouth 3 (three) times daily as needed for sleep or anxiety.  60 tablet  1  . HYDROmorphone (DILAUDID) 4 MG tablet Take 1-2 tablets (4-8 mg total) by mouth every 4 (four) hours as needed for pain.  75 tablet  0  . methocarbamol (ROBAXIN) 500 MG tablet TAKE 1 TABLET BY MOUTH EVERY 6 HOURS AS NEEDED  60 tablet  0  . morphine (MS CONTIN) 30 MG 12 hr tablet Take 1 tablet (30 mg total) by mouth 2 (two) times daily.  60 tablet  0  . Multiple Vitamin (MULTIVITAMIN WITH MINERALS) TABS Take 1 tablet by mouth daily.      . naproxen sodium (ANAPROX) 220 MG tablet Take 220 mg by mouth 2 (two) times daily with a meal. PRN      . ondansetron (ZOFRAN) 4 MG tablet Take 1 tablet (4 mg total) by mouth every 12 (twelve) hours as needed for nausea.  20 tablet  1  . oxyCODONE-acetaminophen (ROXICET) 5-325 MG per tablet Take 1-2 tablets by mouth every 6 (six) hours as needed for pain.  30 tablet  0  . Polyethylene Glycol 3350 (MIRALAX PO) Take 17 g by mouth daily.       . promethazine (PHENERGAN) 12.5 MG tablet Take 1 tablet (12.5 mg total) by mouth every 6 (six) hours as needed for nausea.  30 tablet  1   Current Facility-Administered Medications  Medication Dose Route Frequency Provider Last Rate Last Dose  . ondansetron (ZOFRAN) tablet 8 mg  8 mg Oral Once Jonna Coup, MD         ALLERGIES: Promethazine   LABORATORY DATA:  Lab Results  Component Value Date   WBC 8.6 01/20/2013   HGB 12.3* 01/20/2013   HCT 38.1*  01/20/2013   MCV 81.2 01/20/2013   PLT 267 01/20/2013   Lab Results  Component Value Date   NA 140 10/31/2012   K 4.3 10/31/2012   CL 104 10/16/2012   CO2 26 10/31/2012   Lab Results  Component Value Date   ALT 8 10/31/2012   AST 14 10/31/2012   ALKPHOS 118 10/31/2012   BILITOT 0.25 10/31/2012     NARRATIVE: Justin Henson was seen today for weekly treatment management. The chart was checked and the patient's films were reviewed. The patient had some nausea in clinic today. He does take Zofran at home which he states works well. He has not taken any of this this morning. Zofran was given therefore in clinic.  PHYSICAL EXAMINATION:   Alert, actively throwing up in the treatment room after his treatment was completed. We were able to finish his treatment today.  ASSESSMENT: The patient is doing satisfactorily with treatment. some nausea and he will continue to take Zofran.   PLAN: We will continue with the patient's radiation treatment as planned.

## 2013-01-22 ENCOUNTER — Ambulatory Visit
Admission: RE | Admit: 2013-01-22 | Discharge: 2013-01-22 | Disposition: A | Discharge status: Home / Self Care | Payer: 59 | Source: Ambulatory Visit | Attending: Radiation Oncology | Admitting: Radiation Oncology

## 2013-01-23 ENCOUNTER — Ambulatory Visit
Admission: RE | Admit: 2013-01-23 | Discharge: 2013-01-23 | Disposition: A | Discharge status: Home / Self Care | Payer: 59 | Source: Ambulatory Visit | Attending: Radiation Oncology | Admitting: Radiation Oncology

## 2013-01-26 ENCOUNTER — Ambulatory Visit
Admission: RE | Admit: 2013-01-26 | Discharge: 2013-01-26 | Disposition: A | Discharge status: Home / Self Care | Payer: 59 | Source: Ambulatory Visit | Attending: Radiation Oncology | Admitting: Radiation Oncology

## 2013-01-27 ENCOUNTER — Ambulatory Visit
Admission: RE | Admit: 2013-01-27 | Discharge: 2013-01-27 | Disposition: A | Discharge status: Home / Self Care | Payer: 59 | Source: Ambulatory Visit | Attending: Radiation Oncology | Admitting: Radiation Oncology

## 2013-01-27 ENCOUNTER — Telehealth: Payer: Self-pay | Admitting: Oncology

## 2013-01-27 ENCOUNTER — Encounter: Payer: Self-pay | Admitting: Radiation Oncology

## 2013-01-27 ENCOUNTER — Ambulatory Visit (HOSPITAL_BASED_OUTPATIENT_CLINIC_OR_DEPARTMENT_OTHER): Payer: 59 | Admitting: Nurse Practitioner

## 2013-01-27 VITALS — BP 112/74 | HR 100 | Temp 97.4°F | Resp 16

## 2013-01-27 VITALS — BP 126/76 | HR 105 | Temp 98.1°F | Resp 20 | Ht 75.0 in

## 2013-01-27 DIAGNOSIS — K7689 Other specified diseases of liver: Secondary | ICD-10-CM

## 2013-01-27 DIAGNOSIS — C7951 Secondary malignant neoplasm of bone: Secondary | ICD-10-CM

## 2013-01-27 DIAGNOSIS — C801 Malignant (primary) neoplasm, unspecified: Secondary | ICD-10-CM

## 2013-01-27 MED ORDER — LORAZEPAM 0.5 MG PO TABS
0.5000 mg | ORAL_TABLET | Freq: Once | ORAL | Status: AC
  Administered 2013-01-27: 0.5 mg via ORAL
  Filled 2013-01-27: qty 1

## 2013-01-27 MED ORDER — LORAZEPAM 0.5 MG PO TABS
0.5000 mg | ORAL_TABLET | Freq: Once | ORAL | Status: DC
  Filled 2013-01-27: qty 1

## 2013-01-27 MED ORDER — LORAZEPAM 0.5 MG PO TABS: 0.5000 mg | ORAL_TABLET | Freq: Three times a day (TID) | ORAL | Status: DC

## 2013-01-27 MED ORDER — HYDROMORPHONE HCL 4 MG PO TABS: 4.0000 mg | ORAL_TABLET | ORAL | Status: DC | PRN

## 2013-01-27 NOTE — Progress Notes (Signed)
OFFICE PROGRESS NOTE  Interval history:  [name] is a 67 year old man with metastatic carcinoma of unknown primary. He completed the course of palliative radiation to T9-L1 and a left rib metastasis today. He is seen for scheduled followup.  He reports overall his pain has been doing better. However, he almost "fell out of bed" this morning and has since noted increased pain at the left back and left leg. He continues to have intermittent nausea/vomiting. Yesterday he tolerated fluids without difficulty. Bowels moving. He denies fever. No shortness of breath. No hematuria or dysuria. He denies any bleeding. At home he is up with a walker.   Objective: Blood pressure 126/76, pulse 105, temperature 98.1 F (36.7 C), temperature source Oral, resp. rate 20, height 6\' 3"  (1.905 m), weight 0 lb (0 kg).  He requested the examination be done in the wheelchair. Oropharynx is without thrush or ulceration. Lungs are clear. Regular cardiac rhythm. Abdomen is soft and nontender. No hepatomegaly. Trace ankle edema bilaterally. Calves soft and nontender. Leg strength appears intact. He is alert and oriented. No skin rash.  Lab Results: Lab Results  Component Value Date   WBC 8.6 01/20/2013   HGB 12.3* 01/20/2013   HCT 38.1* 01/20/2013   MCV 81.2 01/20/2013   PLT 267 01/20/2013    Chemistry:    Chemistry      Component Value Date/Time   NA 140 10/31/2012 0826   NA 138 10/16/2012 1025   K 4.3 10/31/2012 0826   K 5.3* 10/16/2012 1025   CL 104 10/16/2012 1025   CL 103 09/19/2012 0848   CO2 26 10/31/2012 0826   CO2 27 10/16/2012 1025   BUN 11.4 10/31/2012 0826   BUN 14 10/16/2012 1025   CREATININE 1.0 10/31/2012 0826   CREATININE 0.89 10/16/2012 1025      Component Value Date/Time   CALCIUM 9.0 10/31/2012 0826   CALCIUM 9.0 10/16/2012 1025   ALKPHOS 118 10/31/2012 0826   ALKPHOS 119* 05/05/2012 0826   AST 14 10/31/2012 0826   AST 19 05/05/2012 0826   ALT 8 10/31/2012 0826   ALT 12 05/05/2012 0826   BILITOT 0.25  10/31/2012 0826   BILITOT 0.6 05/05/2012 0826       Studies/Results: Mr Lumbar Spine Wo Contrast  01/08/2013   CLINICAL DATA:  Metastatic cancer. Low back pain in the paraspinous region. Do unknown primary.  EXAM: MRI LUMBAR SPINE WITHOUT CONTRAST  TECHNIQUE: Multiplanar, multisequence MR imaging was performed. No intravenous contrast was administered.  COMPARISON:  PET-CT 10/07/2012. MRI lumbar spine 02/14/2012.  FINDINGS: The numbering convention used for this exam termed L5-S1 as the last intervertebral disc space. As demonstrated on prior PET-CT, bony spinal metastatic disease is now present, which is new compared to the MRI of 2013. Levoconvex lumbar curvature is present with the apex at L4. There is a new T12 compression fracture that is pathologic. This shows about 50% loss of central vertebral body height. Metastatic disease is present at every level of the lumbar spine and in both iliac bones. On the inversion recovery images, there also appears to the metastasis in the posterior elements of the sacrum.  T11 shows diffuse metastatic infiltration with extra osseous extension on the right. This is only partially visualized. There is a new inferior endplate T11 pathologic compression fracture. There is mild central stenosis associated with expansion of the T11 vertebral body eccentric to the right without discrete epidural soft tissue tumor.  T12 also shows diffuse metastatic infiltration eccentric to  the right, with expansion of the vertebral body on the right. No central canal invasion.  There is mild central stenosis associated with degenerative disease at L3-L4. Grade I retrolisthesis of L3 on L4 is present associated with collapse of the disk space. Mild lateral recess encroachment. Mild right foraminal stenosis is present at L3-L4 and L4-L5 associated with degenerative disk, facet disease and endplate osteophytes. There is no soft tissue mass in the paraspinal musculature. There is edema in the  inferior lumbar musculature due which is nonspecific. Paraspinal soft tissues demonstrate a hydropic gallbladder with gallstone. Distension of the gallbladder is probably due to fasting.  IMPRESSION: 1. 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 This was also demonstrated on PET-CT recently. 2. Pathologic compression fractures of T11 and T12. T11 is only partially visualized but there is mild central canal compromise associated with expansion of the right side of the vertebral body. T12 shows 50% loss of vertebral body height centrally with no retropulsion. A thoracic spine MRI with and without contrast is recommended to further evaluate the thoracic metastatic disease and T11 compression fracture. 3. There is no mass in the paraspinal musculature. No posterior soft tissue tumor.   Electronically Signed   By: Andreas Newport M.D.   On: 01/08/2013 09:24    Medications: I have reviewed the patient's current medications.  Assessment/Plan:  1.Destructive lesion in the proximal right femur, status post intramedullary rodding of the right femur 04/23/2011, the pathology confirmed metastatic carcinoma. The immunohistochemical profile is not consistent with colon cancer.  - Staging CT evaluation confirming a transverse colon mass, multiple small lung nodules, a lytic lesion at the sternum, and an expansile lesion at the proximal right femur  -Staging PET scan 05/22/2012 with 2 hypermetabolic left liver lesions and multiple bone metastases. Hypermetabolic transverse colon lesion. No apparent primary tumor site.  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  -Restaging PET scan 10/07/2012 after 4 cycles of gemcitabine/cisplatin confirmed improvement in the liver/bone metastases  -Cycle 6 of  gemcitabine/cisplatin completed on 10/31/2012.  2. History of Red cell microcytosis  3. Pain secondary to the right femur lesion -resolved  4. Transverse colon mass-status post a colonoscopy 04/25/2012 confirming a transverse colon mass, biopsy consistent with multiple fragments of a tubular adenoma.  5. Upper endoscopy 04/30/2012 with no evidence of malignancy.  6. Syncope event 05/05/2012-negative noncontrast brain CT 05/05/2012. Presyncope 10/16/2012 felt to be secondary to volume depletion.  7. Pain at the right side of the skull with associated "dizziness" March 2014. No rash to suggest a zoster infection. Negative brain MRI. His symptoms resolved  8. Pain at the right lower paraspinous area-MRI on 01/08/2013 confirmed extensive tumor involving the thoracolumbar spine with compression fractures at T11, T12. He completed palliative radiation to the T9-L1 levels 01/12/2013 through 01/27/2013.  9. Nausea-potentially related to radiation, metastatic disease involving the liver/abdomen, or polypharmacy. He continues to have intermittent nausea/vomiting.  Disposition-Mr. Kelly completed the course of palliative radiation earlier today. He continues to have a poor performance status.   We will see him back for reevaluation on 02/06/2013. If his performance status has improved salvage chemotherapy will be considered.  He will continue MS Contin and Dilaudid for pain. A new Dilaudid prescription was provided today.  He will contact the office in the interim with any problems.  Plan reviewed with Dr. Truett Perna.  Ned Card ANP/GNP-BC

## 2013-01-27 NOTE — Telephone Encounter (Signed)
gv pt appt schedule for October.  °

## 2013-01-27 NOTE — Progress Notes (Signed)
Patient at [facility] throwing up yellow/green bile, cool moist wash cloths on patient neck and head, patient holding emesis basin, weak, asked patient when was last nausea med taken,patient stated 7am but it came up just now, explained to patient medication has already been absorbed in stomach, no c/o pain, wbrought patient to nursing, vitals taken, Dr.moody here and ordered 0.5mg  ativan sl, and to treat his last tx today if possible, gave patient 0.5mg  ativan sl x1, waited 5 minutes gave water to sip, patient tolerated well, no dry heaves, no more emesis,stated felt better since stomach is empty, nausea resolved, took patient back to [facility] 3 to have last radiation treatment, gae 1 month f/u appt card , ,patient doesn't need to come back to be sen per Dr.Moody, in same block and his nausea a lot better Vitals wnl shown to MD 9:34 AM

## 2013-01-31 ENCOUNTER — Other Ambulatory Visit: Payer: Self-pay | Admitting: Oncology

## 2013-02-02 ENCOUNTER — Other Ambulatory Visit: Payer: 59 | Admitting: Lab

## 2013-02-02 ENCOUNTER — Ambulatory Visit: Payer: 59 | Admitting: Oncology

## 2013-02-04 ENCOUNTER — Telehealth: Payer: Self-pay | Admitting: Oncology

## 2013-02-04 NOTE — Telephone Encounter (Signed)
Pt called and r/s appt to next week and nurse notified

## 2013-02-06 ENCOUNTER — Ambulatory Visit: Payer: 59 | Admitting: Nurse Practitioner

## 2013-02-08 ENCOUNTER — Other Ambulatory Visit: Payer: Self-pay | Admitting: Oncology

## 2013-02-09 ENCOUNTER — Telehealth: Payer: Self-pay | Admitting: Oncology

## 2013-02-09 ENCOUNTER — Ambulatory Visit (HOSPITAL_BASED_OUTPATIENT_CLINIC_OR_DEPARTMENT_OTHER): Payer: 59 | Admitting: Nurse Practitioner

## 2013-02-09 ENCOUNTER — Telehealth: Payer: Self-pay | Admitting: *Deleted

## 2013-02-09 ENCOUNTER — Other Ambulatory Visit: Payer: Self-pay | Admitting: Oncology

## 2013-02-09 VITALS — BP 107/67 | HR 123 | Temp 98.1°F | Resp 18 | Ht 75.0 in

## 2013-02-09 DIAGNOSIS — C787 Secondary malignant neoplasm of liver and intrahepatic bile duct: Secondary | ICD-10-CM

## 2013-02-09 DIAGNOSIS — C7951 Secondary malignant neoplasm of bone: Secondary | ICD-10-CM

## 2013-02-09 DIAGNOSIS — R112 Nausea with vomiting, unspecified: Secondary | ICD-10-CM

## 2013-02-09 DIAGNOSIS — C801 Malignant (primary) neoplasm, unspecified: Secondary | ICD-10-CM

## 2013-02-09 MED ORDER — ONDANSETRON HCL 8 MG PO TABS
8.0000 mg | ORAL_TABLET | Freq: Once | ORAL | Status: AC
  Administered 2013-02-09: 8 mg via ORAL

## 2013-02-09 MED ORDER — HYDROMORPHONE HCL 4 MG PO TABS: 4.0000 mg | ORAL_TABLET | ORAL | Status: DC | PRN

## 2013-02-09 NOTE — Progress Notes (Addendum)
OFFICE PROGRESS NOTE  Interval history:  Mrs. a 67 year old man with metastatic carcinoma of unknown primary. He is seen today for scheduled followup.  He feels that overall he is somewhat better. He continues to have intermittent nausea/vomiting. He vomited while in the office today. He states that he became "hot" in the exam room and then vomited. Back pain is some better. He continues Dilaudid every 4-6 hours. He denies shortness of breath. No fevers or chills. He spends the majority of the day in bed. No falls. Bowels moving regularly. No hematuria or dysuria.   Objective: Blood pressure 107/67, pulse 123, temperature 98.1 F (36.7 C), temperature source Oral, resp. rate 18, height 6\' 3"  (1.905 m), weight 0 lb (0 kg).  Oropharynx is without thrush or ulceration. Lungs are clear. Regular cardiac rhythm. Abdomen is soft. No hepatomegaly. Trace ankle edema. Calves soft and nontender. Leg strength intact. Alert and oriented. No skin rash.  Lab Results: Lab Results  Component Value Date   WBC 8.6 01/20/2013   HGB 12.3* 01/20/2013   HCT 38.1* 01/20/2013   MCV 81.2 01/20/2013   PLT 267 01/20/2013    Chemistry:    Chemistry      Component Value Date/Time   NA 140 10/31/2012 0826   NA 138 10/16/2012 1025   K 4.3 10/31/2012 0826   K 5.3* 10/16/2012 1025   CL 104 10/16/2012 1025   CL 103 09/19/2012 0848   CO2 26 10/31/2012 0826   CO2 27 10/16/2012 1025   BUN 11.4 10/31/2012 0826   BUN 14 10/16/2012 1025   CREATININE 1.0 10/31/2012 0826   CREATININE 0.89 10/16/2012 1025      Component Value Date/Time   CALCIUM 9.0 10/31/2012 0826   CALCIUM 9.0 10/16/2012 1025   ALKPHOS 118 10/31/2012 0826   ALKPHOS 119* 05/05/2012 0826   AST 14 10/31/2012 0826   AST 19 05/05/2012 0826   ALT 8 10/31/2012 0826   ALT 12 05/05/2012 0826   BILITOT 0.25 10/31/2012 0826   BILITOT 0.6 05/05/2012 0826       Studies/Results: No results found.  Medications: I have reviewed the patient's current  medications.  Assessment/Plan:  1.Destructive lesion in the proximal right femur, status post intramedullary rodding of the right femur 04/23/2011, the pathology confirmed metastatic carcinoma. The immunohistochemical profile is not consistent with colon cancer.  - Staging CT evaluation confirming a transverse colon mass, multiple small lung nodules, a lytic lesion at the sternum, and an expansile lesion at the proximal right femur  -Staging PET scan 05/22/2012 with 2 hypermetabolic left liver lesions and multiple bone metastases. Hypermetabolic transverse colon lesion. No apparent primary tumor site.  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  -Restaging PET scan 10/07/2012 after 4 cycles of gemcitabine/cisplatin confirmed improvement in the liver/bone metastases  -Cycle 6 of gemcitabine/cisplatin completed on 10/31/2012.  2. History of red cell microcytosis  3. Pain secondary to the right femur lesion -resolved  4. Transverse colon mass-status post a colonoscopy 04/25/2012 confirming a transverse colon mass, biopsy consistent with multiple fragments of a tubular adenoma.  5. Upper endoscopy 04/30/2012 with no evidence of malignancy.  6. Syncope event 05/05/2012-negative noncontrast brain CT 05/05/2012. Presyncope 10/16/2012 felt to be secondary to volume depletion.  7. Pain at the right side of the skull with associated "dizziness" March 2014. No rash to suggest a zoster infection. Negative brain  MRI. His symptoms resolved  8. Pain at the right lower paraspinous area-MRI on 01/08/2013 confirmed extensive tumor involving the thoracolumbar spine with compression fractures at T11, T12. He completed palliative radiation to the T9-L1 levels 01/12/2013 through 01/27/2013.  9. Nausea-potentially related to radiation,  metastatic disease involving the liver/abdomen, or polypharmacy. He continues to have intermittent nausea/vomiting.  Disposition-Mr. Izzo continues to have a poor performance status. He feels he will be ready to begin chemotherapy in one to 2 weeks. Dr. Truett Perna discussed Taxotere on a weekly schedule. We reviewed potential toxicities including myelosuppression, nausea, mouth sores, diarrhea, skin rash, allergic reaction, hair loss.   Mr. Lofgren would like to proceed with chemotherapy beginning 02/20/2013. We will see him in followup prior to treatment that day. He will contact the office in the interim with any problems.  30 minutes were spent face-to-face with the patient at today's visit. The majority of that time was spent in counseling/coordination of care.  Patient seen with Dr. Truett Perna.   Lonna Cobb ANP/GNP-BC  This was a shared visit with Lonna Cobb. I discussed systemic treatment options with Mr. Fitchett. The plan is to begin salvage therapy with weekly Taxotere if his performance status improves over the next week. I think it would be difficult for him to tolerate higher dose Taxotere given on a 3 week schedule. He will return for an office visit on 02/20/2013.  Mancel Bale, M.D.

## 2013-02-09 NOTE — Telephone Encounter (Signed)
Per staff message and POF I have scheduled appts.  JMW  

## 2013-02-09 NOTE — Telephone Encounter (Signed)
gv and printed appt sched and avs for pt for NOV.l.emaield MW to add tx.

## 2013-02-15 NOTE — Progress Notes (Signed)
  Radiation Oncology         [phone] ________________________________  Name: Justin Henson MRN: [id number]  Date: 01/27/2013  DOB: 09-Feb-1946  End of Treatment Note  Diagnosis:   Metastatic carcinoma     Indication for treatment:  Palliative       Radiation treatment dates:   01/12/2013 through 01/27/2013  Site/dose:   The patient was treated concurrently to a left rib metastasis and the T9-L1 vertebral bodies. These represented 2 separate target areas. The patient received 30 gray in 10 fractions at 3 gray per fraction. A total of 4 customized fields were used.  Narrative: The patient tolerated radiation treatment relatively well.   The patient did not exhibit any significant acute toxicity with the exception of some nausea towards the end of treatment.  Plan: The patient has completed radiation treatment. The patient will return to radiation oncology clinic for routine followup in one month. I advised the patient to call or return sooner if they have any questions or concerns related to their recovery or treatment. ________________________________  Radene Gunning, M.D., Ph.D.

## 2013-02-19 ENCOUNTER — Other Ambulatory Visit: Payer: Self-pay

## 2013-02-20 ENCOUNTER — Other Ambulatory Visit: Payer: Self-pay | Admitting: Oncology

## 2013-02-20 ENCOUNTER — Ambulatory Visit (HOSPITAL_BASED_OUTPATIENT_CLINIC_OR_DEPARTMENT_OTHER): Payer: 59

## 2013-02-20 ENCOUNTER — Other Ambulatory Visit (HOSPITAL_BASED_OUTPATIENT_CLINIC_OR_DEPARTMENT_OTHER): Payer: 59 | Admitting: Lab

## 2013-02-20 ENCOUNTER — Ambulatory Visit (HOSPITAL_BASED_OUTPATIENT_CLINIC_OR_DEPARTMENT_OTHER): Payer: 59 | Admitting: Nurse Practitioner

## 2013-02-20 VITALS — BP 120/77 | HR 110 | Temp 97.4°F | Resp 18

## 2013-02-20 DIAGNOSIS — R112 Nausea with vomiting, unspecified: Secondary | ICD-10-CM

## 2013-02-20 DIAGNOSIS — C7951 Secondary malignant neoplasm of bone: Secondary | ICD-10-CM

## 2013-02-20 DIAGNOSIS — C801 Malignant (primary) neoplasm, unspecified: Secondary | ICD-10-CM

## 2013-02-20 DIAGNOSIS — C787 Secondary malignant neoplasm of liver and intrahepatic bile duct: Secondary | ICD-10-CM

## 2013-02-20 DIAGNOSIS — R5381 Other malaise: Secondary | ICD-10-CM

## 2013-02-20 DIAGNOSIS — R911 Solitary pulmonary nodule: Secondary | ICD-10-CM

## 2013-02-20 LAB — CBC WITH DIFFERENTIAL/PLATELET
Basophils Absolute: 0 10*3/uL (ref 0.0–0.1)
EOS%: 0.8 % (ref 0.0–7.0)
Eosinophils Absolute: 0.1 10*3/uL (ref 0.0–0.5)
HCT: 45.3 % (ref 38.4–49.9)
HGB: 14.7 g/dL (ref 13.0–17.1)
LYMPH%: 5.5 % — ABNORMAL LOW (ref 14.0–49.0)
MONO#: 2 10*3/uL — ABNORMAL HIGH (ref 0.1–0.9)
NEUT#: 10.9 10*3/uL — ABNORMAL HIGH (ref 1.5–6.5)
NEUT%: 79.2 % — ABNORMAL HIGH (ref 39.0–75.0)
Platelets: 70 10*3/uL — ABNORMAL LOW (ref 140–400)
WBC: 13.8 10*3/uL — ABNORMAL HIGH (ref 4.0–10.3)
lymph#: 0.8 10*3/uL — ABNORMAL LOW (ref 0.9–3.3)

## 2013-02-20 LAB — COMPREHENSIVE METABOLIC PANEL (CC13)
ALT: 10 U/L (ref 0–55)
AST: 32 U/L (ref 5–34)
Anion Gap: 16 mEq/L — ABNORMAL HIGH (ref 3–11)
BUN: 14.6 mg/dL (ref 7.0–26.0)
CO2: 30 mEq/L — ABNORMAL HIGH (ref 22–29)
Calcium: 11.7 mg/dL — ABNORMAL HIGH (ref 8.4–10.4)
Chloride: 94 mEq/L — ABNORMAL LOW (ref 98–109)
Creatinine: 0.7 mg/dL (ref 0.7–1.3)
Glucose: 84 mg/dl (ref 70–140)
Total Bilirubin: 0.74 mg/dL (ref 0.20–1.20)

## 2013-02-20 MED ORDER — DEXAMETHASONE 4 MG PO TABS: 4.0000 mg | ORAL_TABLET | Freq: Two times a day (BID) | ORAL | Status: DC

## 2013-02-20 MED ORDER — SODIUM CHLORIDE 0.9 % IV SOLN
INTRAVENOUS | Status: AC
  Administered 2013-02-20: 12:00:00 via INTRAVENOUS

## 2013-02-20 NOTE — Progress Notes (Addendum)
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 He is scheduled to begin weekly Taxotere today.  He continues to have fairly persistent nausea/vomiting. Nausea medications are intermittently effective. He is tolerating liquids without difficulty. He denies any unusual headaches or vision change. Appetite overall is poor. He reports increasing weakness. He spends the majority of the day in bed. He came to the office today by EMS because he was too weak to get into the car. Last bowel movement was one week ago. He reports pain is well-controlled on current regimen of MS Contin and Dilaudid. No fever or chills. He has an occasional cough. No shortness of breath. No hematuria or dysuria.  Objective: Blood pressure 120/77, pulse 110, temperature 97.4 F (36.3 C), temperature source Oral, resp. rate 18, weight 0 lb (0 kg).  Mild white coating over tongue. Mucous membranes appear somewhat dry. Inspiratory rales at right lung base. Lungs otherwise clear. Regular cardiac rhythm. Abdomen soft and nontender. No hepatomegaly. No leg edema. The right lower leg appears slightly larger than the left lower leg. Calves are soft and nontender. Moves all extremities. No skin rash. Alert and oriented.  Lab Results: Lab Results  Component Value Date   WBC 13.8* 02/20/2013   HGB 14.7 02/20/2013   HCT 45.3 02/20/2013   MCV 80.3 02/20/2013   PLT 70* 02/20/2013    Chemistry:    Chemistry      Component Value Date/Time   NA 140 10/31/2012 0826   NA 138 10/16/2012 1025   K 4.3 10/31/2012 0826   K 5.3* 10/16/2012 1025   CL 104 10/16/2012 1025   CL 103 09/19/2012 0848   CO2 26 10/31/2012 0826   CO2 27 10/16/2012 1025   BUN 11.4 10/31/2012 0826   BUN 14 10/16/2012 1025   CREATININE 1.0 10/31/2012 0826   CREATININE 0.89 10/16/2012 1025      Component Value Date/Time   CALCIUM 9.0 10/31/2012 0826   CALCIUM 9.0 10/16/2012 1025   ALKPHOS 118 10/31/2012 0826   ALKPHOS 119* 05/05/2012 0826   AST 14 10/31/2012 0826   AST 19 05/05/2012 0826   ALT 8 10/31/2012 0826   ALT 12 05/05/2012 0826   BILITOT 0.25 10/31/2012 0826   BILITOT 0.6 05/05/2012 0826       Studies/Results: No results found.  Medications: I have reviewed the patient's current medications.  Assessment/Plan:  1.Destructive lesion in the proximal right femur, status post intramedullary rodding of the right femur 04/23/2011, the pathology confirmed metastatic carcinoma. The immunohistochemical profile is not consistent with colon cancer.  - Staging CT evaluation confirming a transverse colon mass, multiple small lung nodules, a lytic lesion at the sternum, and an expansile lesion at the proximal right femur  -Staging PET scan 05/22/2012 with 2 hypermetabolic left liver lesions and multiple bone metastases. Hypermetabolic transverse colon lesion. No apparent primary tumor site.  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  -Restaging PET scan 10/07/2012 after 4 cycles of gemcitabine/cisplatin confirmed improvement in the liver/bone metastases  -Cycle 6 of gemcitabine/cisplatin completed on 10/31/2012.  2. History of red cell microcytosis  3. Pain secondary to the right femur lesion -resolved  4. Transverse colon mass-status post a colonoscopy 04/25/2012 confirming a transverse colon mass, biopsy consistent with multiple fragments of a tubular adenoma.  5. Upper endoscopy 04/30/2012 with no  evidence of malignancy.  6. Syncope event 05/05/2012-negative noncontrast brain CT 05/05/2012. Presyncope 10/16/2012 felt to be secondary to volume depletion.  7. Pain at the right side of the skull with associated "dizziness" March 2014. No rash to suggest a zoster infection. Negative brain MRI. His symptoms resolved  8. Pain at  the right lower paraspinous area-MRI on 01/08/2013 confirmed extensive tumor involving the thoracolumbar spine with compression fractures at T11, T12. He completed palliative radiation to the T9-L1 levels 01/12/2013 through 01/27/2013.  9. Nausea-potentially related to radiation, metastatic disease involving the liver/abdomen, or polypharmacy. He continues to have intermittent nausea/vomiting. The nausea/vomiting has been worse over the past week.  Disposition-Justin Henson' performance status continues to decline. He understands the progressive decline in his condition is most likely secondary to the cancer. He does not appear to be a candidate for chemotherapy at present. Dr. Truett Perna reviewed this with Justin Henson and his wife.  Dr. Truett Perna recommended a referral to the [facility] program. Justin Henson and his wife are in agreement. CODE STATUS/end-of-life issues were discussed. He will be placed on NO CODE BLUE status.  He is likely dehydrated. We will administer a liter of IV fluids in the office today and also check a chemistry panel.  The persistent nausea may be due to progressive disease in the liver. He will begin Decadron 4 mg twice daily.  He will return for a followup visit with Dr. Truett Perna in 2 weeks. He will contact the office prior to that visit with any problems.  Patient seen with Dr. Truett Perna.  30 minutes were spent face-to-face at today's visit with the majority of the time involved in counseling/coordination of care.   Lonna Cobb ANP/GNP-BC   This was a shared visit with Lonna Cobb.  Justin Henson has a poor performance status. He is not a candidate for further chemotherapy in his current condition. I suspect his symptoms are related to progression of the metastatic carcinoma. We decided to begin a trial of Decadron for the nausea. He agrees to a [facility] referral. We discussed CPR and ACLS issues. He will be placed on a no CODE BLUE status.  Mancel Bale, M.D.

## 2013-02-20 NOTE — Patient Instructions (Signed)
Dehydration, Adult Dehydration is when you lose more fluids from the body than you take in. Vital organs like the kidneys, brain, and heart cannot function without a proper amount of fluids and salt. Any loss of fluids from the body can cause dehydration.  CAUSES   Vomiting.  Diarrhea.  Excessive sweating.  Excessive urine output.  Fever. SYMPTOMS  Mild dehydration  Thirst.  Dry lips.  Slightly dry mouth. Moderate dehydration  Very dry mouth.  Sunken eyes.  Skin does not bounce back quickly when lightly pinched and released.  Dark urine and decreased urine production.  Decreased tear production.  Headache. Severe dehydration  Very dry mouth.  Extreme thirst.  Rapid, weak pulse (more than 100 beats per minute at rest).  Cold hands and feet.  Not able to sweat in spite of heat and temperature.  Rapid breathing.  Blue lips.  Confusion and lethargy.  Difficulty being awakened.  Minimal urine production.  No tears. DIAGNOSIS  Your caregiver will diagnose dehydration based on your symptoms and your exam. Blood and urine tests will help confirm the diagnosis. The diagnostic evaluation should also identify the cause of dehydration. TREATMENT  Treatment of mild or moderate dehydration can often be done at home by increasing the amount of fluids that you drink. It is best to drink small amounts of fluid more often. Drinking too much at one time can make vomiting worse. Refer to the home care instructions below. Severe dehydration needs to be treated at the hospital where you will probably be given intravenous (IV) fluids that contain water and electrolytes. HOME CARE INSTRUCTIONS   Ask your caregiver about specific rehydration instructions.  Drink enough fluids to keep your urine clear or pale yellow.  Drink small amounts frequently if you have nausea and vomiting.  Eat as you normally do.  Avoid:  Foods or drinks high in sugar.  Carbonated  drinks.  Juice.  Extremely hot or cold fluids.  Drinks with caffeine.  Fatty, greasy foods.  Alcohol.  Tobacco.  Overeating.  Gelatin desserts.  Wash your hands well to avoid spreading bacteria and viruses.  Only take over-the-counter or prescription medicines for pain, discomfort, or fever as directed by your caregiver.  Ask your caregiver if you should continue all prescribed and over-the-counter medicines.  Keep all follow-up appointments with your caregiver. SEEK MEDICAL CARE IF:  You have abdominal pain and it increases or stays in one area (localizes).  You have a rash, stiff neck, or severe headache.  You are irritable, sleepy, or difficult to awaken.  You are weak, dizzy, or extremely thirsty. SEEK IMMEDIATE MEDICAL CARE IF:   You are unable to keep fluids down or you get worse despite treatment.  You have frequent episodes of vomiting or diarrhea.  You have blood or green matter (bile) in your vomit.  You have blood in your stool or your stool looks black and tarry.  You have not urinated in 6 to 8 hours, or you have only urinated a small amount of very dark urine.  You have a fever.  You faint. MAKE SURE YOU:   Understand these instructions.  Will watch your condition.  Will get help right away if you are not doing well or get worse. Document Released: 04/02/2005 Document Revised: 06/25/2011 Document Reviewed: 11/20/2010 [employer] Patient Information 2014 [employer].  

## 2013-02-23 ENCOUNTER — Telehealth: Payer: Self-pay | Admitting: Oncology

## 2013-02-23 ENCOUNTER — Ambulatory Visit: Payer: 59

## 2013-02-23 ENCOUNTER — Telehealth: Payer: Self-pay | Admitting: *Deleted

## 2013-02-23 ENCOUNTER — Other Ambulatory Visit: Payer: Self-pay | Admitting: *Deleted

## 2013-02-23 DIAGNOSIS — C801 Malignant (primary) neoplasm, unspecified: Secondary | ICD-10-CM

## 2013-02-23 NOTE — Telephone Encounter (Signed)
S/w pt confirming appt for 11/11.

## 2013-02-23 NOTE — Telephone Encounter (Signed)
Made patient aware of elevation of his Calcium level and need for Zometa per Dr. Truett Perna. He confirms he is pushing fluids at home. POF to scheduler.

## 2013-02-23 NOTE — Telephone Encounter (Signed)
S/w pt to confirm 11/11 appt. Pt to get new schedule tomorrow. Also added 11/21 and pt has existing appt for 11/14.

## 2013-02-24 ENCOUNTER — Ambulatory Visit (HOSPITAL_BASED_OUTPATIENT_CLINIC_OR_DEPARTMENT_OTHER): Payer: 59

## 2013-02-24 ENCOUNTER — Other Ambulatory Visit: Payer: Self-pay | Admitting: Oncology

## 2013-02-24 VITALS — BP 126/81 | HR 104 | Temp 97.0°F | Resp 17

## 2013-02-24 DIAGNOSIS — C7951 Secondary malignant neoplasm of bone: Secondary | ICD-10-CM

## 2013-02-24 DIAGNOSIS — C801 Malignant (primary) neoplasm, unspecified: Secondary | ICD-10-CM

## 2013-02-24 DIAGNOSIS — C787 Secondary malignant neoplasm of liver and intrahepatic bile duct: Secondary | ICD-10-CM

## 2013-02-24 MED ORDER — SODIUM CHLORIDE 0.9 % IV SOLN
Freq: Once | INTRAVENOUS | Status: AC
  Administered 2013-02-24: 17:00:00 via INTRAVENOUS

## 2013-02-24 MED ORDER — ZOLEDRONIC ACID 4 MG/100ML IV SOLN
4.0000 mg | Freq: Once | INTRAVENOUS | Status: AC
  Administered 2013-02-24: 4 mg via INTRAVENOUS
  Filled 2013-02-24: qty 100

## 2013-02-24 NOTE — Patient Instructions (Signed)

## 2013-02-25 ENCOUNTER — Encounter: Payer: Self-pay | Admitting: Nurse Practitioner

## 2013-02-25 ENCOUNTER — Other Ambulatory Visit: Payer: Self-pay | Admitting: *Deleted

## 2013-02-25 ENCOUNTER — Telehealth: Payer: Self-pay | Admitting: Oncology

## 2013-02-25 DIAGNOSIS — C801 Malignant (primary) neoplasm, unspecified: Secondary | ICD-10-CM

## 2013-02-25 MED ORDER — HYDROMORPHONE HCL 4 MG PO TABS: 4.0000 mg | ORAL_TABLET | ORAL | Status: DC | PRN

## 2013-02-25 MED ORDER — MORPHINE SULFATE ER 30 MG PO TBCR: 30.0000 mg | EXTENDED_RELEASE_TABLET | Freq: Two times a day (BID) | ORAL | Status: DC

## 2013-02-25 NOTE — Telephone Encounter (Signed)
Talked to pt and gave him appt for 11/14th lab,md and chemo

## 2013-02-26 ENCOUNTER — Telehealth: Payer: Self-pay | Admitting: *Deleted

## 2013-02-26 DIAGNOSIS — C801 Malignant (primary) neoplasm, unspecified: Secondary | ICD-10-CM

## 2013-02-26 DIAGNOSIS — C7951 Secondary malignant neoplasm of bone: Secondary | ICD-10-CM

## 2013-02-26 NOTE — Telephone Encounter (Signed)
Thought chemo for 11/14 was supposed to be cancelled and rescheduled for 11/21. Cancelled as it was supposed to be, but made him aware at last visit patient was made Hospice with DNR status on 02/20/13. He did not seem to understand that his cancer had progressed this far. Seems to think he only has a couple spots in bones only and does not understand why chemo would be stopped. Strongly encouraged to be present at the the next appointment. Reminded him that last PET scan was in June and much can change in 5 months with his cancer status. Called and confirmed with [facility] that he was admitted on 02/25/13. Called home and left message for wife to please call office to talk with nurse (she needs to speak with son about his condition).

## 2013-02-27 ENCOUNTER — Ambulatory Visit: Payer: 59 | Admitting: Oncology

## 2013-02-27 ENCOUNTER — Ambulatory Visit: Payer: 59

## 2013-02-27 ENCOUNTER — Other Ambulatory Visit: Payer: 59 | Admitting: Lab

## 2013-02-27 ENCOUNTER — Telehealth: Payer: Self-pay | Admitting: *Deleted

## 2013-02-27 MED ORDER — BISACODYL 10 MG RE SUPP: 10.0000 mg | RECTAL | Status: AC | PRN

## 2013-02-27 NOTE — Telephone Encounter (Signed)
Requests to have earlier scripts of MS Contin & Dilaudid faxed to [facility] on [address] so wife will not need to pick them up. Also needs script for box of Dulcolox supp for constipation. Nurse reports patient is comfortable, but very weak and she is doubtful he would be well enough for chemo next week. Feels he will may even need ambulance transport to get to office visit on 11/21. Wife even seems to think he may be strong enough for chemo next week. They made family aware that he will not be eligible for Hospice if he is having chemotherapy.

## 2013-03-02 ENCOUNTER — Telehealth: Payer: Self-pay | Admitting: *Deleted

## 2013-03-02 NOTE — Telephone Encounter (Signed)
Call from [place] with hospice reporting pt had a great deal of vomiting yesterday. Seems to be resolving today with Zofran. Was unable to take pain meds due to N/V. Asking if Dr. Truett Perna recommends changing antiemetic or pain med? Called pt's wife, she reports nausea is relieved with Zofran. Hydromorphone is adequate for pain at this time. She understands to call hospice RN or this office if pt is unable to tolerate PO meds.

## 2013-03-04 ENCOUNTER — Telehealth: Payer: Self-pay | Admitting: *Deleted

## 2013-03-04 NOTE — Telephone Encounter (Signed)
Call from [place], [facility] RN with an update: Pt has not been out of bed in 5 or 6 days but remains hopeful he will get chemo at next office visit. Unsure if wife will actually be able to get pt into office. Just wanted to make Dr. Truett Perna aware of pt's expectation.

## 2013-03-05 ENCOUNTER — Ambulatory Visit: Payer: 59 | Admitting: Radiation Oncology

## 2013-03-06 ENCOUNTER — Ambulatory Visit: Payer: 59 | Admitting: Oncology

## 2013-03-06 ENCOUNTER — Other Ambulatory Visit: Payer: 59

## 2013-03-09 ENCOUNTER — Telehealth: Payer: Self-pay | Admitting: *Deleted

## 2013-03-09 NOTE — Telephone Encounter (Signed)
Missed his appointment next week-calling to check on him. Was going to try to come, but he was having a lot of diarrhea and was afraid to try to get out. Per Dr. Truett Perna: If it becomes too difficult for him to come out Dr. Truett Perna would like Dr. Gibson Ramp to check on him as needed. She will visit him tomorrow and call with update.

## 2013-03-10 ENCOUNTER — Telehealth: Payer: Self-pay | Admitting: *Deleted

## 2013-03-10 NOTE — Telephone Encounter (Signed)
Home visit today: Pain well controlled. Patient wants to reschedule with Dr. Truett Perna as soon as possible and wants to resume aggressive chemotherapy. Told nurse if Dr. Truett Perna is not willing to give him chemo, he would find a physician who would. When/if he begins chemo, Hospice will have to be discontinued. They will continue to serve him until that time. He has some thrush, so Dr. Gibson Ramp ordered MMW and fluconazole.

## 2013-03-10 NOTE — Telephone Encounter (Signed)
Per Dr. Truett Perna: Schedule for 12/1 at 12:30. Called patient with the appointment and he accepted it. Also requested it be put in My Chart message for him.

## 2013-03-16 ENCOUNTER — Telehealth: Payer: Self-pay | Admitting: *Deleted

## 2013-03-16 ENCOUNTER — Other Ambulatory Visit: Payer: Self-pay | Admitting: *Deleted

## 2013-03-16 ENCOUNTER — Ambulatory Visit (HOSPITAL_BASED_OUTPATIENT_CLINIC_OR_DEPARTMENT_OTHER): Payer: 59 | Admitting: Oncology

## 2013-03-16 VITALS — BP 99/75 | HR 127 | Temp 94.6°F | Resp 20

## 2013-03-16 DIAGNOSIS — C7951 Secondary malignant neoplasm of bone: Secondary | ICD-10-CM

## 2013-03-16 DIAGNOSIS — B370 Candidal stomatitis: Secondary | ICD-10-CM

## 2013-03-16 DIAGNOSIS — K6389 Other specified diseases of intestine: Secondary | ICD-10-CM

## 2013-03-16 DIAGNOSIS — K7689 Other specified diseases of liver: Secondary | ICD-10-CM

## 2013-03-16 DIAGNOSIS — C801 Malignant (primary) neoplasm, unspecified: Secondary | ICD-10-CM

## 2013-03-16 DIAGNOSIS — F411 Generalized anxiety disorder: Secondary | ICD-10-CM

## 2013-03-16 MED ORDER — ONDANSETRON HCL 4 MG PO TABS: 4.0000 mg | ORAL_TABLET | Freq: Two times a day (BID) | ORAL | Status: DC | PRN

## 2013-03-16 MED ORDER — METHOCARBAMOL 500 MG PO TABS: 500.0000 mg | ORAL_TABLET | Freq: Three times a day (TID) | ORAL | Status: AC | PRN

## 2013-03-16 MED ORDER — ALPRAZOLAM 0.5 MG PO TABS: 0.5000 mg | ORAL_TABLET | Freq: Three times a day (TID) | ORAL | Status: AC | PRN

## 2013-03-16 NOTE — Telephone Encounter (Signed)
appts made and printed. Pt is aware that tx will be added. i emailed MW to add the tx...td 

## 2013-03-16 NOTE — Telephone Encounter (Signed)
Per staff message and POF I have scheduled appts.  JMW  

## 2013-03-16 NOTE — Progress Notes (Signed)
[facility]    OFFICE PROGRESS NOTE   INTERVAL HISTORY:   [name] enrolled in the [facility] hospice program after we saw him on 02/20/2013. The calcium returned elevated and he was treated with Zometa on 02/24/2013.  He denies pain and is not taking pain medication. He remains confined to the bed and a wheelchair. He is able to use the bedside commode with assistance from his family. No nausea at present. His appetite returned after he began treatment for oral candidiasis last week.  Justin Henson would like to be treated with salvage systemic chemotherapy. He would like to be placed on a full CODE STATUS.  Objective:  Vital signs in last 24 hours:  Blood pressure 99/75, pulse 127, temperature 94.6 F (34.8 C), temperature source Oral, resp. rate 20, weight 0 lb (0 kg).    HEENT: No thrush or ulcers Resp: Decreased breath sounds at the right lower chest, no respiratory distress Cardio: Tachycardia, regular rhythm GI: Nontender, no hepatomegaly, no mass Vascular: No leg edema Neuro: Alert, follows commands, diminished proximal leg strength bilaterally. Good strength with knee extension and hip abduction. Mild decrease in biceps strength bilaterally.     Lab Results:  Lab Results  Component Value Date   WBC 13.8* 02/20/2013   HGB 14.7 02/20/2013   HCT 45.3 02/20/2013   MCV 80.3 02/20/2013   PLT 70* 02/20/2013      Medications: I have reviewed the patient's current medications.  Assessment/Plan: 1.Destructive lesion in the proximal right femur, status post intramedullary rodding of the right femur 04/23/2011, the pathology confirmed metastatic carcinoma. The immunohistochemical profile is not consistent with colon cancer.  - Staging CT evaluation confirming a transverse colon mass, multiple small lung nodules, a lytic lesion at the sternum, and an expansile lesion at the proximal right femur  -Staging PET scan 05/22/2012 with 2 hypermetabolic left liver  lesions and multiple bone metastases. Hypermetabolic transverse colon lesion. No apparent primary tumor site.  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  -Restaging PET scan 10/07/2012 after 4 cycles of gemcitabine/cisplatin confirmed improvement in the liver/bone metastases  -Cycle 6 of gemcitabine/cisplatin completed on 10/31/2012.  2. History of red cell microcytosis  3. Pain secondary to the right femur lesion -resolved  4. Transverse colon mass-status post a colonoscopy 04/25/2012 confirming a transverse colon mass, biopsy consistent with multiple fragments of a tubular adenoma.  5. Upper endoscopy 04/30/2012 with no evidence of malignancy.  6. Syncope event 05/05/2012-negative noncontrast brain CT 05/05/2012. Presyncope 10/16/2012 felt to be secondary to volume depletion.  7. Pain at the right side of the skull with associated "dizziness" March 2014. No rash to suggest a zoster infection. Negative brain MRI. His symptoms resolved  8. Pain at the right lower paraspinous area-MRI on 01/08/2013 confirmed extensive tumor involving the thoracolumbar spine with compression fractures at T11, T12. He completed palliative radiation to the T9-L1 levels 01/12/2013 through 01/27/2013.  9. Nausea-potentially related to radiation, metastatic disease involving the liver/abdomen, or polypharmacy. Resolved 10. Hypercalcemia 02/20/2013-status post Zometa 02/24/2013 11. Oral candidiasis-currently completing a course of Diflucan   Disposition:  Justin Henson continues to have a poor performance status, though he appears slightly improved compared to when he was here on 02/20/2013. He arrived by Car today as opposed to EMS on 02/20/2013. We discussed his current status and treatment options. His performance status remains inadequate  to receive systemic therapy. He would like to improve his strength and diet over the next few weeks and reconsider weekly Taxotere. He will return for an office visit and scheduled Taxotere chemotherapy 04/02/2013.  We reviewed CPR and ACLS issues. He has decided to change to a full CODE STATUS. He understands he cannot remain in the hospice program if he is receiving chemotherapy.  He declined a chemistry panel today for followup of the hypercalcemia.   Thornton Papas, MD  03/16/2013  2:10 PM

## 2013-03-17 ENCOUNTER — Telehealth: Payer: Self-pay | Admitting: *Deleted

## 2013-03-17 ENCOUNTER — Other Ambulatory Visit: Payer: Self-pay | Admitting: *Deleted

## 2013-03-17 DIAGNOSIS — C801 Malignant (primary) neoplasm, unspecified: Secondary | ICD-10-CM

## 2013-03-17 NOTE — Telephone Encounter (Signed)
Message from [name] with hospice requesting Zofran refill to be sent to pt's pharmacy-- Done 03/16/13. She also reports pt would like labs done by hospice day before chemo. Reviewed with Dr. Truett Perna: CBC and CMET to be drawn by hospice RN day before chemo.

## 2013-04-01 ENCOUNTER — Telehealth: Payer: Self-pay | Admitting: *Deleted

## 2013-04-01 NOTE — Telephone Encounter (Signed)
Message from [place], [place] with hospice reporting pt declined to have labs drawn at home. Would rather have finger stick done in office. Pt is being discharged from hospice today due to his decision to resume chemotherapy.

## 2013-04-02 ENCOUNTER — Telehealth: Payer: Self-pay | Admitting: Oncology

## 2013-04-02 ENCOUNTER — Ambulatory Visit (HOSPITAL_BASED_OUTPATIENT_CLINIC_OR_DEPARTMENT_OTHER): Payer: 59

## 2013-04-02 ENCOUNTER — Other Ambulatory Visit: Payer: Self-pay | Admitting: Oncology

## 2013-04-02 ENCOUNTER — Ambulatory Visit (HOSPITAL_BASED_OUTPATIENT_CLINIC_OR_DEPARTMENT_OTHER): Payer: 59 | Admitting: Oncology

## 2013-04-02 ENCOUNTER — Other Ambulatory Visit (HOSPITAL_BASED_OUTPATIENT_CLINIC_OR_DEPARTMENT_OTHER): Payer: 59

## 2013-04-02 VITALS — BP 103/77 | HR 125 | Temp 96.8°F | Resp 18 | Ht 75.0 in

## 2013-04-02 DIAGNOSIS — C7951 Secondary malignant neoplasm of bone: Secondary | ICD-10-CM

## 2013-04-02 DIAGNOSIS — C787 Secondary malignant neoplasm of liver and intrahepatic bile duct: Secondary | ICD-10-CM

## 2013-04-02 DIAGNOSIS — C801 Malignant (primary) neoplasm, unspecified: Secondary | ICD-10-CM

## 2013-04-02 LAB — CBC WITH DIFFERENTIAL/PLATELET
Basophils Absolute: 0 10*3/uL (ref 0.0–0.1)
EOS%: 0.3 % (ref 0.0–7.0)
HCT: 42.2 % (ref 38.4–49.9)
HGB: 14.2 g/dL (ref 13.0–17.1)
MCH: 26.6 pg — ABNORMAL LOW (ref 27.2–33.4)
MCHC: 33.6 g/dL (ref 32.0–36.0)
MCV: 79 fL — ABNORMAL LOW (ref 79.3–98.0)
MONO#: 1.2 10*3/uL — ABNORMAL HIGH (ref 0.1–0.9)
MONO%: 11.1 % (ref 0.0–14.0)
NEUT#: 8.2 10*3/uL — ABNORMAL HIGH (ref 1.5–6.5)
RDW: 17.9 % — ABNORMAL HIGH (ref 11.0–14.6)
WBC: 10.6 10*3/uL — ABNORMAL HIGH (ref 4.0–10.3)
lymph#: 1.1 10*3/uL (ref 0.9–3.3)

## 2013-04-02 LAB — COMPREHENSIVE METABOLIC PANEL (CC13)
AST: 42 U/L — ABNORMAL HIGH (ref 5–34)
Albumin: 2.8 g/dL — ABNORMAL LOW (ref 3.5–5.0)
Alkaline Phosphatase: 212 U/L — ABNORMAL HIGH (ref 40–150)
BUN: 8 mg/dL (ref 7.0–26.0)
Chloride: 92 mEq/L — ABNORMAL LOW (ref 98–109)
Glucose: 98 mg/dl (ref 70–140)
Potassium: 4 mEq/L (ref 3.5–5.1)
Sodium: 135 mEq/L — ABNORMAL LOW (ref 136–145)
Total Bilirubin: 0.92 mg/dL (ref 0.20–1.20)
Total Protein: 6.6 g/dL (ref 6.4–8.3)

## 2013-04-02 MED ORDER — SODIUM CHLORIDE 0.9 % IV SOLN
Freq: Once | INTRAVENOUS | Status: AC
  Administered 2013-04-02: 11:00:00 via INTRAVENOUS

## 2013-04-02 MED ORDER — ZOLEDRONIC ACID 4 MG/100ML IV SOLN
4.0000 mg | Freq: Once | INTRAVENOUS | Status: AC
  Administered 2013-04-02: 4 mg via INTRAVENOUS
  Filled 2013-04-02: qty 100

## 2013-04-02 NOTE — Patient Instructions (Addendum)

## 2013-04-02 NOTE — Progress Notes (Signed)
Pt declined to be weighed on wheelchair scale. Stated he could not transfer out of wheelchair.

## 2013-04-02 NOTE — Telephone Encounter (Signed)
gv and printed appt sched and avs for pt for DEC....sed added tx. °

## 2013-04-02 NOTE — Progress Notes (Signed)
[facility]    OFFICE PROGRESS NOTE   INTERVAL HISTORY:   He returns for scheduled visit. He would like to proceed with a trial of Taxotere. He discontinued hospice services yesterday Mr. Bahe reports feeling "sore "all over. He takes Dilaudid for pain. He relates a soreness to exercise. He reports taking in the bed the majority of the time. He occasionally gets into a chair with the help with his son and wife. He eats 2 meals per day. He reports drinking adequate fluids. He is unable to ambulate.  Objective:  Vital signs in last 24 hours:  Blood pressure 103/77, pulse 125, temperature 96.8 F (36 C), temperature source Oral, resp. rate 18, height 6\' 3"  (1.905 m), weight 0 lb (0 kg).    HEENT: No thrush, the mucous membranes are moist Resp: Lungs clear bilaterally Cardio: Regular rate and rhythm, tachycardia GI: No hepatomegaly, nontender Vascular: No leg edema Neuro: Decreased proximal leg strength bilaterally      Lab Results:  Lab Results  Component Value Date   WBC 10.6* 04/02/2013   HGB 14.2 04/02/2013   HCT 42.2 04/02/2013   MCV 79.0* 04/02/2013   PLT 213 04/02/2013   ANC 8.2  BUN 8, creatinine 0.5, calcium 10.2, albumin 2.8    Medications: I have reviewed the patient's current medications.  Assessment/Plan: 1.Destructive lesion in the proximal right femur, status post intramedullary rodding of the right femur 04/23/2011, the pathology confirmed metastatic carcinoma. The immunohistochemical profile is not consistent with colon cancer.  - Staging CT evaluation confirming a transverse colon mass, multiple small lung nodules, a lytic lesion at the sternum, and an expansile lesion at the proximal right femur  -Staging PET scan 05/22/2012 with 2 hypermetabolic left liver lesions and multiple bone metastases. Hypermetabolic transverse colon lesion. No apparent primary tumor site.  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  -Restaging PET scan 10/07/2012 after 4 cycles of gemcitabine/cisplatin confirmed improvement in the liver/bone metastases  -Cycle 6 of gemcitabine/cisplatin completed on 10/31/2012.  2. History of red cell microcytosis  3. Pain secondary to the right femur lesion -resolved  4. Transverse colon mass-status post a colonoscopy 04/25/2012 confirming a transverse colon mass, biopsy consistent with multiple fragments of a tubular adenoma.  5. Upper endoscopy 04/30/2012 with no evidence of malignancy.  6. Syncope event 05/05/2012-negative noncontrast brain CT 05/05/2012. Presyncope 10/16/2012 felt to be secondary to volume depletion.  7. Pain at the right side of the skull with associated "dizziness" March 2014. No rash to suggest a zoster infection. Negative brain MRI. His symptoms resolved  8. Pain at the right lower paraspinous area-MRI on 01/08/2013 confirmed extensive tumor involving the thoracolumbar spine with compression fractures at T11, T12. He completed palliative radiation to the T9-L1 levels 01/12/2013 through 01/27/2013.  9. Nausea-potentially related to radiation, metastatic disease involving the liver/abdomen, or polypharmacy. Resolved  10. Hypercalcemia 02/20/2013-status post Zometa 02/24/2013 , improved   Disposition:  Justin Henson has a poor performance status, ECoG 3. I discussed the indication for chemotherapy with Mr. Blatz and his wife. I do not feel chemotherapy is indicated with his poor performance status. He understands the chance of a clinical response with chemotherapy is small and chemotherapy may be associated with increased toxicity. We decided to administer Zometa today. He will return for an office visit and reevaluation for chemotherapy on 04/10/2013.  I  will recommend he resume hospice services  if his performance status is not improved next week.  Ms. Kuhl appears to understand the current situation and she is in agreement with the above plan.   Thornton Papas, MD  04/02/2013  12:52 PM

## 2013-04-05 ENCOUNTER — Encounter: Payer: Self-pay | Admitting: Oncology

## 2013-04-07 ENCOUNTER — Other Ambulatory Visit: Payer: Self-pay | Admitting: *Deleted

## 2013-04-07 ENCOUNTER — Encounter: Payer: Self-pay | Admitting: *Deleted

## 2013-04-07 NOTE — Progress Notes (Signed)
At family request, Dr. Truett Perna gave OK to move 12/26 l/ov/chemo to 04/24/13. POF to scheduler and email to patient.

## 2013-04-08 ENCOUNTER — Telehealth: Payer: Self-pay | Admitting: Oncology

## 2013-04-08 NOTE — Telephone Encounter (Signed)
s.w. pt and advised on appt moved from 12.26.14 to 1.9.14 per 12.23.14 pof...pt ok and aware

## 2013-04-10 ENCOUNTER — Ambulatory Visit: Payer: 59 | Admitting: Nurse Practitioner

## 2013-04-10 ENCOUNTER — Other Ambulatory Visit: Payer: 59

## 2013-04-10 ENCOUNTER — Ambulatory Visit: Payer: 59

## 2013-04-20 ENCOUNTER — Encounter: Payer: Self-pay | Admitting: Oncology

## 2013-04-21 ENCOUNTER — Other Ambulatory Visit: Payer: Self-pay | Admitting: *Deleted

## 2013-04-21 DIAGNOSIS — C801 Malignant (primary) neoplasm, unspecified: Secondary | ICD-10-CM

## 2013-04-21 MED ORDER — HYDROMORPHONE HCL 4 MG PO TABS: 4.0000 mg | ORAL_TABLET | ORAL | Status: AC | PRN

## 2013-04-22 ENCOUNTER — Other Ambulatory Visit: Payer: Self-pay | Admitting: Oncology

## 2013-04-23 ENCOUNTER — Other Ambulatory Visit: Payer: Self-pay | Admitting: Oncology

## 2013-04-23 ENCOUNTER — Telehealth: Payer: Self-pay | Admitting: Oncology

## 2013-04-23 ENCOUNTER — Encounter: Payer: Self-pay | Admitting: Oncology

## 2013-04-23 NOTE — Telephone Encounter (Signed)
Pt is sick and wants to r/s appts lab,ML  for 1/9/14and chemo, nurse notified

## 2013-04-24 ENCOUNTER — Ambulatory Visit: Payer: 59

## 2013-04-24 ENCOUNTER — Other Ambulatory Visit: Payer: 59

## 2013-04-24 ENCOUNTER — Telehealth: Payer: Self-pay | Admitting: Dietician

## 2013-04-24 ENCOUNTER — Telehealth: Payer: Self-pay | Admitting: *Deleted

## 2013-04-24 ENCOUNTER — Ambulatory Visit: Payer: 59 | Admitting: Nurse Practitioner

## 2013-04-24 NOTE — Telephone Encounter (Signed)
Brief Outpatient Oncology Nutrition Note  Patient has been identified to be at risk on malnutrition screen.  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  Patient is mostly wheelchair bound.  Called patient.  Weight has been increasing overall but appetite is poor.  Forcing self to eat.  Drinks 1 Ensure mixed with strawberry Ensure daily as well as a protein supplement that he puts on his food.  Patient reports to questions at this time.    Reinforced nutrition tips.  [place] RD available as needed.  Antonieta Iba, RD, LDN

## 2013-04-24 NOTE — Telephone Encounter (Signed)
Message from pt's wife requesting to reschedule missed office visit. Orders entered. [name] made aware.

## 2013-04-25 ENCOUNTER — Encounter: Payer: Self-pay | Admitting: Oncology

## 2013-04-27 ENCOUNTER — Telehealth: Payer: Self-pay | Admitting: *Deleted

## 2013-04-27 ENCOUNTER — Other Ambulatory Visit: Payer: Self-pay | Admitting: *Deleted

## 2013-04-27 DIAGNOSIS — C801 Malignant (primary) neoplasm, unspecified: Secondary | ICD-10-CM

## 2013-04-27 MED ORDER — MAGIC MOUTHWASH: 5.0000 mL | Freq: Four times a day (QID) | ORAL | Status: AC | PRN

## 2013-04-27 NOTE — Telephone Encounter (Signed)
Called spouse to inquire about what pharmacy to send MMW order.  Sent to [facility] on [address]. Per her request

## 2013-04-28 ENCOUNTER — Telehealth: Payer: Self-pay | Admitting: Oncology

## 2013-04-28 NOTE — Telephone Encounter (Signed)
called pt re appt for 1/16 and s/w his mother who asked that I call [name] at work [phone]). lmonvm for Justin Henson re appt for 1/16 @ 1:30 pm and mailed schedule. Justin Henson personally identifed on vm.

## 2013-04-30 ENCOUNTER — Telehealth: Payer: Self-pay | Admitting: *Deleted

## 2013-04-30 ENCOUNTER — Encounter: Payer: Self-pay | Admitting: Oncology

## 2013-04-30 ENCOUNTER — Emergency Department (HOSPITAL_COMMUNITY): Payer: 59

## 2013-04-30 ENCOUNTER — Inpatient Hospital Stay (HOSPITAL_COMMUNITY)
Admission: EM | Admit: 2013-04-30 | Discharge: 2013-05-17 | DRG: 189 | Disposition: E | Payer: 59 | Attending: Pulmonary Disease | Admitting: Pulmonary Disease

## 2013-04-30 DIAGNOSIS — I959 Hypotension, unspecified: Secondary | ICD-10-CM

## 2013-04-30 DIAGNOSIS — R64 Cachexia: Secondary | ICD-10-CM

## 2013-04-30 DIAGNOSIS — R0989 Other specified symptoms and signs involving the circulatory and respiratory systems: Secondary | ICD-10-CM

## 2013-04-30 DIAGNOSIS — R069 Unspecified abnormalities of breathing: Secondary | ICD-10-CM

## 2013-04-30 DIAGNOSIS — C801 Malignant (primary) neoplasm, unspecified: Secondary | ICD-10-CM

## 2013-04-30 DIAGNOSIS — C799 Secondary malignant neoplasm of unspecified site: Secondary | ICD-10-CM | POA: Diagnosis present

## 2013-04-30 DIAGNOSIS — R0602 Shortness of breath: Secondary | ICD-10-CM | POA: Diagnosis present

## 2013-04-30 DIAGNOSIS — J690 Pneumonitis due to inhalation of food and vomit: Secondary | ICD-10-CM | POA: Diagnosis present

## 2013-04-30 DIAGNOSIS — M129 Arthropathy, unspecified: Secondary | ICD-10-CM | POA: Diagnosis present

## 2013-04-30 DIAGNOSIS — R0603 Acute respiratory distress: Secondary | ICD-10-CM | POA: Diagnosis present

## 2013-04-30 DIAGNOSIS — C221 Intrahepatic bile duct carcinoma: Secondary | ICD-10-CM | POA: Diagnosis present

## 2013-04-30 DIAGNOSIS — R627 Adult failure to thrive: Secondary | ICD-10-CM | POA: Diagnosis present

## 2013-04-30 DIAGNOSIS — E873 Alkalosis: Secondary | ICD-10-CM | POA: Diagnosis present

## 2013-04-30 DIAGNOSIS — J9600 Acute respiratory failure, unspecified whether with hypoxia or hypercapnia: Principal | ICD-10-CM | POA: Diagnosis present

## 2013-04-30 DIAGNOSIS — R0609 Other forms of dyspnea: Secondary | ICD-10-CM

## 2013-04-30 LAB — POCT I-STAT 3, ART BLOOD GAS (G3+)
Acid-Base Excess: 3 mmol/L — ABNORMAL HIGH (ref 0.0–2.0)
Bicarbonate: 24.9 mEq/L — ABNORMAL HIGH (ref 20.0–24.0)
O2 SAT: 100 %
PO2 ART: 290 mmHg — AB (ref 80.0–100.0)
TCO2: 26 mmol/L (ref 0–100)
pCO2 arterial: 31 mmHg — ABNORMAL LOW (ref 35.0–45.0)
pH, Arterial: 7.513 — ABNORMAL HIGH (ref 7.350–7.450)

## 2013-04-30 LAB — GLUCOSE, CAPILLARY: GLUCOSE-CAPILLARY: 79 mg/dL (ref 70–99)

## 2013-04-30 MED ORDER — FENTANYL CITRATE 0.05 MG/ML IJ SOLN
50.0000 ug | Freq: Once | INTRAMUSCULAR | Status: AC
  Administered 2013-04-30: 50 ug via INTRAVENOUS

## 2013-04-30 MED ORDER — HEPARIN (PORCINE) IN NACL 100-0.45 UNIT/ML-% IJ SOLN
1350.0000 [IU]/h | INTRAMUSCULAR | Status: DC
  Administered 2013-04-30: 1350 [IU]/h via INTRAVENOUS
  Filled 2013-04-30: qty 250

## 2013-04-30 MED ORDER — ONDANSETRON HCL 4 MG/2ML IJ SOLN: 4.0000 mg | Freq: Four times a day (QID) | INTRAMUSCULAR | Status: DC | PRN

## 2013-04-30 MED ORDER — FENTANYL CITRATE 0.05 MG/ML IJ SOLN
25.0000 ug | Freq: Once | INTRAMUSCULAR | Status: AC
  Administered 2013-04-30: 25 ug via INTRAVENOUS

## 2013-04-30 MED ORDER — FENTANYL CITRATE 0.05 MG/ML IJ SOLN
INTRAMUSCULAR | Status: AC
  Filled 2013-04-30: qty 2

## 2013-04-30 MED ORDER — MAGIC MOUTHWASH
5.0000 mL | Freq: Four times a day (QID) | ORAL | Status: DC | PRN
  Filled 2013-04-30: qty 5

## 2013-04-30 MED ORDER — SODIUM CHLORIDE 0.9 % IV SOLN: INTRAVENOUS | Status: DC

## 2013-04-30 MED ORDER — ACETAMINOPHEN 650 MG RE SUPP: 650.0000 mg | RECTAL | Status: DC | PRN

## 2013-04-30 MED ORDER — FENTANYL CITRATE 0.05 MG/ML IJ SOLN: 25.0000 ug | INTRAMUSCULAR | Status: DC | PRN

## 2013-04-30 MED ORDER — SODIUM CHLORIDE 0.9 % IV SOLN
3.0000 g | Freq: Three times a day (TID) | INTRAVENOUS | Status: DC
  Filled 2013-04-30: qty 3

## 2013-04-30 MED ORDER — PIPERACILLIN-TAZOBACTAM 3.375 G IVPB 30 MIN: 3.3750 g | Freq: Once | INTRAVENOUS | Status: DC

## 2013-04-30 MED ORDER — VANCOMYCIN HCL 10 G IV SOLR
1500.0000 mg | Freq: Once | INTRAVENOUS | Status: DC
  Filled 2013-04-30: qty 1500

## 2013-04-30 MED ORDER — SODIUM CHLORIDE 0.9 % IV BOLUS (SEPSIS)
500.0000 mL | Freq: Once | INTRAVENOUS | Status: AC
  Administered 2013-04-30: 1000 mL via INTRAVENOUS

## 2013-04-30 MED ORDER — FENTANYL CITRATE 0.05 MG/ML IJ SOLN
25.0000 ug | INTRAMUSCULAR | Status: DC | PRN
  Administered 2013-04-30: 25 ug via INTRAVENOUS

## 2013-04-30 MED ORDER — ACETAMINOPHEN 325 MG PO TABS: 650.0000 mg | ORAL_TABLET | Freq: Four times a day (QID) | ORAL | Status: DC | PRN

## 2013-04-30 MED ORDER — HEPARIN BOLUS VIA INFUSION
5000.0000 [IU] | Freq: Once | INTRAVENOUS | Status: AC
  Administered 2013-04-30: 5000 [IU] via INTRAVENOUS

## 2013-04-30 MED ORDER — SODIUM CHLORIDE 0.9 % IV BOLUS (SEPSIS)
1000.0000 mL | Freq: Once | INTRAVENOUS | Status: AC
  Administered 2013-04-30: 1000 mL via INTRAVENOUS

## 2013-04-30 MED ORDER — SODIUM CHLORIDE 0.9 % IV SOLN
INTRAVENOUS | Status: DC
  Administered 2013-04-30: 20 mL/h via INTRAVENOUS

## 2013-05-01 ENCOUNTER — Other Ambulatory Visit: Payer: 59

## 2013-05-01 ENCOUNTER — Ambulatory Visit: Payer: 59 | Admitting: Nurse Practitioner

## 2013-05-01 ENCOUNTER — Ambulatory Visit: Payer: 59

## 2013-05-01 NOTE — Discharge Summary (Signed)
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 is a 68 y.o. male w/ PMHx of Arthritis extensive metastatic disease, presents to the ED w/ complaints of shortness of breath. History obtained by the wife, at bedside. She claims the patient has had poor po intake over the last several days, but today, had a gurgling "cough" and then had some SOB thereafter, becoming significant enough to justify coming to the ED. The patient has never had difficulty breathing in the past.  The patient follows w/ Dr. 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 Staging CT in the past confirmed colonic, pulmonary, hepatic, and multiple bony metastatic lesions.   HOSPITAL COURSE:   He was initially supported with BiPAP and received IVFs for hypotension. PCCM was asked to admit pt to ICU. In the ED, after thorough review of records and bedside evaluation, Dr Alva Garnet undertook extensive discussions with pt's wife and son defining goals of care and advanced directives. 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 He has been in progressive decline with severe weakness and cachexia @ this point. He has been on Hospice until recently. This was apparently rescinded so that further chemotherapy could be considered though, per his wife, Dr Benay Spice was not willing to administer further chemotherapy unless his performance status improved.  He presented with sudden onset of severe dyspnea after a  choking episode while drinking water. He is marginally supported on NPPV with a measured Ve of > 30 lpm. His CXR reveals no edema or infiltrates.  His exam reveals marked cachexia, poor oral hygiene, no discernible JVD, clear lung fields anteriorly, obscured heart sounds, tachycardia, no murmurs noted, scaphoid abdomen which is nontender and asymmetric L>R ankle edema  The most likely potential causes of his respiratory distress are aspiration pneumonitis not yet evident on CXR vs acute PE. I favor the latter in light of his clear CXR and markedly elevated Ve.  Given the end stage nature of his cancer, I have undertaken extended discussions re: advanced directives.  We will provide IVFs, abx, heparin, NPPV and fentanyl as needed for pain or dyspnea. We will not undertake mechanical ventilation, vasopressors or ACLS. The wife and son are aware that his likelihood of surviving to discharge is small  Shortly after transfer to the ICU, respirations ceased and he lost a perfusing rhythm. He passed away peacefully. No autopsy was performed   Cause of death:  Acute respiratory failure Suspected aspiration event  Contributing factors:  Metastatic carcinoma Cachexia Adult FTT Chronic nausea and vomiting  Autopsy: No  Smoking: No   Merton Border, MD;  PCCM service; Mobile [phone]

## 2013-05-06 LAB — CULTURE, BLOOD (ROUTINE X 2): Culture: NO GROWTH

## 2013-05-17 NOTE — ED Notes (Signed)
CCM arrived at bedside and spoke with the wife about quality of care and patients outcome. Waiting for patients son to arrive.

## 2013-05-17 NOTE — H&P (Signed)
Name: Justin Henson MRN: [id number] DOB: 12/10/1945    ADMISSION DATE:  2013/05/22 CONSULTATION DATE:  2013/05/22  REFERRING MD :  ED PRIMARY SERVICE: PCCM  CHIEF COMPLAINT:  Shortness of breath  BRIEF PATIENT DESCRIPTION: [name] is a 68 y.o. male w/ PMHx of Arthritis extensive metastatic disease, presents to the ED w/ complaints of shortness of breath.   SIGNIFICANT EVENTS / STUDIES:  1/15 - Admitted 1/15 - Place on BiPAP.  LINES / TUBES:   CULTURES: Blood 1/15 >>> Urine 1/15 >>>  ANTIBIOTICS: Vancomycin 1/15 >>> 1/15 Zosyn 1/15 >>> 1/15 Unasyn 1/15 >>>  HISTORY OF PRESENT ILLNESS:  [name] is a 68 y.o. male w/ PMHx of Arthritis extensive metastatic disease, presents to the ED w/ complaints of shortness of breath. History obtained by the wife, at bedside. She claims the patient has had poor po intake over the last several days, but today, had a gurgling "cough" and then had some SOB thereafter, becoming significant enough to justify coming to the ED. The patient has never had difficulty breathing in the past.  The patient follows w/ Dr. 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 Staging CT in the past confirmed colonic, pulmonary, hepatic, and multiple bony metastatic lesions.  PAST MEDICAL HISTORY :  Past Medical History  Diagnosis Date  . Cancer 04/24/2012    lesions currently under diagnosis  . Shortness of breath     on exertion  . Arthritis   . Syncope   . Hypotension   . Radiation 05/15/12-06/04/12    completed treatment/30 gray in 10 fx's  . Liver mass    Past Surgical History  Procedure Laterality Date  . Femur im nail  04/22/2012    Procedure: INTRAMEDULLARY (IM) NAIL FEMORAL;  Surgeon: Alta Corning, MD;  Location: [place];  Service: Orthopedics;  Laterality: Right;  PROPHYLACTIC  IM ROD RIGHT FEMUR   . Colonoscopy  04/25/2012    Procedure: COLONOSCOPY;  Surgeon: Jeryl Columbia, MD;   Location: [facility] ENDOSCOPY;  Service: Endoscopy;  Laterality: N/A;  . Esophagogastroduodenoscopy  04/30/2012    Procedure: ESOPHAGOGASTRODUODENOSCOPY (EGD);  Surgeon: Jeryl Columbia, MD;  Location: [facility] ENDOSCOPY;  Service: Endoscopy;  Laterality: N/A;   Prior to Admission medications   Medication Sig Start Date End Date Taking? Authorizing Provider  acetaminophen (TYLENOL) 500 MG tablet Take 1,000 mg by mouth every 6 (six) hours as needed for pain.    Historical Provider, MD  ALPRAZolam Duanne Moron) 0.5 MG tablet Take 1 tablet (0.5 mg total) by mouth 3 (three) times daily as needed for sleep or anxiety. 03/16/13   Ladell Pier, MD  Alum & Mag Hydroxide-Simeth (MAGIC MOUTHWASH) SOLN Take 5 mLs by mouth 4 (four) times daily as needed for mouth pain. Swish and spit. 04/27/13   Ladell Pier, MD  bisacodyl (DULCOLAX) 10 MG suppository Place 1 suppository (10 mg total) rectally as needed for moderate constipation. 02/27/13   Ladell Pier, MD  HYDROmorphone (DILAUDID) 4 MG tablet Take 1-2 tablets (4-8 mg total) by mouth every 4 (four) hours as needed for severe pain. 04/21/13   Ladell Pier, MD  methocarbamol (ROBAXIN) 500 MG tablet Take 1 tablet (500 mg total) by mouth every 8 (eight) hours as needed for muscle spasms. 03/16/13   Ladell Pier, MD  Multiple Vitamin (MULTIVITAMIN WITH MINERALS) TABS Take 1 tablet by mouth daily.    Historical Provider, MD  ondansetron (  ZOFRAN) 4 MG tablet TAKE 1 TABLET (4 MG TOTAL) BY MOUTH EVERY 12 (TWELVE) HOURS AS NEEDED FOR NAUSEA. 04/23/13   Ladene Artist, MD  Polyethylene Glycol 3350 (MIRALAX PO) Take 17 g by mouth daily.     Historical Provider, MD  promethazine (PHENERGAN) 12.5 MG tablet Take 1 tablet (12.5 mg total) by mouth every 6 (six) hours as needed for nausea. 01/20/13   Ladene Artist, MD   Allergies  Allergen Reactions  . Promethazine Other (See Comments)    Prolongs nausea-    FAMILY HISTORY:  No family history on file.  SOCIAL HISTORY:  reports  that he has never smoked. He has never used smokeless tobacco. He reports that he does not drink alcohol or use illicit drugs.  REVIEW OF SYSTEMS:  Unable to obtain full ROS. See HPI.  SUBJECTIVE:   VITAL SIGNS: Pulse Rate:  [96] 96 (01/15 1715) Resp:  [48] 48 (01/15 1605) BP: (80-100)/(15-40) 80/40 mmHg (01/15 1740) SpO2:  [86 %-100 %] 86 % (01/15 1715) Weight:  [198 lb 6.6 oz (90 kg)] 198 lb 6.6 oz (90 kg) (01/15 1716) HEMODYNAMICS:   VENTILATOR SETTINGS:   INTAKE / OUTPUT: Intake/Output   None     PHYSICAL EXAMINATION: General: Alert, in no apparent distress. On BiPAP. Cachectic. HEENT: Vision grossly intact, oropharynx clear and non-erythematous  Neck: Full range of motion without pain, supple, no lymphadenopathy or carotid bruits Lungs: Clear to ascultation bilaterally, tachypneic, no wheezes, rales, or crackles.  Heart: Regular rate and rhythm, no murmurs, gallops, or rubs Abdomen: Soft, non-tender, non-distended, normal bowel sounds Extremities: LE edema, L>R. Cool extremities. Pulses intact distally.  Neurologic: Alert, cranial nerves II-XII intact, strength grossly intact, sensation intact to light touch  LABS:  CBC No results found for this basename: WBC, HGB, HCT, PLT,  in the last 168 hours Coag's No results found for this basename: APTT, INR,  in the last 168 hours BMET No results found for this basename: NA, K, CL, CO2, BUN, CREATININE, GLUCOSE,  in the last 168 hours Electrolytes No results found for this basename: CALCIUM, MG, PHOS,  in the last 168 hours Sepsis Markers No results found for this basename: LATICACIDVEN, PROCALCITON, O2SATVEN,  in the last 168 hours ABG  Recent Labs Lab 04/29/2013 1605  PHART 7.513*  PCO2ART 31.0*  PO2ART 290.0*   Liver Enzymes No results found for this basename: AST, ALT, ALKPHOS, BILITOT, ALBUMIN,  in the last 168 hours Cardiac Enzymes No results found for this basename: TROPONINI, PROBNP,  in the last 168  hours Glucose No results found for this basename: GLUCAP,  in the last 168 hours  Imaging Dg Chest Port 1 View  05/01/2013   CLINICAL DATA:  Short of breath, pneumonia  EXAM: PORTABLE CHEST - 1 VIEW  COMPARISON:  Prior chest x-ray 05/05/2012; prior PET-CT 10/07/2012  FINDINGS: Stable cardiac and mediastinal contours which are within normal limits. Mild central bronchitic changes. Negative for focal airspace consolidation, pleural effusion, pneumothorax or pulmonary edema. Interval progression of bony metastatic disease. The left 6 rib is absent along the lateral margin concerning for a large lytic lesion. Additionally, there is a new lytic lesion in the lateral aspect of the left seventh red and increasing conspicuity of a lytic lesion in the posterior aspect the right fourth rib. Destructive lesion in the posterolateral aspect of the right eighth rib as well.  IMPRESSION: 1. No definite acute cardiopulmonary process. 2. Interval progression of a multifocal metastatic disease with new  and worsening bilateral lytic rib lesions. If clinically warranted, further evaluation could be performed with nuclear medicine bone scan or repeat PET-CT.   Electronically Signed   By: Jacqulynn Cadet M.D.   On: 05/01/13 16:40   CXR: Multiple bilateral lytic rib lesions. No obvious cardiopulmonary process.   ASSESSMENT / PLAN:  PULMONARY A: Respiratory distress requiring BiPAP.  Respiratory alkalosis 2/2 tachypnea. PE vs aspiration pneumonia. CXR w/ NACPD. P:   -Heparin gtt -Continue BiPAP. Consider intubation if no change in code status. Currently full code.  -Repeat CXR in AM -Fentanyl 25-100 mcg q1h prn for dyspnea -LE dopplers for DVT  CARDIOVASCULAR A:  Shortness of breath. No h/o CAD. Volume depleted/dehydration on admission. P:  -EKG -Troponin -Given 1L NS in ED. Given another liter bolus -Continue NS @ 125 ml/hr  RENAL A:   No issues P:   -Monitor BMET -IVF as above; continue NS @ 125  ml/hr  GASTROINTESTINAL A:   Cachexia w/ poor appetite. Nutrition.  Metastatic disease. P:   -NPO while on BiPAP -Consult Nutrition in AM -Discuss w/ Dr. Benay Spice in AM. -Fentanyl 25-100 mcg q1h prn for pain -Zofran prn   HEMATOLOGIC A:   No issues. P:  -Trend CBC  INFECTIOUS A:   Possible aspiration pneumonia. CXR w/ NACPD. P:   -Given Vancomycin/Zosyn in ED. -Start Unasyn -Lactic acid, procalcitonin -Trend CBC -Repeat CXR in AM -Blood cultures pending -UA/UCx pending -Tylenol prn   ENDOCRINE A:   No issues.   P:   -Trend BMP  NEUROLOGIC A:   No Issues. P:   -Fentanyl 25-100 mcg q1h prn for pain  Signed: Luanne Bras, MD 05/01/2013 6:03 PM   PCCM ATTENDING: I have interviewed and examined the patient and reviewed the database. I have formulated the assessment and plan as reflected in the note above with amendments made by me.   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 He has been in progressive decline with severe weakness and cachexia @ this point. He has been on Hospice until recently. This was apparently rescinded so that further chemotherapy could be considered though, per his wife, Dr Benay Spice was not willing to administer further chemotherapy unless his performance status improved.   He presented with sudden onset of severe dyspnea after a choking episode while drinking water. He is marginally supported on NPPV with a measured Ve of > 30 lpm. His CXR reveals no edema or infiltrates.   His exam reveals marked cachexia, poor oral hygiene, no discernible JVD, clear lung fields anteriorly, obscured heart sounds, tachycardia, no murmurs noted, scaphoid abdomen which is nontender and asymmetric L>R ankle edema  The most likely potential causes of his respiratory distress are aspiration pneumonitis not yet evident on CXR vs acute PE. I favor the latter in light of his clear CXR and markedly elevated Ve.   Given the end  stage nature of his cancer, I have undertaken extended discussions re: advanced directives.   We will provide IVFs, abx, heparin, NPPV and fentanyl as needed for pain or dyspnea. We will not undertake mechanical ventilation, vasopressors or ACLS. The wife and son are aware that his likelihood of surviving to discharge is small  CCM time 90 mins (45 mins in assessment and implementation of care plan and 45 mins of discussion with wife and son)   Merton Border, MD;  PCCM service; Mobile [phone]

## 2013-05-17 NOTE — ED Provider Notes (Signed)
I saw and evaluated the patient, reviewed the resident's note and I agree with the findings and plan.  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  Per report of family, pt has been unable to get out of bed x 1 month, and not eating for 1 week. Currently on no chemo/rad for ca, having not responded to prior tx and felt too weak to tolerate additional chemo.  Pt had been with hospice care last month.   Discussions w family, and critical care team re tx plan - after discussion with critical care team pt/fam opting for comfort care.    Mirna Mires, MD 05/05/13 [phone]

## 2013-05-17 NOTE — ED Notes (Signed)
Patient arrived via [facility] from home with acute onset of shortness of breath. Patient alert/oriented but lethergic. ST on monitor, EKG ST, CBG 121, rales noted by EMS in all fields. Patient was placed on cpap by EMS. No IV access.

## 2013-05-17 NOTE — ED Provider Notes (Signed)
CSN: [id number]     Arrival date & time 22-May-2013  1549 History   First MD Initiated Contact with Patient 2013/05/22 1629     Chief Complaint  Patient presents with  . 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 Given his clinical condition he is unable to provide a history. His wife states that a few hours ago he was drinking water and became "choked." Shortly after he had relatively acute onset of SOB. His work of breathing quickly worsened, so his wife called EMS. On their arrival he was on a NRB with saturations in the low 90's. They initiated BiPAP and sats improved to upper 90's. On arrival he is unable to speak given his respiratory distress. He answers questions by shaking his head. He denies any chest pain does say yes to abdominal pain. His wife states he has had a progressive functional decline over several months. He has been bed bound for the last month or so. No fever, cough, difficulty breathing, chest pain or diarrhea prior to today.    Past Medical History  Diagnosis Date  . Cancer 04/24/2012    lesions currently under diagnosis  . Shortness of breath     on exertion  . Arthritis   . Syncope   . Hypotension   . Radiation 05/15/12-06/04/12    completed treatment/30 gray in 10 fx's  . Liver mass    Past Surgical History  Procedure Laterality Date  . Femur im nail  04/22/2012    Procedure: INTRAMEDULLARY (IM) NAIL FEMORAL;  Surgeon: Alta Corning, MD;  Location: [place];  Service: Orthopedics;  Laterality: Right;  PROPHYLACTIC  IM ROD RIGHT FEMUR   . Colonoscopy  04/25/2012    Procedure: COLONOSCOPY;  Surgeon: Jeryl Columbia, MD;  Location: [facility] ENDOSCOPY;  Service: Endoscopy;  Laterality: N/A;  . Esophagogastroduodenoscopy  04/30/2012    Procedure: ESOPHAGOGASTRODUODENOSCOPY (EGD);  Surgeon: Jeryl Columbia, MD;  Location: [facility] ENDOSCOPY;  Service: Endoscopy;  Laterality:  N/A;   No family history on file. History  Substance Use Topics  . Smoking status: Never Smoker   . Smokeless tobacco: Never Used  . Alcohol Use: No    Review of Systems  Unable to perform ROS: Acuity of condition     Allergies  Promethazine  Home Medications   Current Outpatient Rx  Name  Route  Sig  Dispense  Refill  . acetaminophen (TYLENOL) 500 MG tablet   Oral   Take 1,000 mg by mouth every 6 (six) hours as needed for pain.         Marland Kitchen ALPRAZolam (XANAX) 0.5 MG tablet   Oral   Take 1 tablet (0.5 mg total) by mouth 3 (three) times daily as needed for sleep or anxiety.   60 tablet   1   . Alum & Mag Hydroxide-Simeth (MAGIC MOUTHWASH) SOLN   Oral   Take 5 mLs by mouth 4 (four) times daily as needed for mouth pain. Swish and spit.   240 mL   2   . bisacodyl (DULCOLAX) 10 MG suppository   Rectal   Place 1 suppository (10 mg total) rectally as needed for moderate constipation.   12 suppository   3     HOSPICE PATIENT   . HYDROmorphone (DILAUDID) 4 MG tablet   Oral   Take 1-2 tablets (4-8 mg total) by mouth every 4 (  four) hours as needed for severe pain.   100 tablet   0   . methocarbamol (ROBAXIN) 500 MG tablet   Oral   Take 1 tablet (500 mg total) by mouth every 8 (eight) hours as needed for muscle spasms.   60 tablet   0   . Multiple Vitamin (MULTIVITAMIN WITH MINERALS) TABS   Oral   Take 1 tablet by mouth daily.         . ondansetron (ZOFRAN) 4 MG tablet      TAKE 1 TABLET (4 MG TOTAL) BY MOUTH EVERY 12 (TWELVE) HOURS AS NEEDED FOR NAUSEA.   20 tablet   1   . Polyethylene Glycol 3350 (MIRALAX PO)   Oral   Take 17 g by mouth daily.          . promethazine (PHENERGAN) 12.5 MG tablet   Oral   Take 1 tablet (12.5 mg total) by mouth every 6 (six) hours as needed for nausea.   30 tablet   1    BP 70/42  Pulse 96  Resp 48  Ht 6' 3.2" (1.91 m)  Wt 198 lb 6.6 oz (90 kg)  BMI 24.67 kg/m2  SpO2 86%  Physical Exam  Nursing note and  vitals reviewed. Constitutional: He appears toxic. He appears distressed. Face mask in place.  Cachectic, elderly male, in significant respiratory distress, communicates by shaking his head.   HENT:  Head: Normocephalic and atraumatic.  Mouth/Throat: Mucous membranes are dry.  BiPAP in place  Eyes: Conjunctivae and EOM are normal. Pupils are equal, round, and reactive to light.  Neck: Neck supple.  Cardiovascular: Regular rhythm, normal heart sounds and intact distal pulses.  Tachycardia present.  Exam reveals decreased pulses (1+ X 4).   Pulmonary/Chest: Accessory muscle usage present. Tachypnea noted. He is in respiratory distress. He has rales (in all lung fileds, worse in bases).  Abdominal: Soft. Bowel sounds are decreased. There is tenderness in the epigastric area. There is no rebound and no guarding.  Musculoskeletal: He exhibits no edema and no tenderness.  Neurological: He is alert.  No obvious focal neurologic deficit.   Skin: Skin is warm and dry. No rash noted. There is pallor.  Psychiatric: His mood appears anxious.     ED Course  Procedures (including critical care time)  Angiocath insertion Performed by: Louretta Shorten  Consent: Verbal consent obtained. Risks and benefits: risks, benefits and alternatives were discussed  Preparation: Patient was prepped and draped in the usual fashion.  Vein Location: Right AC  Ultrasound Guided  Gauge: 20  Normal blood return and flush without difficulty Patient tolerance: Patient tolerated the procedure well with no immediate complications.   Labs Review Labs Reviewed  POCT I-STAT 3, BLOOD GAS (G3+) - Abnormal; Notable for the following:    pH, Arterial 7.513 (*)    pCO2 arterial 31.0 (*)    pO2, Arterial 290.0 (*)    Bicarbonate 24.9 (*)    Acid-Base Excess 3.0 (*)    All other components within normal limits  CULTURE, BLOOD (ROUTINE X 2)  GLUCOSE, CAPILLARY   Imaging Review Dg Chest Port 1 View  05/03/2013    CLINICAL DATA:  Short of breath, pneumonia  EXAM: PORTABLE CHEST - 1 VIEW  COMPARISON:  Prior chest x-ray 05/05/2012; prior PET-CT 10/07/2012  FINDINGS: Stable cardiac and mediastinal contours which are within normal limits. Mild central bronchitic changes. Negative for focal airspace consolidation, pleural effusion, pneumothorax or pulmonary edema. Interval progression of bony metastatic  disease. The left 6 rib is absent along the lateral margin concerning for a large lytic lesion. Additionally, there is a new lytic lesion in the lateral aspect of the left seventh red and increasing conspicuity of a lytic lesion in the posterior aspect the right fourth rib. Destructive lesion in the posterolateral aspect of the right eighth rib as well.  IMPRESSION: 1. No definite acute cardiopulmonary process. 2. Interval progression of a multifocal metastatic disease with new and worsening bilateral lytic rib lesions. If clinically warranted, further evaluation could be performed with nuclear medicine bone scan or repeat PET-CT.   Electronically Signed   By: Jacqulynn Cadet M.D.   On: 15-May-2013 16:40    MDM  68 yo M with extensive metastatic cancer, presents in respiratory distress. Given hypotension and tachycardia concern for SIRS secondary to aspiration pneumonitis vs PNA vs PE. Initial CXR shows worsening of underlying metastatic disease but no PNA, effusion, widened mediastinum or PTX. Difficulty obtaining IV access, US guided IV placed in R AC by myself. ABG shows respiratory alkalosis, minimal metabolic component as HCO3 normal. Blood culture obtained. Vanc and Zosyn ordered. The patient and his wife initially requested everything be done. We consulted critical care who evaluated the patient in the ED. They spoke with the family at length, his wife agreed to comfort care measures. He was maintained on BiPAP, started on heparin gtt and abx's. Moved to ICU in critical but stable condition. His wife was updated on  plan.   Reviewed imaging, labs and previous medical records, utilized in MDM  Discussed case with Dr. Ashok Cordia  Clinical Impression 1. Acute hypoxic respiratory failure 2. SIRS of uncertain origin.  3. Hypotension 4. Metastatic cancer   Louretta Shorten, MD 05/01/13 [phone]

## 2013-05-17 NOTE — ED Notes (Signed)
Family at bedside. 

## 2013-05-17 NOTE — ED Notes (Signed)
Family taken to family room to have private conversation.

## 2013-05-17 NOTE — Progress Notes (Signed)
Patient transported to room 2M01 without event from ED.

## 2013-05-17 NOTE — Telephone Encounter (Signed)
Son called & left VM to report Justin Henson has been taken the the emergency room at [facility] for "a respiratory issue".

## 2013-05-17 NOTE — ED Notes (Signed)
Called dr. Leonidas Romberg to bedside for patients declining status.

## 2013-05-17 NOTE — Progress Notes (Signed)
VASCULAR LAB PRELIMINARY  PRELIMINARY  PRELIMINARY  PRELIMINARY  Bilateral lower extremity venous duplex  completed.    Preliminary report:  Bilateral:  No evidence of DVT, superficial thrombosis, or Baker's Cyst.    Joshuan Bolander, RVT 05/07/2013, 6:56 PM

## 2013-05-17 NOTE — Telephone Encounter (Signed)
Wife asking for nourishment for [place]-asking for tube feedings or something. Doubts he will be able to have chemo tomorrow. Said he stopped eating entirely yesterday. She agrees to keep his visit tomorrow and nutrition can be discussed at this time. Can give IVF instead of the chemo.

## 2013-05-17 NOTE — ED Notes (Signed)
Son arrived at bedside.  

## 2013-05-17 NOTE — Progress Notes (Signed)
05-23-2013 at 1956 Pt stopped breathing. Pt was a DNR and DNI. 2 RNs: Aldona Lento and Karrie Meres assessed pt to be pulseless, with no respirations. Pupils were dilated and non reactive.  MD was notified. Family was made aware and offered support. Official time of death 70.

## 2013-05-17 NOTE — ED Notes (Signed)
U/S venous study at bedside.

## 2013-05-17 NOTE — ED Notes (Signed)
Called report to unit 39M.

## 2013-05-17 NOTE — Progress Notes (Addendum)
ANTICOAGULATION CONSULT NOTE - Initial Consult  Pharmacy Consult for heparin Indication: Pulmonary embolus  Allergies  Allergen Reactions  . Promethazine Other (See Comments)    Prolongs nausea-    Patient Measurements: Height: 6' 3.2" (191 cm) Weight: 198 lb 6.6 oz (90 kg) IBW/kg (Calculated) : 84.95 Heparin Dosing Weight: 85kg  Vital Signs: BP: 100/27 mmHg (01/15 1615)  Labs: No results found for this basename: HGB, HCT, PLT, APTT, LABPROT, INR, HEPARINUNFRC, CREATININE, CKTOTAL, CKMB, TROPONINI,  in the last 72 hours  Estimated Creatinine Clearance: 107.7 ml/min (by C-G formula based on Cr of 0.5).   Medical History: Past Medical History  Diagnosis Date  . Cancer 04/24/2012    lesions currently under diagnosis  . Shortness of breath     on exertion  . Arthritis   . Syncope   . Hypotension   . Radiation 05/15/12-06/04/12    completed treatment/30 gray in 10 fx's  . Liver mass     Assessment: Justin Henson is a 68 y.o. male w/ PMHx of Arthritis extensive metastatic disease, presents to the ED w/ complaints of shortness of breath. Pharmacy consulted to start IV heparin for possible pulmonary embolus. Baseline coags have been ordered, cbc is pending. Patient is not on anticoagulants prior to admission.  Possible aspiration - zosyn x1 given in ED, unasyn ordered by CCM. Renal function has been normal in past.  Goal of Therapy:  Heparin level 0.3-0.7 units/ml Monitor platelets by anticoagulation protocol: Yes   Plan:  Give 5000 units bolus x 1 Start heparin infusion at 1350 units/hr Check anti-Xa level in 6 hours and daily while on heparin Continue to monitor H&H and platelets Unasyn 3g IV q8 hours  Erin Hearing PharmD., BCPS Clinical Pharmacist Pager [phone] 04/27/2013 5:37 PM

## 2013-05-17 DEATH — deceased

## 2013-08-28 IMAGING — PT NM PET TUM IMG INITIAL (PI) SKULL BASE T - THIGH
6 series · 25 of 25 positions shown · non-contrast
Comparison: CT chest abdomen pelvis dated 04/21/2012.

CLINICAL DATA: Initial treatment strategy for metastatic cancer to
right femur, unknown primary.

NUCLEAR MEDICINE PET SKULL BASE TO THIGH
Fasting Blood Glucose:  97
TECHNIQUE: 18.6 mCi F-18 FDG was injected intravenously. CT data
was obtained and used for attenuation correction and anatomic
localization only.  (This was not acquired as a diagnostic CT
examination.) Additional exam technical data entered on
technologist worksheet.

[Series 1: pet ac · axial · 3.3mm · 4.69mm/px · z∈[-870,+0]mm · 5 of 267 slices shown]
[im 1/267]
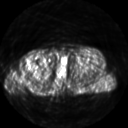
[im 67/267]
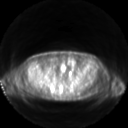
[im 134/267]
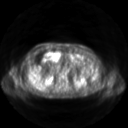
[im 200/267]
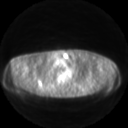
[im 267/267]
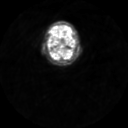

[Series 2: ct images · axial · 3.8mm · 0.98mm/px · z∈[-870,+0]mm · 5 of 267 slices shown]
[im 1/267]
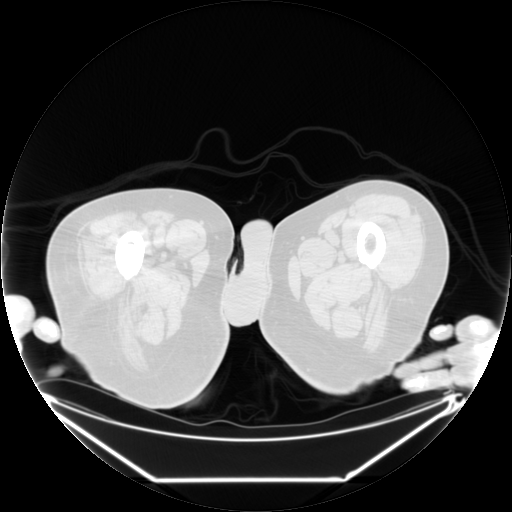
[im 67/267]
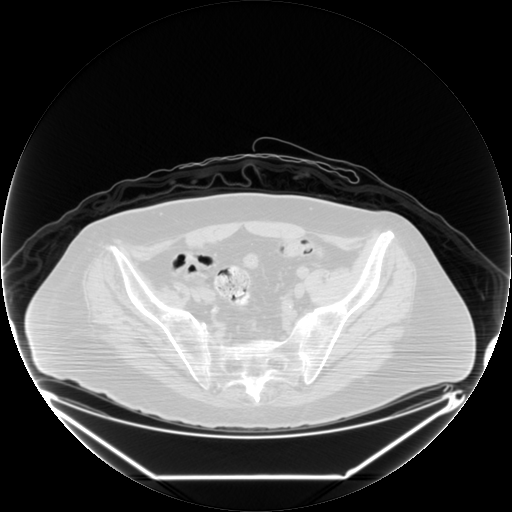
[im 134/267]
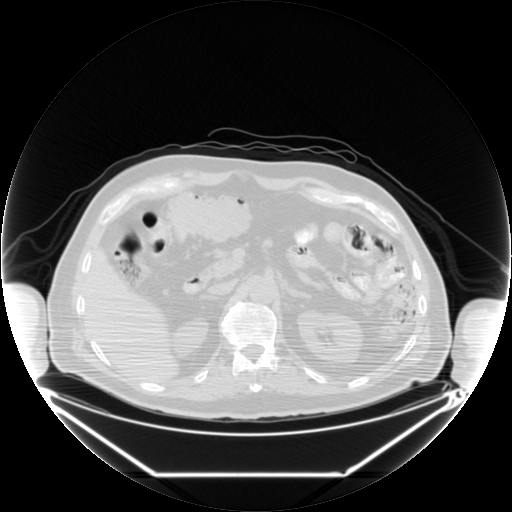
[im 200/267]
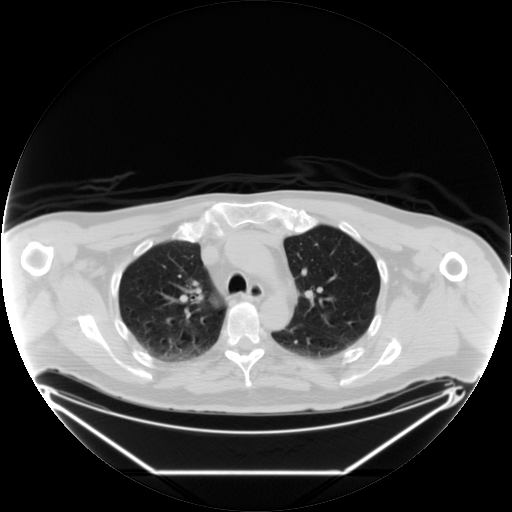
[im 267/267  brain]
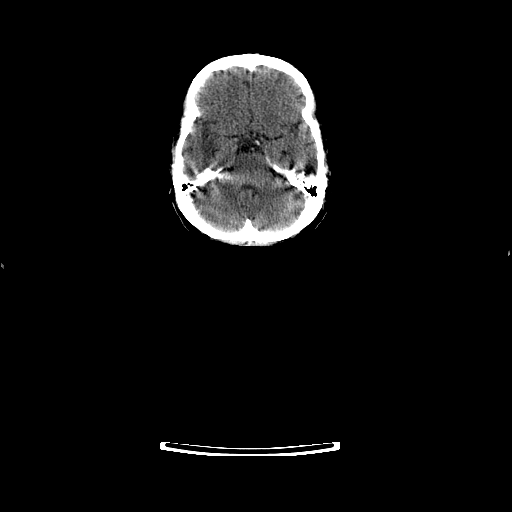

[Series 2: pet nac · axial · 3.3mm · 4.69mm/px · z∈[-870,+0]mm · 6 of 267 slices shown]
[im 1/267]
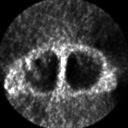
[im 54/267]
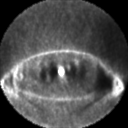
[im 107/267]
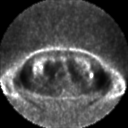
[im 160/267]
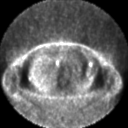
[im 213/267]
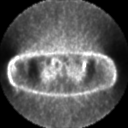
[im 267/267]
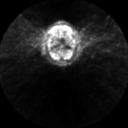

[Series 123: mip · coronal · 3.3mm · 4.69mm/px · 1 of 30 slices shown]
[im 1/30]
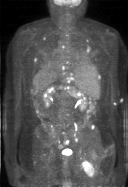

[Series 151: reformatted · axial · 3.3mm · 3.91mm/px · z∈[-870,+0]mm · 6 of 265 slices shown (1 of 2)]
[im 1/265]
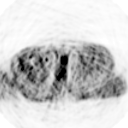
[im 53/265]
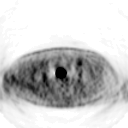
[im 106/265]
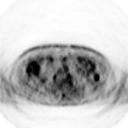
[im 159/265]
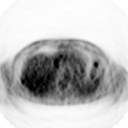
[im 212/265]
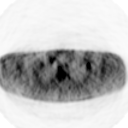
[im 265/265]
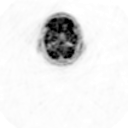

[Series 153: reformatted · coronal · 4.7mm · 6.98mm/px · 2 of 82 slices shown (2 of 2)]
[im 1/82]
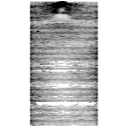
[im 82/82]
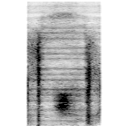

[25 of 25 positions shown; findings below may reference images not displayed]

FINDINGS: Neck: No hypermetabolic lymph nodes in the neck.

Chest:  Stable right upper lobe 5 mm nodule (series 2/image 77).
Additional 5 mm subpleural nodule in the right middle lobe (series
2/image 88).  Both are beneath the size threshold for PET
sensitivity.

Suspected nodular scarring in the lingula (series 2/image 79), new.
Patchy bilateral lower lobe opacities, likely atelectasis

No hypermetabolic mediastinal or hilar nodes.

Abdomen/Pelvis:  Although vague/difficult to discretely visualize
on CT, there are two suspected lesions in the lateral segment left
hepatic lobe:
--Superiorly in the left hepatic dome (series 2/image 110), max SUV
9.5
--Lateral segment left hepatic lobe (series 2/image 113), max SUV
12.6

Soft tissue mass in the proximal transverse colon (series 2/image
135), corresponding to tubular adenoma on biopsy, max SUV 11.3.
Additional scattered areas of GI uptake are likely physiologic.

No hypermetabolic lymph nodes in the abdomen or pelvis.

Skeleton:  Destructive lesion in the right proximal femur with
associated pathologic fracture, status post ORIF, max SUV 17.7 (PET
image 239).  This corresponds to known metastatic carcinoma.

Additional widespread osseous metastases, including:
--Right skull base/clival metastasis, max SUV 10.0 (PET image 9)
--Left sternal metastasis, max SUV 13.9 (PET image 68)
--Multiple rib metastases, including the right 8th rib, max SUV
(PET image 111)
--Multiple vertebral metastases, including the right T11 vertebral
body, max SUV 13.3 (PET image 117)
--Multiple pelvic metastases, including the right iliac crest, max
SUV 15.8 (PET image 183)
IMPRESSION: Two ill-defined lesions in the lateral segment left hepatic lobe,
max SUV 12.6, possibly reflecting the site of primary tumor (versus
hepatic metastases).  MRI abdomen with/without contrast is
suggested for further evaluation.

Status post ORIF of pathologic right femur fracture, corresponding
to known metastatic carcinoma.  Additional widespread osseous
metastases, as described above.

Soft tissue mass in the proximal transverse colon, max SUV 11.3,
corresponding to tubular adenoma on biopsy.

Stable 5 mm right upper and middle lobe nodules, beneath the size
threshold for PET sensitivity.  Attention on follow-up is
suggested.

## 2013-12-08 ENCOUNTER — Other Ambulatory Visit: Payer: Self-pay | Admitting: *Deleted

## 2013-12-08 ENCOUNTER — Encounter: Payer: Self-pay | Admitting: *Deleted
